# Patient Record
Sex: Male | Born: 1960 | Race: White | Hispanic: No | Smoking: Former smoker
Health system: Southern US, Community
[De-identification: ages and names within clinical notes are randomized; demographics above are authoritative.]

## PROBLEM LIST (undated history)

## (undated) DIAGNOSIS — K279 Peptic ulcer, site unspecified, unspecified as acute or chronic, without hemorrhage or perforation: Secondary | ICD-10-CM

## (undated) DIAGNOSIS — J45909 Unspecified asthma, uncomplicated: Secondary | ICD-10-CM

## (undated) DIAGNOSIS — I671 Cerebral aneurysm, nonruptured: Secondary | ICD-10-CM

## (undated) DIAGNOSIS — C3490 Malignant neoplasm of unspecified part of unspecified bronchus or lung: Secondary | ICD-10-CM

## (undated) HISTORY — DX: Malignant neoplasm of unspecified part of unspecified bronchus or lung: C34.90

## (undated) HISTORY — DX: Peptic ulcer, site unspecified, unspecified as acute or chronic, without hemorrhage or perforation: K27.9

## (undated) HISTORY — PX: BRONCHOSCOPY: SUR163

## (undated) HISTORY — DX: Unspecified asthma, uncomplicated: J45.909

---

## 2005-04-19 ENCOUNTER — Encounter: Payer: Self-pay | Admitting: Emergency Medicine

## 2005-04-19 ENCOUNTER — Inpatient Hospital Stay (HOSPITAL_COMMUNITY): Admission: EM | Admit: 2005-04-19 | Discharge: 2005-05-01 | Payer: Self-pay | Admitting: Neurosurgery

## 2005-05-23 ENCOUNTER — Ambulatory Visit (HOSPITAL_COMMUNITY): Admission: RE | Admit: 2005-05-23 | Discharge: 2005-05-23 | Payer: Self-pay | Admitting: Neurosurgery

## 2005-12-26 IMAGING — CT CT HEAD W/O CM
1 series · 16 of 30 positions shown, 20 images · IV contrast (agent unspecified)
Comparison: Comparison is made with 04/29/05 study.

CLINICAL DATA: Follow up subarachnoid hemorrhage.
 HEAD CT WITHOUT CONTRAST:
TECHNIQUE: 5mm collimated images were obtained from the base of the skull through the vertex according to standard protocol without contrast.

[Series 2: brain · axial · 0.47mm/px · z∈[+148,+284]mm · 16 of 30 slices shown, 20 images]
[im 2/30  brain]
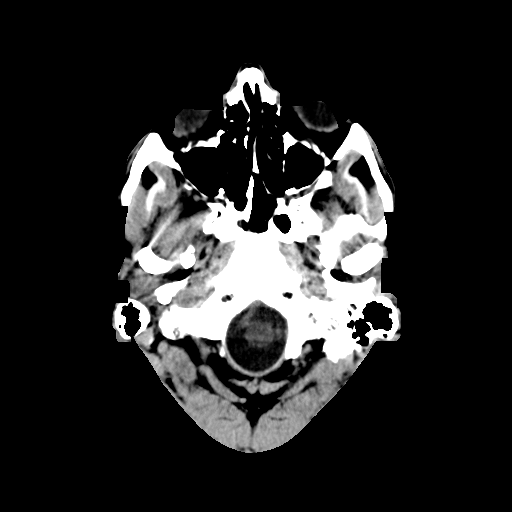
[im 2/30  bone]
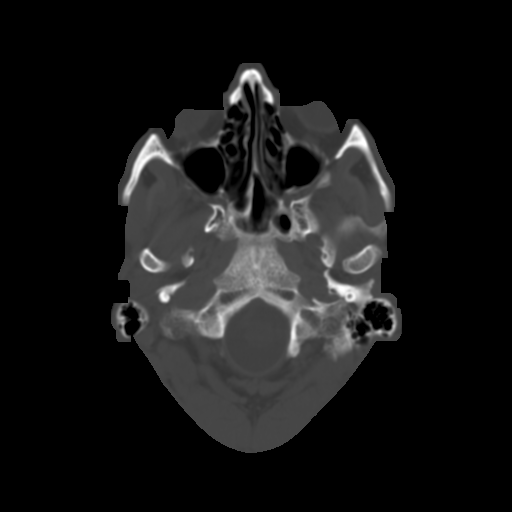
[im 4/30  brain]
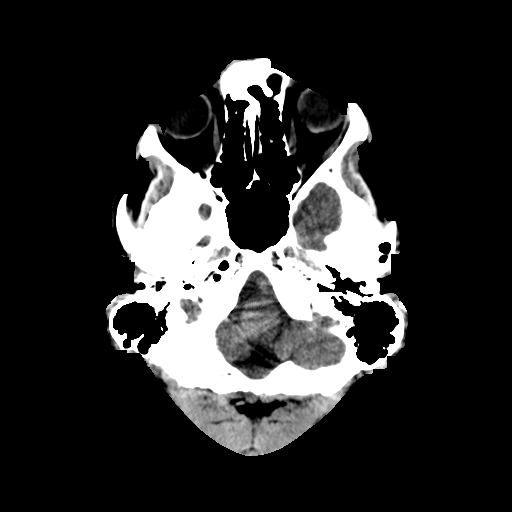
[im 6/30  brain]
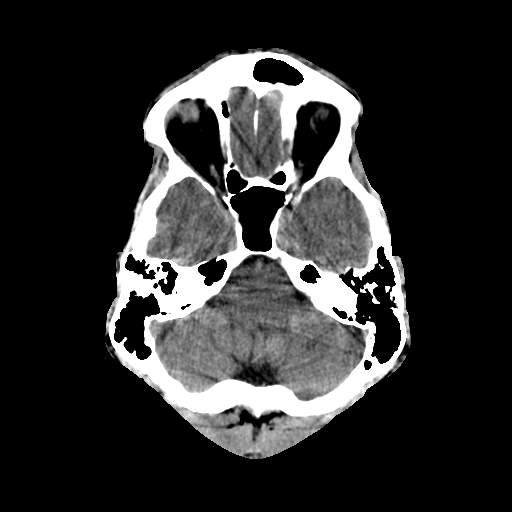
[im 8/30  brain]
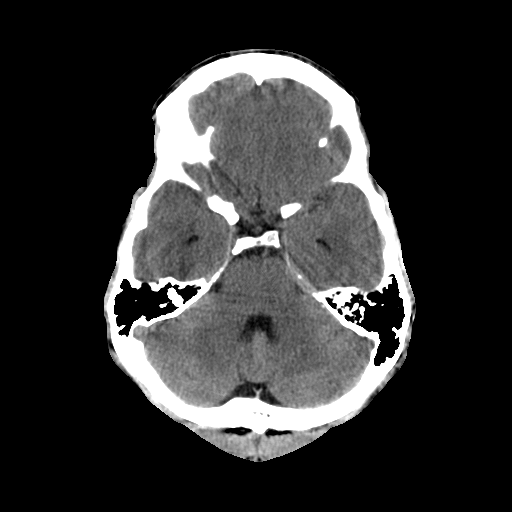
[im 9/30  brain]
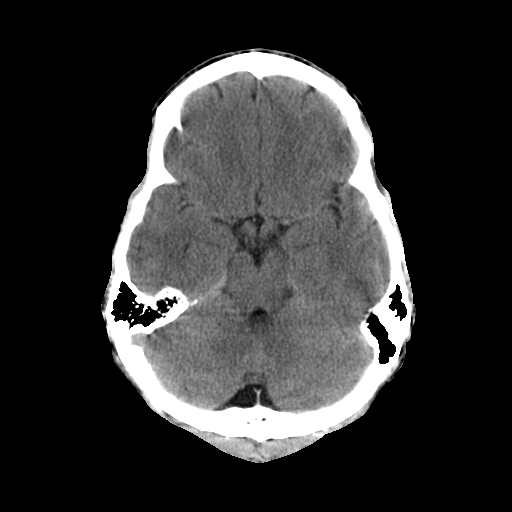
[im 9/30  bone]
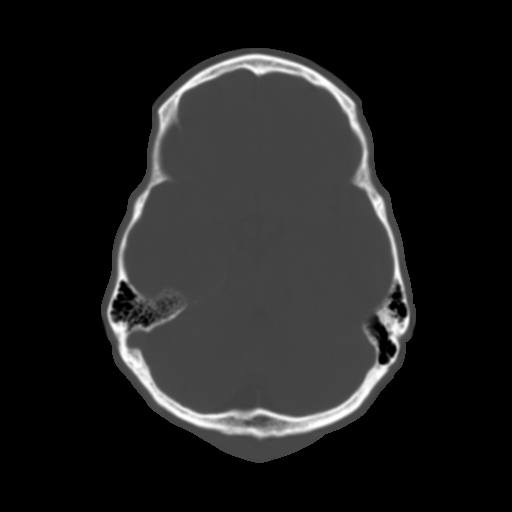
[im 11/30  brain]
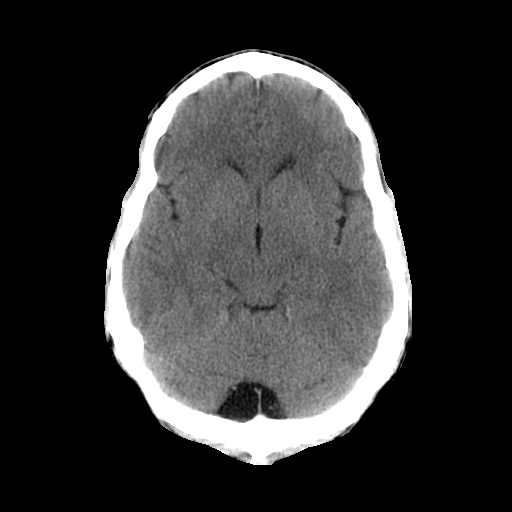
[im 13/30  brain]
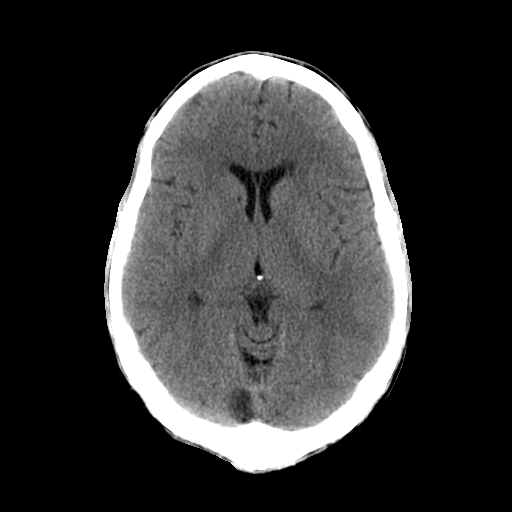
[im 15/30  brain]
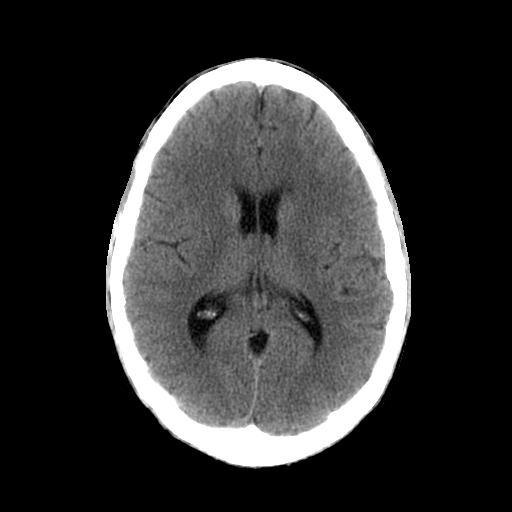
[im 16/30  brain]
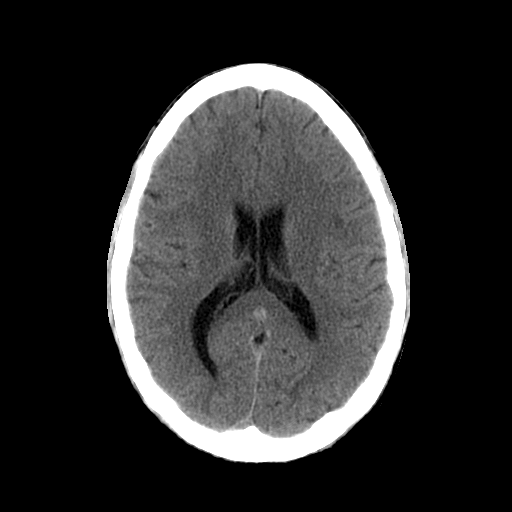
[im 16/30  bone]
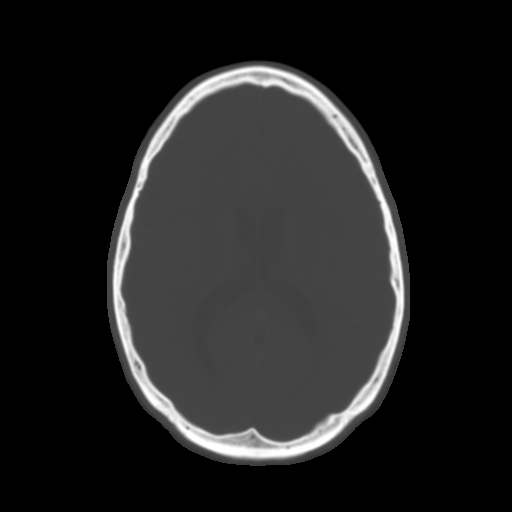
[im 18/30  brain]
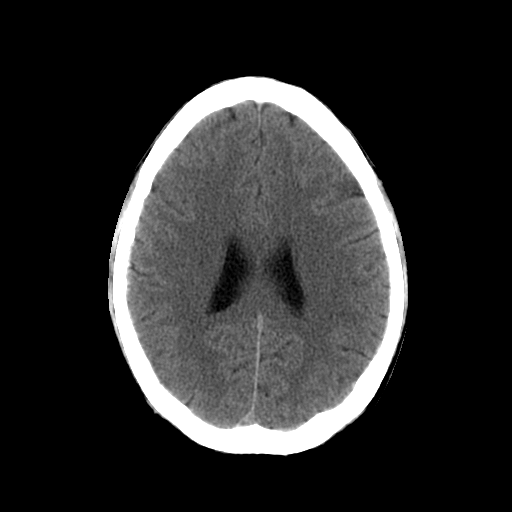
[im 20/30  brain]
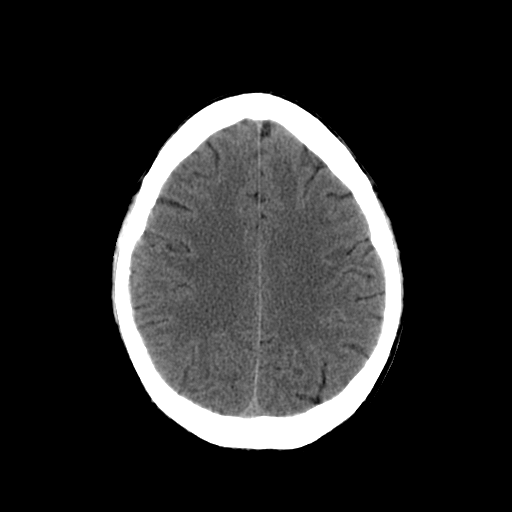
[im 22/30  brain]
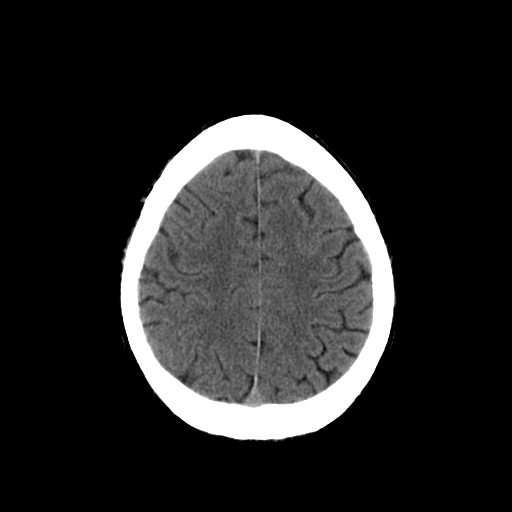
[im 23/30  brain]
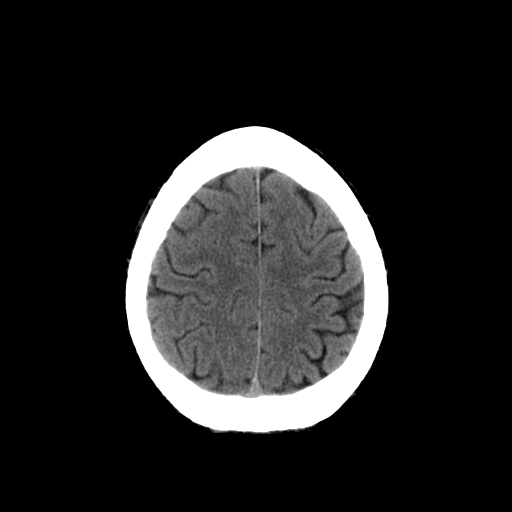
[im 23/30  bone]
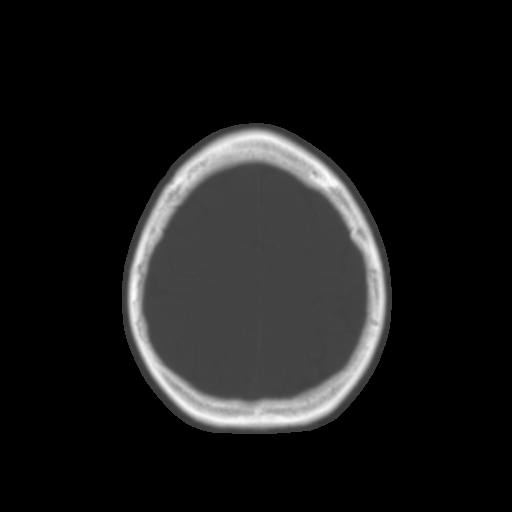
[im 25/30  brain]
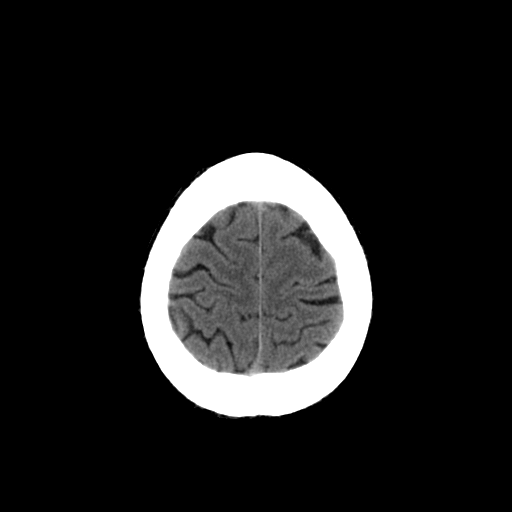
[im 27/30  brain]
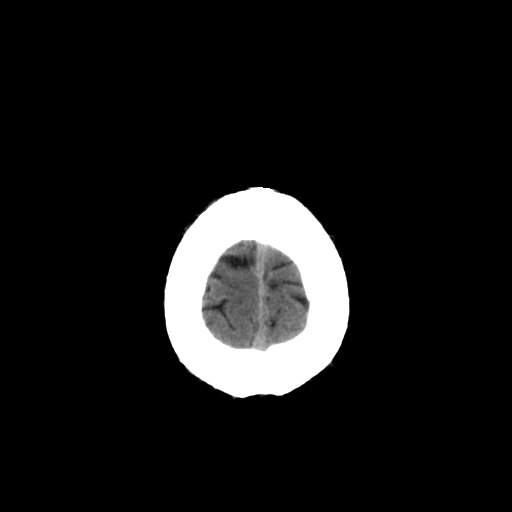
[im 29/30  brain]
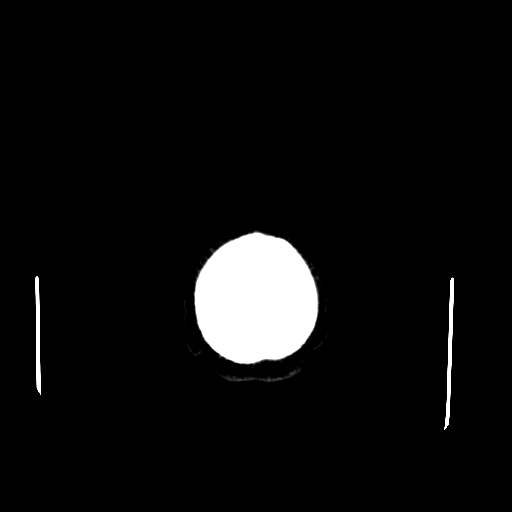

[16 of 30 positions shown; findings below may reference images not displayed]

There is no subarachnoid or intraventricular hemorrhage present on today?s study.  There is no midline shifts or mass effects.  The ventricles are normal in size and contour.  The paranasal sinuses as visualized have a normal appearance.
IMPRESSION: No evidence for recurrent subarachnoid or intraventricular hemorrhage.  No acute intracranial finding.

## 2019-05-11 ENCOUNTER — Emergency Department (HOSPITAL_COMMUNITY): Payer: Self-pay

## 2019-05-11 ENCOUNTER — Encounter (HOSPITAL_COMMUNITY): Payer: Self-pay | Admitting: Emergency Medicine

## 2019-05-11 ENCOUNTER — Inpatient Hospital Stay (HOSPITAL_COMMUNITY)
Admission: EM | Admit: 2019-05-11 | Discharge: 2019-05-17 | DRG: 871 | Disposition: A | Discharge status: Home / Self Care | Payer: Self-pay | Attending: Internal Medicine | Admitting: Internal Medicine

## 2019-05-11 ENCOUNTER — Other Ambulatory Visit: Payer: Self-pay

## 2019-05-11 DIAGNOSIS — Z20828 Contact with and (suspected) exposure to other viral communicable diseases: Secondary | ICD-10-CM | POA: Diagnosis present

## 2019-05-11 DIAGNOSIS — E46 Unspecified protein-calorie malnutrition: Secondary | ICD-10-CM

## 2019-05-11 DIAGNOSIS — A403 Sepsis due to Streptococcus pneumoniae: Principal | ICD-10-CM | POA: Diagnosis present

## 2019-05-11 DIAGNOSIS — Z6820 Body mass index (BMI) 20.0-20.9, adult: Secondary | ICD-10-CM

## 2019-05-11 DIAGNOSIS — R824 Acetonuria: Secondary | ICD-10-CM | POA: Diagnosis present

## 2019-05-11 DIAGNOSIS — E875 Hyperkalemia: Secondary | ICD-10-CM | POA: Diagnosis not present

## 2019-05-11 DIAGNOSIS — J9801 Acute bronchospasm: Secondary | ICD-10-CM

## 2019-05-11 DIAGNOSIS — E861 Hypovolemia: Secondary | ICD-10-CM | POA: Diagnosis present

## 2019-05-11 DIAGNOSIS — J181 Lobar pneumonia, unspecified organism: Secondary | ICD-10-CM

## 2019-05-11 DIAGNOSIS — J189 Pneumonia, unspecified organism: Secondary | ICD-10-CM | POA: Diagnosis present

## 2019-05-11 DIAGNOSIS — E860 Dehydration: Secondary | ICD-10-CM | POA: Diagnosis present

## 2019-05-11 DIAGNOSIS — F17210 Nicotine dependence, cigarettes, uncomplicated: Secondary | ICD-10-CM | POA: Diagnosis present

## 2019-05-11 DIAGNOSIS — R7881 Bacteremia: Secondary | ICD-10-CM

## 2019-05-11 DIAGNOSIS — E871 Hypo-osmolality and hyponatremia: Secondary | ICD-10-CM

## 2019-05-11 DIAGNOSIS — R319 Hematuria, unspecified: Secondary | ICD-10-CM | POA: Diagnosis present

## 2019-05-11 DIAGNOSIS — E43 Unspecified severe protein-calorie malnutrition: Secondary | ICD-10-CM | POA: Diagnosis present

## 2019-05-11 DIAGNOSIS — B953 Streptococcus pneumoniae as the cause of diseases classified elsewhere: Secondary | ICD-10-CM

## 2019-05-11 DIAGNOSIS — Z720 Tobacco use: Secondary | ICD-10-CM

## 2019-05-11 DIAGNOSIS — R809 Proteinuria, unspecified: Secondary | ICD-10-CM | POA: Diagnosis present

## 2019-05-11 DIAGNOSIS — J440 Chronic obstructive pulmonary disease with acute lower respiratory infection: Secondary | ICD-10-CM | POA: Diagnosis present

## 2019-05-11 HISTORY — DX: Cerebral aneurysm, nonruptured: I67.1

## 2019-05-11 LAB — CBC WITH DIFFERENTIAL/PLATELET
Abs Immature Granulocytes: 0.08 10*3/uL — ABNORMAL HIGH (ref 0.00–0.07)
Basophils Absolute: 0 10*3/uL (ref 0.0–0.1)
Basophils Relative: 0 %
Eosinophils Absolute: 0.1 10*3/uL (ref 0.0–0.5)
Eosinophils Relative: 1 %
HCT: 35 % — ABNORMAL LOW (ref 39.0–52.0)
Hemoglobin: 11.5 g/dL — ABNORMAL LOW (ref 13.0–17.0)
Immature Granulocytes: 1 %
Lymphocytes Relative: 10 %
Lymphs Abs: 1.6 10*3/uL (ref 0.7–4.0)
MCH: 29.6 pg (ref 26.0–34.0)
MCHC: 32.9 g/dL (ref 30.0–36.0)
MCV: 90 fL (ref 80.0–100.0)
Monocytes Absolute: 1 10*3/uL (ref 0.1–1.0)
Monocytes Relative: 6 %
Neutro Abs: 13.7 10*3/uL — ABNORMAL HIGH (ref 1.7–7.7)
Neutrophils Relative %: 82 %
Platelets: 274 10*3/uL (ref 150–400)
RBC: 3.89 MIL/uL — ABNORMAL LOW (ref 4.22–5.81)
RDW: 13.8 % (ref 11.5–15.5)
WBC: 16.5 10*3/uL — ABNORMAL HIGH (ref 4.0–10.5)
nRBC: 0 % (ref 0.0–0.2)

## 2019-05-11 LAB — COMPREHENSIVE METABOLIC PANEL
ALT: 17 U/L (ref 0–44)
AST: 21 U/L (ref 15–41)
Albumin: 2.6 g/dL — ABNORMAL LOW (ref 3.5–5.0)
Alkaline Phosphatase: 69 U/L (ref 38–126)
Anion gap: 12 (ref 5–15)
BUN: 15 mg/dL (ref 6–20)
CO2: 23 mmol/L (ref 22–32)
Calcium: 8.1 mg/dL — ABNORMAL LOW (ref 8.9–10.3)
Chloride: 94 mmol/L — ABNORMAL LOW (ref 98–111)
Creatinine, Ser: 1.14 mg/dL (ref 0.61–1.24)
GFR calc Af Amer: >60 mL/min (ref >60)
GFR calc non Af Amer: >60 mL/min (ref >60)
Glucose, Bld: 103 mg/dL — ABNORMAL HIGH (ref 70–99)
Potassium: 3.8 mmol/L (ref 3.5–5.1)
Sodium: 129 mmol/L — ABNORMAL LOW (ref 135–145)
Total Bilirubin: 1.3 mg/dL — ABNORMAL HIGH (ref 0.3–1.2)
Total Protein: 6.1 g/dL — ABNORMAL LOW (ref 6.5–8.1)

## 2019-05-11 LAB — URINALYSIS, ROUTINE W REFLEX MICROSCOPIC
Bacteria, UA: NONE SEEN
Bilirubin Urine: NEGATIVE
Glucose, UA: NEGATIVE mg/dL
Hgb urine dipstick: NEGATIVE
Ketones, ur: 5 mg/dL — AB
Leukocytes,Ua: NEGATIVE
Nitrite: NEGATIVE
Protein, ur: 30 mg/dL — AB
Specific Gravity, Urine: 1.021 (ref 1.005–1.030)
pH: 5 (ref 5.0–8.0)

## 2019-05-11 LAB — LACTIC ACID, PLASMA: Lactic Acid, Venous: 1.2 mmol/L (ref 0.5–1.9)

## 2019-05-11 LAB — LIPASE, BLOOD: Lipase: 30 U/L (ref 11–51)

## 2019-05-11 LAB — SARS CORONAVIRUS 2 BY RT PCR (HOSPITAL ORDER, PERFORMED IN ~~LOC~~ HOSPITAL LAB): SARS Coronavirus 2: NEGATIVE

## 2019-05-11 MED ORDER — ENOXAPARIN SODIUM 40 MG/0.4ML ~~LOC~~ SOLN
40.0000 mg | SUBCUTANEOUS | Status: DC
  Administered 2019-05-12 – 2019-05-16 (×5): 40 mg via SUBCUTANEOUS
  Filled 2019-05-11 (×6): qty 0.4

## 2019-05-11 MED ORDER — SODIUM CHLORIDE 0.9 % IV SOLN
1.0000 g | INTRAVENOUS | Status: DC
  Administered 2019-05-11: 1 g via INTRAVENOUS
  Filled 2019-05-11 (×2): qty 10

## 2019-05-11 MED ORDER — SODIUM CHLORIDE 0.9% FLUSH
3.0000 mL | Freq: Two times a day (BID) | INTRAVENOUS | Status: DC
  Administered 2019-05-11 – 2019-05-16 (×9): 3 mL via INTRAVENOUS

## 2019-05-11 MED ORDER — ACETAMINOPHEN 325 MG PO TABS
650.0000 mg | ORAL_TABLET | Freq: Four times a day (QID) | ORAL | Status: DC | PRN
  Administered 2019-05-12 – 2019-05-16 (×2): 650 mg via ORAL
  Filled 2019-05-11 (×2): qty 2

## 2019-05-11 MED ORDER — VANCOMYCIN HCL IN DEXTROSE 1-5 GM/200ML-% IV SOLN: 1000.0000 mg | Freq: Two times a day (BID) | INTRAVENOUS | Status: DC

## 2019-05-11 MED ORDER — VANCOMYCIN HCL IN DEXTROSE 1-5 GM/200ML-% IV SOLN: 1000.0000 mg | Freq: Once | INTRAVENOUS | Status: DC

## 2019-05-11 MED ORDER — SODIUM CHLORIDE 0.9 % IV BOLUS (SEPSIS)
1000.0000 mL | Freq: Once | INTRAVENOUS | Status: AC
  Administered 2019-05-11: 1000 mL via INTRAVENOUS

## 2019-05-11 MED ORDER — SENNOSIDES-DOCUSATE SODIUM 8.6-50 MG PO TABS: 1.0000 | ORAL_TABLET | Freq: Every evening | ORAL | Status: DC | PRN

## 2019-05-11 MED ORDER — MORPHINE SULFATE (PF) 2 MG/ML IV SOLN
2.0000 mg | Freq: Once | INTRAVENOUS | Status: AC
  Administered 2019-05-11: 2 mg via INTRAVENOUS
  Filled 2019-05-11: qty 1

## 2019-05-11 MED ORDER — VANCOMYCIN HCL 10 G IV SOLR
1500.0000 mg | Freq: Once | INTRAVENOUS | Status: AC
  Administered 2019-05-11: 1500 mg via INTRAVENOUS
  Filled 2019-05-11: qty 1500

## 2019-05-11 MED ORDER — METRONIDAZOLE IN NACL 5-0.79 MG/ML-% IV SOLN
500.0000 mg | Freq: Once | INTRAVENOUS | Status: AC
  Administered 2019-05-11: 500 mg via INTRAVENOUS
  Filled 2019-05-11: qty 100

## 2019-05-11 MED ORDER — SODIUM CHLORIDE 0.9 % IV SOLN
2.0000 g | Freq: Once | INTRAVENOUS | Status: AC
  Administered 2019-05-11: 2 g via INTRAVENOUS
  Filled 2019-05-11: qty 2

## 2019-05-11 MED ORDER — SODIUM CHLORIDE 0.9 % IV BOLUS (SEPSIS)
500.0000 mL | Freq: Once | INTRAVENOUS | Status: AC
  Administered 2019-05-11: 500 mL via INTRAVENOUS

## 2019-05-11 MED ORDER — SODIUM CHLORIDE 0.9 % IV SOLN
500.0000 mg | INTRAVENOUS | Status: DC
  Administered 2019-05-11 – 2019-05-12 (×2): 500 mg via INTRAVENOUS
  Filled 2019-05-11 (×3): qty 500

## 2019-05-11 MED ORDER — ONDANSETRON HCL 4 MG/2ML IJ SOLN: 4.0000 mg | Freq: Four times a day (QID) | INTRAMUSCULAR | Status: DC | PRN

## 2019-05-11 MED ORDER — ACETAMINOPHEN 650 MG RE SUPP: 650.0000 mg | Freq: Four times a day (QID) | RECTAL | Status: DC | PRN

## 2019-05-11 MED ORDER — SODIUM CHLORIDE 0.9% FLUSH
3.0000 mL | INTRAVENOUS | Status: DC | PRN
  Administered 2019-05-16: 3 mL via INTRAVENOUS
  Filled 2019-05-11: qty 3

## 2019-05-11 MED ORDER — KETOROLAC TROMETHAMINE 15 MG/ML IJ SOLN
15.0000 mg | Freq: Four times a day (QID) | INTRAMUSCULAR | Status: DC | PRN
  Administered 2019-05-14: 15 mg via INTRAVENOUS
  Filled 2019-05-11: qty 1

## 2019-05-11 MED ORDER — SODIUM CHLORIDE 0.9 % IV SOLN: 2.0000 g | Freq: Three times a day (TID) | INTRAVENOUS | Status: DC

## 2019-05-11 MED ORDER — SODIUM CHLORIDE 0.9 % IV SOLN: 250.0000 mL | INTRAVENOUS | Status: DC | PRN

## 2019-05-11 MED ORDER — ONDANSETRON HCL 4 MG PO TABS: 4.0000 mg | ORAL_TABLET | Freq: Four times a day (QID) | ORAL | Status: DC | PRN

## 2019-05-11 MED ORDER — HYDROCODONE-ACETAMINOPHEN 5-325 MG PO TABS
1.0000 | ORAL_TABLET | ORAL | Status: DC | PRN
  Administered 2019-05-11 – 2019-05-14 (×3): 2 via ORAL
  Administered 2019-05-14: 1 via ORAL
  Administered 2019-05-14 – 2019-05-15 (×2): 2 via ORAL
  Administered 2019-05-16: 1 via ORAL
  Administered 2019-05-16: 2 via ORAL
  Filled 2019-05-11 (×4): qty 2
  Filled 2019-05-11 (×2): qty 1
  Filled 2019-05-11 (×2): qty 2

## 2019-05-11 NOTE — ED Provider Notes (Signed)
[facility] EMERGENCY DEPARTMENT Provider Note   CSN: [id number] Arrival date & time: 05/11/19  1603    History   Chief Complaint Chief Complaint  Patient presents with  . Fall    HPI Randall Valencia is a 58 y.o. male.     HPI  58 year old male presents via [facility] with reports of fall several days ago and fever.  Patient states he fell into a hole while working.  He states 1 leg went to the whole and the other leg stayed up.  He has had pain in his groin and the back of the other leg with standing since that time.  He states that he has been mostly staying in his truck since that time due to pain with walking.  He has been drinking lots of water.  He states he went to a friend's house today and they called EMS due to his pain.  He had not noted that he had a fever but EMS had noted a fever prehospital.  He has a cough with some sputum production.  He states that he is a smoker and often has a cough.  He denies any other infectious symptoms.  He states that he has had an aneurysm in the past but denies any current headache or head injury.  He denies any vision change, neck pain, chest pain, abdominal pain, nausea, vomiting, or diarrhea.  He states he has had some blood in his urine.  Denies any exposure to COVID-19, or travel.  No past medical history on file.  There are no active problems to display for this patient.  ho cerebral aneurysm   Home Medications    Prior to Admission medications   Not on File    Family History No family history on file.  Social History Social History   Tobacco Use  . Smoking status: Current Every Day Smoker  Substance Use Topics  . Alcohol use: Not on file  . Drug use: Not on file     Allergies   Patient has no allergy information on record.   Review of Systems Review of Systems  All other systems reviewed and are negative.    Physical Exam Updated Vital Signs Ht 1.676 m (5\' 6" )   SpO2 99%    Physical Exam Vitals signs and nursing note reviewed.  Constitutional:      General: He is not in acute distress.    Appearance: Normal appearance. He is normal weight. He is not ill-appearing.  HENT:     Head: Normocephalic and atraumatic.     Right Ear: External ear normal.     Left Ear: External ear normal.     Mouth/Throat:     Mouth: Mucous membranes are moist.     Pharynx: Oropharynx is clear.  Eyes:     Extraocular Movements: Extraocular movements intact.     Pupils: Pupils are equal, round, and reactive to light.  Neck:     Musculoskeletal: Normal range of motion and neck supple.  Cardiovascular:     Rate and Rhythm: Normal rate and regular rhythm.  Pulmonary:     Effort: Pulmonary effort is normal.     Breath sounds: Normal breath sounds.  Abdominal:     General: Abdomen is flat.     Palpations: Abdomen is soft.  Musculoskeletal: Normal range of motion.        General: Tenderness present. No swelling or deformity.  Skin:    General: Skin is warm.  Capillary Refill: Capillary refill takes less than 2 seconds.  Neurological:     General: No focal deficit present.     Mental Status: He is alert.     Comments: Patient oriented to place and year but not to month or day      ED Treatments / Results  Labs (all labs ordered are listed, but only abnormal results are displayed) Labs Reviewed  CULTURE, BLOOD (ROUTINE X 2)  CULTURE, BLOOD (ROUTINE X 2)  SARS CORONAVIRUS 2 (HOSPITAL ORDER, [facility])  LACTIC ACID, PLASMA  LACTIC ACID, PLASMA  COMPREHENSIVE METABOLIC PANEL  CBC WITH DIFFERENTIAL/PLATELET  URINALYSIS, ROUTINE W REFLEX MICROSCOPIC  LIPASE, BLOOD    EKG None  Radiology Dg Pelvis Portable  Result Date: 05/11/2019 CLINICAL DATA:  Low back and right leg pain following a fall 5 days ago. EXAM: PORTABLE PELVIS 1-2 VIEWS COMPARISON:  None. FINDINGS: Normal appearing pelvic bones and hips with no fracture or dislocation.  Lower lumbar spine degenerative changes and mild scoliosis. IMPRESSION: No fracture or dislocation. Electronically Signed   By: Claudie Revering M.D.   On: 05/11/2019 17:15   Dg Chest Port 1 View  Result Date: 05/11/2019 CLINICAL DATA:  Cough and fever. Recent fall. EXAM: PORTABLE CHEST 1 VIEW COMPARISON:  04/19/2005. FINDINGS: Normal sized heart. Patchy airspace opacity and pleural fluid at the left lung base. Clear right lung. Unremarkable bones. IMPRESSION: Left basilar pneumonia and small left pleural effusion. Electronically Signed   By: Claudie Revering M.D.   On: 05/11/2019 17:14   Dg Femur Min 2 Views Left  Result Date: 05/11/2019 CLINICAL DATA:  Pain secondary to a fall 3 days ago. EXAM: LEFT FEMUR 2 VIEWS COMPARISON:  None. FINDINGS: There is no evidence of fracture or other focal bone lesions. Soft tissues are unremarkable. IMPRESSION: Negative. Electronically Signed   By: Lorriane Shire M.D.   On: 05/11/2019 17:26   Dg Femur Min 2 Views Right  Result Date: 05/11/2019 CLINICAL DATA:  Low back and right leg pain following a fall 5 days ago. EXAM: RIGHT FEMUR 2 VIEWS COMPARISON:  None. FINDINGS: There is no evidence of fracture or other focal bone lesions. Soft tissues are unremarkable. IMPRESSION: No fracture. Electronically Signed   By: Claudie Revering M.D.   On: 05/11/2019 17:20    Procedures Procedures (including critical care time)  Medications Ordered in ED Medications  ceFEPIme (MAXIPIME) 2 g in sodium chloride 0.9 % 100 mL IVPB (has no administration in time range)  metroNIDAZOLE (FLAGYL) IVPB 500 mg (has no administration in time range)  vancomycin (VANCOCIN) IVPB 1000 mg/200 mL premix (has no administration in time range)  sodium chloride 0.9 % bolus 1,000 mL (has no administration in time range)    And  sodium chloride 0.9 % bolus 1,000 mL (has no administration in time range)    And  sodium chloride 0.9 % bolus 500 mL (has no administration in time range)     Initial Impression  / Assessment and Plan / ED Course  I have reviewed the triage vital signs and the nursing notes.  Pertinent labs & imaging results that were available during my care of the patient were reviewed by me and considered in my medical decision making (see chart for details).    58 year old male who presents today complaining of fall several days ago, generalized weakness, cough and dyspnea.  Here on evaluation patient is hemodynamically stable with some tachypnea with normal oxygen saturations.  Work-up revealed  a level of left lower lobe infiltrate, leukocytosis, hyponatremia.  He is receiving IV fluids and broad-spectrum antibiotics. He was evaluated due to the fall with x-rays of his pelvis, bilateral femurs, head CT, and CT of lumbar spine. 1 left lower lobe pneumonia 2 2 follow-up with back and leg pain 3 history of aneurysm no evidence of acute bleed on head CT Final Clinical Impressions(s) / ED Diagnoses   Final diagnoses:  None    ED Discharge Orders    None       Pattricia Boss, MD 05/11/19 2343

## 2019-05-11 NOTE — ED Triage Notes (Signed)
Pt arrives via EMS from home with reports of fall 5 days ago. Pain to lower back and right leg. Endorses blood in urine and states he has not eaten. EMS reports fever of 100.7. 50 mcg fentanyl given. Pt states he has been in his truck for the past 3 days cause he was unable to get out.

## 2019-05-11 NOTE — H&P (Signed)
History and Physical    Riaz Onorato WUJ:[id number] DOB: 12-19-1960 DOA: 05/11/2019  PCP: Patient, No Pcp Per   Patient coming from: Home   Chief Complaint: Fall with back and leg pain, malaise, fever   HPI: Dionte Blaustein is a 58 y.o. male with medical history significant for cerebral aneurysm, reports that he has not seen a physician in several years, and now presents for evaluation of generalized weakness, malaise, fever, and pain in his low back and right leg after a fall a few days ago.  The patient reports a chronic cough that he attributes to smoking, but has developed some shortness of breath over the past few days.  His main concern is generalized weakness and loss of appetite.  He denies any headache, change in vision or hearing, or focal numbness or weakness.  Denies any alcohol use.  He fell approximately 5 days ago, states that he felt generally weak, but does not believe that he hit his head or loss consciousness.  Weakness and malaise has continued to worsen, prompting his presentation tonight.  ED Course: Upon arrival to the ED, patient is found to be tachypneic, saturating adequately on room air, and with stable blood pressure.  EKG features a sinus rhythm and chest x-ray is concerning for left basilar pneumonia with small pleural effusion.  Noncontrast head CT is negative for acute intracranial abnormality and CT lumbar spine is also negative for acute pathology.  Radiographs of the bilateral femurs and pelvis are negative.  Chemistry panel is notable for sodium of 129 and albumin 2.6.  CBC features a leukocytosis to 16,500.  Urinalysis notable for proteinuria and ketonuria.  Lactic acid is reassuringly normal.  Blood cultures were collected and the patient was given 30 cc/kg normal saline bolus, vancomycin, cefepime, and Flagyl in the ED.  He remains hemodynamically stable and will be observed for ongoing evaluation and management of pneumonia.  Review of Systems:  All other systems  reviewed and apart from HPI, are negative.  Past Medical History:  Diagnosis Date  . Cerebral aneurysm     History reviewed. No pertinent surgical history.   reports that he has been smoking. He has never used smokeless tobacco. He reports previous alcohol use. He reports previous drug use.  Not on File  History reviewed. No pertinent family history.   Prior to Admission medications   Not on File    Physical Exam: Vitals:   05/11/19 1731 05/11/19 1745 05/11/19 1800 05/11/19 1845  BP:  119/63 (!) 129/111 111/66  Pulse:   92 88  Resp:  (!) 26 (!) 27 (!) 24  SpO2:   98% 98%  Weight: 77.1 kg     Height: 6\' 2"  (1.88 m)       Constitutional: NAD, cachectic   Eyes: PERTLA, lids and conjunctivae normal ENMT: Mucous membranes are moist. Posterior pharynx clear of any exudate or lesions.   Neck: normal, supple, no masses, no thyromegaly Respiratory: Rhonchi on left. no wheezing, no crackles.  No accessory muscle use.  Cardiovascular: S1 & S2 heard, regular rate and rhythm. No extremity edema.  Abdomen: No distension, no tenderness, soft. Bowel sounds normal.  Musculoskeletal: no clubbing / cyanosis. No joint deformity upper and lower extremities.    Skin: no significant rashes, lesions, ulcers. Warm, dry, well-perfused. Neurologic: CN 2-12 grossly intact. Sensation intact. Strength 5/5 in all 4 limbs.  Psychiatric: Alert and oriented to person, place, and situation. Pleasant, cooperative.    Labs on Admission: I have personally  reviewed following labs and imaging studies  CBC: Recent Labs  Lab 05/11/19 1700  WBC 16.5*  NEUTROABS 13.7*  HGB 11.5*  HCT 35.0*  MCV 90.0  PLT 756   Basic Metabolic Panel: Recent Labs  Lab 05/11/19 1700  NA 129*  K 3.8  CL 94*  CO2 23  GLUCOSE 103*  BUN 15  CREATININE 1.14  CALCIUM 8.1*   GFR: Estimated Creatinine Clearance: 76.1 mL/min (by C-G formula based on SCr of 1.14 mg/dL). Liver Function Tests: Recent Labs  Lab  05/11/19 1700  AST 21  ALT 17  ALKPHOS 69  BILITOT 1.3*  PROT 6.1*  ALBUMIN 2.6*   Recent Labs  Lab 05/11/19 1700  LIPASE 30   No results for input(s): AMMONIA in the last 168 hours. Coagulation Profile: No results for input(s): INR, PROTIME in the last 168 hours. Cardiac Enzymes: No results for input(s): CKTOTAL, CKMB, CKMBINDEX, TROPONINI in the last 168 hours. BNP (last 3 results) No results for input(s): PROBNP in the last 8760 hours. HbA1C: No results for input(s): HGBA1C in the last 72 hours. CBG: No results for input(s): GLUCAP in the last 168 hours. Lipid Profile: No results for input(s): CHOL, HDL, LDLCALC, TRIG, CHOLHDL, LDLDIRECT in the last 72 hours. Thyroid Function Tests: No results for input(s): TSH, T4TOTAL, FREET4, T3FREE, THYROIDAB in the last 72 hours. Anemia Panel: No results for input(s): VITAMINB12, FOLATE, FERRITIN, TIBC, IRON, RETICCTPCT in the last 72 hours. Urine analysis:    Component Value Date/Time   COLORURINE AMBER (A) 05/11/2019 1745   APPEARANCEUR CLEAR 05/11/2019 1745   LABSPEC 1.021 05/11/2019 1745   PHURINE 5.0 05/11/2019 1745   GLUCOSEU NEGATIVE 05/11/2019 1745   HGBUR NEGATIVE 05/11/2019 1745   BILIRUBINUR NEGATIVE 05/11/2019 1745   KETONESUR 5 (A) 05/11/2019 1745   PROTEINUR 30 (A) 05/11/2019 1745   NITRITE NEGATIVE 05/11/2019 1745   LEUKOCYTESUR NEGATIVE 05/11/2019 1745   Sepsis Labs: @LABRCNTIP (procalcitonin:4,lacticidven:4) ) Recent Results (from the past 240 hour(s))  SARS Coronavirus 2 (CEPHEID- Performed in [facility] hospital lab), Hosp Order     Status: None   Collection Time: 05/11/19  5:02 PM  Result Value Ref Range Status   SARS Coronavirus 2 NEGATIVE NEGATIVE Final    Comment: (NOTE) If result is NEGATIVE SARS-CoV-2 target nucleic acids are NOT DETECTED. The SARS-CoV-2 RNA is generally detectable in upper and lower  respiratory specimens during the acute phase of infection. The lowest  concentration of  SARS-CoV-2 viral copies this assay can detect is 250  copies / mL. A negative result does not preclude SARS-CoV-2 infection  and should not be used as the sole basis for treatment or other  patient management decisions.  A negative result may occur with  improper specimen collection / handling, submission of specimen other  than nasopharyngeal swab, presence of viral mutation(s) within the  areas targeted by this assay, and inadequate number of viral copies  (<250 copies / mL). A negative result must be combined with clinical  observations, patient history, and epidemiological information. If result is POSITIVE SARS-CoV-2 target nucleic acids are DETECTED. The SARS-CoV-2 RNA is generally detectable in upper and lower  respiratory specimens dur ing the acute phase of infection.  Positive  results are indicative of active infection with SARS-CoV-2.  Clinical  correlation with patient history and other diagnostic information is  necessary to determine patient infection status.  Positive results do  not rule out bacterial infection or co-infection with other viruses. If result is PRESUMPTIVE POSTIVE  SARS-CoV-2 nucleic acids MAY BE PRESENT.   A presumptive positive result was obtained on the submitted specimen  and confirmed on repeat testing.  While 2019 novel coronavirus  (SARS-CoV-2) nucleic acids may be present in the submitted sample  additional confirmatory testing may be necessary for epidemiological  and / or clinical management purposes  to differentiate between  SARS-CoV-2 and other Sarbecovirus currently known to infect humans.  If clinically indicated additional testing with an alternate test  methodology [phone]) is advised. The SARS-CoV-2 RNA is generally  detectable in upper and lower respiratory sp ecimens during the acute  phase of infection. The expected result is Negative. Fact Sheet for Patients:  StrictlyIdeas.no Fact Sheet for Healthcare  Providers: BankingDealers.co.za This test is not yet approved or cleared by the [place] FDA and has been authorized for detection and/or diagnosis of SARS-CoV-2 by FDA under an Emergency Use Authorization (EUA).  This EUA will remain in effect (meaning this test can be used) for the duration of the COVID-19 declaration under Section 564(b)(1) of the Act, 21 U.S.C. section 360bbb-3(b)(1), unless the authorization is terminated or revoked sooner. Performed at [facility], [place] [address] [place], [place] [address]      Radiological Exams on Admission: Ct Head Wo Contrast  Result Date: 05/11/2019 CLINICAL DATA:  58 year old male with head trauma. EXAM: CT HEAD WITHOUT CONTRAST TECHNIQUE: Contiguous axial images were obtained from the base of the skull through the vertex without intravenous contrast. COMPARISON:  Head CT dated 05/23/2005 FINDINGS: Brain: The ventricles and sulci appropriate size for patient's age. Minimal periventricular and deep white matter chronic microvascular ischemic changes noted. There is no acute intracranial hemorrhage. No mass effect or midline shift. No extra-axial fluid collection. Vascular: No hyperdense vessel or unexpected calcification. Skull: Normal. Negative for fracture or focal lesion. Sinuses/Orbits: No acute finding. Other: None IMPRESSION: No acute intracranial pathology. Electronically Signed   By: Anner Crete M.D.   On: 05/11/2019 20:26   Ct Lumbar Spine Wo Contrast  Result Date: 05/11/2019 CLINICAL DATA:  Low back and right leg pain following a fall 5 days ago. Gross hematuria. Fever. EXAM: CT LUMBAR SPINE WITHOUT CONTRAST TECHNIQUE: Multidetector CT imaging of the lumbar spine was performed without intravenous contrast administration. Multiplanar CT image reconstructions were also generated. COMPARISON:  Lumbar spine radiographs dated 07/03/2010. FINDINGS: Segmentation: 5 non-rib-bearing lumbar vertebrae. Alignment:  Stable mild retrolisthesis at the L2-3 level and L3-4 level and mild retrolisthesis at the L5-S1 level. Interval minimal anterolisthesis at the L4-5 level. Mild levoconvex scoliosis in the lower lumbar spine. Vertebrae: No acute fracture or focal pathologic process. Paraspinal and other soft tissues: Unremarkable. A small left pleural effusion is noted. Disc levels: T11-12: Unremarkable. T12-L1: Unremarkable. L1-2: Mild diffuse, concentric disc protrusion without significant canal or foraminal stenosis. L2-3: Mild diffuse, concentric disc protrusion without significant canal or foraminal stenosis. L3-4: Mild diffuse concentric disc protrusion and associated spur formation. Facet spur formation on the left, causing mild to moderate lateral recess stenosis on the left. L4-5: Mild-to-moderate diffuse, concentric disc protrusion, mild bilateral ligamentum flavum hypertrophy, moderate facet hypertrophy on the right and mild facet hypertrophy on the left. These changes are producing mild canal stenosis, moderate to marked foraminal stenosis on the right and moderate foraminal stenosis on the left. L5-S1: Mild to moderate diffuse, concentric disc protrusion and associated spur formation, extending more toward the right than toward the left posteriorly in the spinal canal. There is also bilateral facet hypertrophy. These changes are producing  marked foraminal stenosis on the right and moderate foraminal stenosis on the left. IMPRESSION: 1. No acute abnormality. 2. Multilevel degenerative changes, as described above. 3. Small left pleural effusion. Electronically Signed   By: Claudie Revering M.D.   On: 05/11/2019 20:34   Dg Pelvis Portable  Result Date: 05/11/2019 CLINICAL DATA:  Low back and right leg pain following a fall 5 days ago. EXAM: PORTABLE PELVIS 1-2 VIEWS COMPARISON:  None. FINDINGS: Normal appearing pelvic bones and hips with no fracture or dislocation. Lower lumbar spine degenerative changes and mild scoliosis.  IMPRESSION: No fracture or dislocation. Electronically Signed   By: Claudie Revering M.D.   On: 05/11/2019 17:15   Dg Chest Port 1 View  Result Date: 05/11/2019 CLINICAL DATA:  Cough and fever. Recent fall. EXAM: PORTABLE CHEST 1 VIEW COMPARISON:  04/19/2005. FINDINGS: Normal sized heart. Patchy airspace opacity and pleural fluid at the left lung base. Clear right lung. Unremarkable bones. IMPRESSION: Left basilar pneumonia and small left pleural effusion. Electronically Signed   By: Claudie Revering M.D.   On: 05/11/2019 17:14   Dg Femur Min 2 Views Left  Result Date: 05/11/2019 CLINICAL DATA:  Pain secondary to a fall 3 days ago. EXAM: LEFT FEMUR 2 VIEWS COMPARISON:  None. FINDINGS: There is no evidence of fracture or other focal bone lesions. Soft tissues are unremarkable. IMPRESSION: Negative. Electronically Signed   By: Lorriane Shire M.D.   On: 05/11/2019 17:26   Dg Femur Min 2 Views Right  Result Date: 05/11/2019 CLINICAL DATA:  Low back and right leg pain following a fall 5 days ago. EXAM: RIGHT FEMUR 2 VIEWS COMPARISON:  None. FINDINGS: There is no evidence of fracture or other focal bone lesions. Soft tissues are unremarkable. IMPRESSION: No fracture. Electronically Signed   By: Claudie Revering M.D.   On: 05/11/2019 17:20    EKG: Independently reviewed. Sinus rhythm, rate 82, QTc 424 ms.   Assessment/Plan   1. Pneumonia  - Presents with malaise, fever, cough, and is found to have leukocytosis and CXR findings concerning for PNA  - Blood cultures were collected in the ED, 30 cc/kg NS bolus was given, and he was treated with cefepime, vancomycin, and Flagyl  - Lactic acid is reassuringly normal, BP has remained stable, COVID-19 negative  - Check sputum culture and strep pnuemo antigen, continue antibiotics with Rocephin and azithromycin, follow cultures and clinical course   2. Hyponatremia  - Serum sodium is 129 on admission in setting of hypovolemia and PNA; he is alert, denies headache, and  his gen weakness is likely secondary to PNA rather than hyponatremia - He was fluid-resuscitated in ED with 2.5 liters NS  - SLIV for now, repeat chem panel in am    3. Protein-calorie malnutrition  - Appears malnourished, albumin 2.6   - Dietary consulted    PPE: Mask, face shield  DVT prophylaxis: Lovenox  Code Status: Full  Family Communication: Discussed with patient  Consults: none Admission status: Observation     Vianne Bulls, MD [facility] Pager [phone]  If 7PM-7AM, please contact night-coverage [email or web address] Password [email or web address]  05/11/2019, 9:11 PM

## 2019-05-11 NOTE — Progress Notes (Signed)
Pharmacy Antibiotic Note  Randall Valencia is a 58 y.o. male admitted on 05/11/2019 with sepsis.  Pharmacy has been consulted for cefepime/vancomycin dosing. Per patient, weight roughly 77kg. Tmax 100.7 and endorses hematuria after fall 5 days ago. Scr currently 1.14 with CrCl 76.19mL/min  Plan: Cefepime 2g x1, followed by 2g q8h Vancomycin IV 1500mg  x1, followed by 1000mg  q12h Flagyl per MD Monitor renal function, culture data, LOT, and vanc levels as needed  Height: 5\' 6"  (167.6 cm) IBW/kg (Calculated) : 63.8  No data recorded.  No results for input(s): WBC, CREATININE, LATICACIDVEN, VANCOTROUGH, VANCOPEAK, VANCORANDOM, GENTTROUGH, GENTPEAK, GENTRANDOM, TOBRATROUGH, TOBRAPEAK, TOBRARND, AMIKACINPEAK, AMIKACINTROU, AMIKACIN in the last 168 hours.  CrCl cannot be calculated (No successful lab value found.).    Not on File  Antimicrobials this admission: Vanc 6/2 >> Cefepime 6/2 >> Flagyl 6/2 >>  Thank you for allowing pharmacy to be a part of this patient's care.  Janae Bridgeman, PharmD PGY1 Pharmacy Resident Phone: [phone] 05/11/2019 4:37 PM

## 2019-05-12 ENCOUNTER — Inpatient Hospital Stay (HOSPITAL_COMMUNITY): Payer: Self-pay

## 2019-05-12 DIAGNOSIS — J40 Bronchitis, not specified as acute or chronic: Secondary | ICD-10-CM

## 2019-05-12 DIAGNOSIS — J9801 Acute bronchospasm: Secondary | ICD-10-CM

## 2019-05-12 DIAGNOSIS — Z720 Tobacco use: Secondary | ICD-10-CM

## 2019-05-12 LAB — COMPREHENSIVE METABOLIC PANEL
ALT: 12 U/L (ref 0–44)
AST: 18 U/L (ref 15–41)
Albumin: 2.2 g/dL — ABNORMAL LOW (ref 3.5–5.0)
Alkaline Phosphatase: 61 U/L (ref 38–126)
Anion gap: 11 (ref 5–15)
BUN: 15 mg/dL (ref 6–20)
CO2: 21 mmol/L — ABNORMAL LOW (ref 22–32)
Calcium: 7.9 mg/dL — ABNORMAL LOW (ref 8.9–10.3)
Chloride: 102 mmol/L (ref 98–111)
Creatinine, Ser: 1.03 mg/dL (ref 0.61–1.24)
GFR calc Af Amer: >60 mL/min (ref >60)
GFR calc non Af Amer: >60 mL/min (ref >60)
Glucose, Bld: 88 mg/dL (ref 70–99)
Potassium: 4.2 mmol/L (ref 3.5–5.1)
Sodium: 134 mmol/L — ABNORMAL LOW (ref 135–145)
Total Bilirubin: 0.8 mg/dL (ref 0.3–1.2)
Total Protein: 5 g/dL — ABNORMAL LOW (ref 6.5–8.1)

## 2019-05-12 LAB — LACTATE DEHYDROGENASE: LDH: 109 U/L (ref 98–192)

## 2019-05-12 LAB — BLOOD CULTURE ID PANEL (REFLEXED)
Acinetobacter baumannii: NOT DETECTED
Candida albicans: NOT DETECTED
Candida glabrata: NOT DETECTED
Candida krusei: NOT DETECTED
Candida parapsilosis: NOT DETECTED
Candida tropicalis: NOT DETECTED
Enterobacter cloacae complex: NOT DETECTED
Enterobacteriaceae species: NOT DETECTED
Enterococcus species: NOT DETECTED
Escherichia coli: NOT DETECTED
Haemophilus influenzae: NOT DETECTED
Klebsiella oxytoca: NOT DETECTED
Klebsiella pneumoniae: NOT DETECTED
Listeria monocytogenes: NOT DETECTED
Methicillin resistance: NOT DETECTED
Neisseria meningitidis: NOT DETECTED
Proteus species: NOT DETECTED
Pseudomonas aeruginosa: NOT DETECTED
Serratia marcescens: NOT DETECTED
Staphylococcus aureus (BCID): NOT DETECTED
Staphylococcus species: DETECTED — AB
Streptococcus agalactiae: NOT DETECTED
Streptococcus pneumoniae: DETECTED — AB
Streptococcus pyogenes: NOT DETECTED
Streptococcus species: DETECTED — AB

## 2019-05-12 LAB — CBC WITH DIFFERENTIAL/PLATELET
Abs Immature Granulocytes: 0.06 10*3/uL (ref 0.00–0.07)
Basophils Absolute: 0 10*3/uL (ref 0.0–0.1)
Basophils Relative: 0 %
Eosinophils Absolute: 0.3 10*3/uL (ref 0.0–0.5)
Eosinophils Relative: 2 %
HCT: 33.2 % — ABNORMAL LOW (ref 39.0–52.0)
Hemoglobin: 10.9 g/dL — ABNORMAL LOW (ref 13.0–17.0)
Immature Granulocytes: 0 %
Lymphocytes Relative: 13 %
Lymphs Abs: 1.8 10*3/uL (ref 0.7–4.0)
MCH: 29.5 pg (ref 26.0–34.0)
MCHC: 32.8 g/dL (ref 30.0–36.0)
MCV: 90 fL (ref 80.0–100.0)
Monocytes Absolute: 0.9 10*3/uL (ref 0.1–1.0)
Monocytes Relative: 7 %
Neutro Abs: 11 10*3/uL — ABNORMAL HIGH (ref 1.7–7.7)
Neutrophils Relative %: 78 %
Platelets: 205 10*3/uL (ref 150–400)
RBC: 3.69 MIL/uL — ABNORMAL LOW (ref 4.22–5.81)
RDW: 14 % (ref 11.5–15.5)
WBC: 14.2 10*3/uL — ABNORMAL HIGH (ref 4.0–10.5)
nRBC: 0 % (ref 0.0–0.2)

## 2019-05-12 LAB — PROCALCITONIN: Procalcitonin: 0.61 ng/mL

## 2019-05-12 LAB — D-DIMER, QUANTITATIVE: D-Dimer, Quant: 2.85 ug/mL-FEU — ABNORMAL HIGH (ref 0.00–0.50)

## 2019-05-12 LAB — HIV ANTIBODY (ROUTINE TESTING W REFLEX): HIV Screen 4th Generation wRfx: NONREACTIVE

## 2019-05-12 LAB — STREP PNEUMONIAE URINARY ANTIGEN: Strep Pneumo Urinary Antigen: NEGATIVE

## 2019-05-12 LAB — PREALBUMIN: Prealbumin: <5 mg/dL — ABNORMAL LOW (ref 18–38)

## 2019-05-12 LAB — SEDIMENTATION RATE: Sed Rate: 42 mm/hr — ABNORMAL HIGH (ref 0–16)

## 2019-05-12 LAB — C-REACTIVE PROTEIN: CRP: 13.1 mg/dL — ABNORMAL HIGH (ref <1.0)

## 2019-05-12 LAB — FERRITIN: Ferritin: 508 ng/mL — ABNORMAL HIGH (ref 24–336)

## 2019-05-12 MED ORDER — SODIUM CHLORIDE 0.9 % IV SOLN
2.0000 g | INTRAVENOUS | Status: DC
  Administered 2019-05-12 – 2019-05-16 (×5): 2 g via INTRAVENOUS
  Filled 2019-05-12 (×6): qty 20

## 2019-05-12 MED ORDER — ENSURE ENLIVE PO LIQD
237.0000 mL | Freq: Two times a day (BID) | ORAL | Status: DC
  Administered 2019-05-12 – 2019-05-16 (×9): 237 mL via ORAL

## 2019-05-12 MED ORDER — IPRATROPIUM-ALBUTEROL 0.5-2.5 (3) MG/3ML IN SOLN
3.0000 mL | Freq: Four times a day (QID) | RESPIRATORY_TRACT | Status: DC
  Administered 2019-05-12 (×2): 3 mL via RESPIRATORY_TRACT
  Filled 2019-05-12 (×2): qty 3

## 2019-05-12 MED ORDER — IOHEXOL 350 MG/ML SOLN
100.0000 mL | Freq: Once | INTRAVENOUS | Status: AC | PRN
  Administered 2019-05-12: 100 mL via INTRAVENOUS

## 2019-05-12 MED ORDER — GUAIFENESIN-DM 100-10 MG/5ML PO SYRP
5.0000 mL | ORAL_SOLUTION | ORAL | Status: DC | PRN
  Administered 2019-05-12 – 2019-05-16 (×5): 5 mL via ORAL
  Filled 2019-05-12 (×6): qty 5

## 2019-05-12 MED ORDER — METHYLPREDNISOLONE SODIUM SUCC 40 MG IJ SOLR
40.0000 mg | Freq: Three times a day (TID) | INTRAMUSCULAR | Status: DC
  Administered 2019-05-12 – 2019-05-17 (×15): 40 mg via INTRAVENOUS
  Filled 2019-05-12 (×15): qty 1

## 2019-05-12 MED ORDER — IPRATROPIUM-ALBUTEROL 0.5-2.5 (3) MG/3ML IN SOLN
3.0000 mL | RESPIRATORY_TRACT | Status: DC | PRN
  Filled 2019-05-12: qty 3

## 2019-05-12 MED ORDER — BUDESONIDE 0.5 MG/2ML IN SUSP
0.5000 mg | Freq: Two times a day (BID) | RESPIRATORY_TRACT | Status: DC
  Administered 2019-05-12 – 2019-05-17 (×10): 0.5 mg via RESPIRATORY_TRACT
  Filled 2019-05-12 (×10): qty 2

## 2019-05-12 NOTE — ED Notes (Signed)
ED TO INPATIENT HANDOFF REPORT  ED Nurse Name and Phone #: Gretta Cool [phone]  S Name/Age/Gender Randall Valencia 58 y.o. male Room/Bed: 017C/017C  Code Status   Code Status: Full Code  Home/SNF/Other Home Patient oriented to: self, place, time and situation Is this baseline? Yes   Triage Complete: Triage complete  Chief Complaint FEVER,FALL  Triage Note Pt arrives via EMS from home with reports of fall 5 days ago. Pain to lower back and right leg. Endorses blood in urine and states he has not eaten. EMS reports fever of 100.7. 50 mcg fentanyl given. Pt states he has been in his truck for the past 3 days cause he was unable to get out.   Allergies No Known Allergies  Level of Care/Admitting Diagnosis ED Disposition    ED Disposition Condition [facility] Area: [place] [100100]  Level of Care: Med-Surg [16]  I expect the patient will be discharged within 24 hours: Yes  LOW acuity---Tx typically complete <24 hrs---ACUTE conditions typically can be evaluated <24 hours---LABS likely to return to acceptable levels <24 hours---IS near functional baseline---EXPECTED to return to current living arrangement---NOT newly hypoxic: Does not meet criteria for 5C-Observation unit  Covid Evaluation: Screening Protocol (No Symptoms)  Diagnosis: CAP (community acquired pneumonia) [503546]  Admitting Physician: Vianne Bulls [[id number]]  Attending Physician: Vianne Bulls [[id number]]  PT Class (Do Not Modify): Observation [104]  PT Acc Code (Do Not Modify): Observation [10022]       B Medical/Surgery History Past Medical History:  Diagnosis Date  . Cerebral aneurysm    History reviewed. No pertinent surgical history.   A IV Location/Drains/Wounds Patient Lines/Drains/Airways Status   Active Line/Drains/Airways    Name:   Placement date:   Placement time:   Site:   Days:   Peripheral IV 05/11/19 Right Antecubital   05/11/19    1630    Antecubital   1    Peripheral IV 05/11/19 Left Forearm   05/11/19    1640    Forearm   1          Intake/Output Last 24 hours  Intake/Output Summary (Last 24 hours) at 05/12/2019 0006 Last data filed at 05/11/2019 1823 Gross per 24 hour  Intake 2600 ml  Output -  Net 2600 ml    Labs/Imaging Results for orders placed or performed during the hospital encounter of 05/11/19 (from the past 48 hour(s))  Lactic acid, plasma     Status: None   Collection Time: 05/11/19  5:00 PM  Result Value Ref Range   Lactic Acid, Venous 1.2 0.5 - 1.9 mmol/L    Comment: Performed at [facility], [address], [place], [place] [address]  Comprehensive metabolic panel     Status: Abnormal   Collection Time: 05/11/19  5:00 PM  Result Value Ref Range   Sodium 129 (L) 135 - 145 mmol/L   Potassium 3.8 3.5 - 5.1 mmol/L   Chloride 94 (L) 98 - 111 mmol/L   CO2 23 22 - 32 mmol/L   Glucose, Bld 103 (H) 70 - 99 mg/dL   BUN 15 6 - 20 mg/dL   Creatinine, Ser 1.14 0.61 - 1.24 mg/dL   Calcium 8.1 (L) 8.9 - 10.3 mg/dL   Total Protein 6.1 (L) 6.5 - 8.1 g/dL   Albumin 2.6 (L) 3.5 - 5.0 g/dL   AST 21 15 - 41 U/L   ALT 17 0 - 44 U/L   Alkaline  Phosphatase 69 38 - 126 U/L   Total Bilirubin 1.3 (H) 0.3 - 1.2 mg/dL   GFR calc non Af Amer >60 >60 mL/min   GFR calc Af Amer >60 >60 mL/min   Anion gap 12 5 - 15    Comment: Performed at [facility] [address], [place], [place] [address]  CBC WITH DIFFERENTIAL     Status: Abnormal   Collection Time: 05/11/19  5:00 PM  Result Value Ref Range   WBC 16.5 (H) 4.0 - 10.5 K/uL   RBC 3.89 (L) 4.22 - 5.81 MIL/uL   Hemoglobin 11.5 (L) 13.0 - 17.0 g/dL   HCT 35.0 (L) 39.0 - 52.0 %   MCV 90.0 80.0 - 100.0 fL   MCH 29.6 26.0 - 34.0 pg   MCHC 32.9 30.0 - 36.0 g/dL   RDW 13.8 11.5 - 15.5 %   Platelets 274 150 - 400 K/uL   nRBC 0.0 0.0 - 0.2 %   Neutrophils Relative % 82 %   Neutro Abs 13.7 (H) 1.7 - 7.7 K/uL   Lymphocytes Relative 10 %   Lymphs Abs 1.6 0.7 - 4.0 K/uL    Monocytes Relative 6 %   Monocytes Absolute 1.0 0.1 - 1.0 K/uL   Eosinophils Relative 1 %   Eosinophils Absolute 0.1 0.0 - 0.5 K/uL   Basophils Relative 0 %   Basophils Absolute 0.0 0.0 - 0.1 K/uL   Immature Granulocytes 1 %   Abs Immature Granulocytes 0.08 (H) 0.00 - 0.07 K/uL    Comment: Performed at [facility], [address], [place], [place] [address]  Lipase, blood     Status: None   Collection Time: 05/11/19  5:00 PM  Result Value Ref Range   Lipase 30 11 - 51 U/L    Comment: Performed at [facility], [place] [address], [place], [place] [address]  SARS Coronavirus 2 (CEPHEID- Performed in [place] hospital lab), Hosp Order     Status: None   Collection Time: 05/11/19  5:02 PM  Result Value Ref Range   SARS Coronavirus 2 NEGATIVE NEGATIVE    Comment: (NOTE) If result is NEGATIVE SARS-CoV-2 target nucleic acids are NOT DETECTED. The SARS-CoV-2 RNA is generally detectable in upper and lower  respiratory specimens during the acute phase of infection. The lowest  concentration of SARS-CoV-2 viral copies this assay can detect is 250  copies / mL. A negative result does not preclude SARS-CoV-2 infection  and should not be used as the sole basis for treatment or other  patient management decisions.  A negative result may occur with  improper specimen collection / handling, submission of specimen other  than nasopharyngeal swab, presence of viral mutation(s) within the  areas targeted by this assay, and inadequate number of viral copies  (<250 copies / mL). A negative result must be combined with clinical  observations, patient history, and epidemiological information. If result is POSITIVE SARS-CoV-2 target nucleic acids are DETECTED. The SARS-CoV-2 RNA is generally detectable in upper and lower  respiratory specimens dur ing the acute phase of infection.  Positive  results are indicative of active infection with SARS-CoV-2.  Clinical  correlation with patient  history and other diagnostic information is  necessary to determine patient infection status.  Positive results do  not rule out bacterial infection or co-infection with other viruses. If result is PRESUMPTIVE POSTIVE SARS-CoV-2 nucleic acids MAY BE PRESENT.   A presumptive positive result was obtained on the  submitted specimen  and confirmed on repeat testing.  While 2019 novel coronavirus  (SARS-CoV-2) nucleic acids may be present in the submitted sample  additional confirmatory testing may be necessary for epidemiological  and / or clinical management purposes  to differentiate between  SARS-CoV-2 and other Sarbecovirus currently known to infect humans.  If clinically indicated additional testing with an alternate test  methodology [phone]) is advised. The SARS-CoV-2 RNA is generally  detectable in upper and lower respiratory sp ecimens during the acute  phase of infection. The expected result is Negative. Fact Sheet for Patients:  StrictlyIdeas.no Fact Sheet for Healthcare Providers: BankingDealers.co.za This test is not yet approved or cleared by the [place] FDA and has been authorized for detection and/or diagnosis of SARS-CoV-2 by FDA under an Emergency Use Authorization (EUA).  This EUA will remain in effect (meaning this test can be used) for the duration of the COVID-19 declaration under Section 564(b)(1) of the Act, 21 U.S.C. section 360bbb-3(b)(1), unless the authorization is terminated or revoked sooner. Performed at [facility], [place] [address], [place], [place] [address]   Urinalysis, Routine w reflex microscopic     Status: Abnormal   Collection Time: 05/11/19  5:45 PM  Result Value Ref Range   Color, Urine AMBER (A) YELLOW    Comment: BIOCHEMICALS MAY BE AFFECTED BY COLOR   APPearance CLEAR CLEAR   Specific Gravity, Urine 1.021 1.005 - 1.030   pH 5.0 5.0 - 8.0   Glucose, UA NEGATIVE NEGATIVE mg/dL   Hgb  urine dipstick NEGATIVE NEGATIVE   Bilirubin Urine NEGATIVE NEGATIVE   Ketones, ur 5 (A) NEGATIVE mg/dL   Protein, ur 30 (A) NEGATIVE mg/dL   Nitrite NEGATIVE NEGATIVE   Leukocytes,Ua NEGATIVE NEGATIVE   RBC / HPF 0-5 0 - 5 RBC/hpf   WBC, UA 0-5 0 - 5 WBC/hpf   Bacteria, UA NONE SEEN NONE SEEN   Squamous Epithelial / LPF 0-5 0 - 5   Mucus PRESENT     Comment: Performed at [facility], [place] [address] [place], [place] [address]   Ct Head Wo Contrast  Result Date: 05/11/2019 CLINICAL DATA:  58 year old male with head trauma. EXAM: CT HEAD WITHOUT CONTRAST TECHNIQUE: Contiguous axial images were obtained from the base of the skull through the vertex without intravenous contrast. COMPARISON:  Head CT dated 05/23/2005 FINDINGS: Brain: The ventricles and sulci appropriate size for patient's age. Minimal periventricular and deep white matter chronic microvascular ischemic changes noted. There is no acute intracranial hemorrhage. No mass effect or midline shift. No extra-axial fluid collection. Vascular: No hyperdense vessel or unexpected calcification. Skull: Normal. Negative for fracture or focal lesion. Sinuses/Orbits: No acute finding. Other: None IMPRESSION: No acute intracranial pathology. Electronically Signed   By: Anner Crete M.D.   On: 05/11/2019 20:26   Ct Lumbar Spine Wo Contrast  Result Date: 05/11/2019 CLINICAL DATA:  Low back and right leg pain following a fall 5 days ago. Gross hematuria. Fever. EXAM: CT LUMBAR SPINE WITHOUT CONTRAST TECHNIQUE: Multidetector CT imaging of the lumbar spine was performed without intravenous contrast administration. Multiplanar CT image reconstructions were also generated. COMPARISON:  Lumbar spine radiographs dated 07/03/2010. FINDINGS: Segmentation: 5 non-rib-bearing lumbar vertebrae. Alignment: Stable mild retrolisthesis at the L2-3 level and L3-4 level and mild retrolisthesis at the L5-S1 level. Interval minimal anterolisthesis at the L4-5  level. Mild levoconvex scoliosis in the lower lumbar spine. Vertebrae: No acute fracture or focal pathologic process. Paraspinal and other soft tissues:  Unremarkable. A small left pleural effusion is noted. Disc levels: T11-12: Unremarkable. T12-L1: Unremarkable. L1-2: Mild diffuse, concentric disc protrusion without significant canal or foraminal stenosis. L2-3: Mild diffuse, concentric disc protrusion without significant canal or foraminal stenosis. L3-4: Mild diffuse concentric disc protrusion and associated spur formation. Facet spur formation on the left, causing mild to moderate lateral recess stenosis on the left. L4-5: Mild-to-moderate diffuse, concentric disc protrusion, mild bilateral ligamentum flavum hypertrophy, moderate facet hypertrophy on the right and mild facet hypertrophy on the left. These changes are producing mild canal stenosis, moderate to marked foraminal stenosis on the right and moderate foraminal stenosis on the left. L5-S1: Mild to moderate diffuse, concentric disc protrusion and associated spur formation, extending more toward the right than toward the left posteriorly in the spinal canal. There is also bilateral facet hypertrophy. These changes are producing marked foraminal stenosis on the right and moderate foraminal stenosis on the left. IMPRESSION: 1. No acute abnormality. 2. Multilevel degenerative changes, as described above. 3. Small left pleural effusion. Electronically Signed   By: Claudie Revering M.D.   On: 05/11/2019 20:34   Dg Pelvis Portable  Result Date: 05/11/2019 CLINICAL DATA:  Low back and right leg pain following a fall 5 days ago. EXAM: PORTABLE PELVIS 1-2 VIEWS COMPARISON:  None. FINDINGS: Normal appearing pelvic bones and hips with no fracture or dislocation. Lower lumbar spine degenerative changes and mild scoliosis. IMPRESSION: No fracture or dislocation. Electronically Signed   By: Claudie Revering M.D.   On: 05/11/2019 17:15   Dg Chest Port 1 View  Result  Date: 05/11/2019 CLINICAL DATA:  Cough and fever. Recent fall. EXAM: PORTABLE CHEST 1 VIEW COMPARISON:  04/19/2005. FINDINGS: Normal sized heart. Patchy airspace opacity and pleural fluid at the left lung base. Clear right lung. Unremarkable bones. IMPRESSION: Left basilar pneumonia and small left pleural effusion. Electronically Signed   By: Claudie Revering M.D.   On: 05/11/2019 17:14   Dg Femur Min 2 Views Left  Result Date: 05/11/2019 CLINICAL DATA:  Pain secondary to a fall 3 days ago. EXAM: LEFT FEMUR 2 VIEWS COMPARISON:  None. FINDINGS: There is no evidence of fracture or other focal bone lesions. Soft tissues are unremarkable. IMPRESSION: Negative. Electronically Signed   By: Lorriane Shire M.D.   On: 05/11/2019 17:26   Dg Femur Min 2 Views Right  Result Date: 05/11/2019 CLINICAL DATA:  Low back and right leg pain following a fall 5 days ago. EXAM: RIGHT FEMUR 2 VIEWS COMPARISON:  None. FINDINGS: There is no evidence of fracture or other focal bone lesions. Soft tissues are unremarkable. IMPRESSION: No fracture. Electronically Signed   By: Claudie Revering M.D.   On: 05/11/2019 17:20    Pending Labs Unresulted Labs (From admission, onward)    Start     Ordered   05/18/19 0500  Creatinine, serum  (enoxaparin (LOVENOX)    CrCl >/= 30 ml/min)  Weekly,   R    Comments:  while on enoxaparin therapy    05/11/19 2111   05/12/19 0500  HIV antibody (Routine Testing)  Tomorrow morning,   R     05/11/19 2111   05/12/19 0500  Comprehensive metabolic panel  Tomorrow morning,   R     05/11/19 2111   05/12/19 0500  CBC WITH DIFFERENTIAL  Tomorrow morning,   R     05/11/19 2111   05/12/19 0500  Prealbumin  Tomorrow morning,   R     05/11/19 2111   05/11/19  2148  Strep pneumoniae urinary antigen  Add-on,   R     05/11/19 2147   05/11/19 2148  Legionella Pneumophila Serogp 1 Ur Ag  Add-on,   R     05/11/19 2147   05/11/19 2110  Culture, sputum-assessment  Once,   R     05/11/19 2111   05/11/19 2110  Gram  stain  Once,   R     05/11/19 2111   05/11/19 1616  Blood Culture (routine x 2)  BLOOD CULTURE X 2,   STAT     05/11/19 1618          Vitals/Pain Today's Vitals   05/11/19 2200 05/11/19 2230 05/11/19 2300 05/11/19 2319  BP: 107/64 (!) 105/53 (!) 98/55   Pulse: 88 84 82   Resp: 19 (!) 0 16   Temp:    98.1 F (36.7 C)  TempSrc:    Oral  SpO2: 100% 94% 95%   Weight:      Height:      PainSc:        Isolation Precautions No active isolations  Medications Medications  enoxaparin (LOVENOX) injection 40 mg (has no administration in time range)  sodium chloride flush (NS) 0.9 % injection 3 mL (3 mLs Intravenous Given 05/11/19 2146)  sodium chloride flush (NS) 0.9 % injection 3 mL (has no administration in time range)  0.9 %  sodium chloride infusion (has no administration in time range)  acetaminophen (TYLENOL) tablet 650 mg (has no administration in time range)    Or  acetaminophen (TYLENOL) suppository 650 mg (has no administration in time range)  HYDROcodone-acetaminophen (NORCO/VICODIN) 5-325 MG per tablet 1-2 tablet (2 tablets Oral Given 05/11/19 2157)  senna-docusate (Senokot-S) tablet 1 tablet (has no administration in time range)  ondansetron (ZOFRAN) tablet 4 mg (has no administration in time range)    Or  ondansetron (ZOFRAN) injection 4 mg (has no administration in time range)  cefTRIAXone (ROCEPHIN) 1 g in sodium chloride 0.9 % 100 mL IVPB (0 g Intravenous Stopped 05/11/19 2239)  azithromycin (ZITHROMAX) 500 mg in sodium chloride 0.9 % 250 mL IVPB (500 mg Intravenous New Bag/Given 05/11/19 2320)  ketorolac (TORADOL) 15 MG/ML injection 15 mg (has no administration in time range)  ceFEPIme (MAXIPIME) 2 g in sodium chloride 0.9 % 100 mL IVPB (0 g Intravenous Stopped 05/11/19 1807)  metroNIDAZOLE (FLAGYL) IVPB 500 mg (0 mg Intravenous Stopped 05/11/19 1925)  sodium chloride 0.9 % bolus 1,000 mL (0 mLs Intravenous Stopped 05/11/19 1746)    And  sodium chloride 0.9 % bolus 1,000 mL (0  mLs Intravenous Stopped 05/11/19 1823)    And  sodium chloride 0.9 % bolus 500 mL (0 mLs Intravenous Stopped 05/11/19 1807)  vancomycin (VANCOCIN) 1,500 mg in sodium chloride 0.9 % 500 mL IVPB (0 mg Intravenous Stopped 05/11/19 2138)  morphine 2 MG/ML injection 2 mg (2 mg Intravenous Given 05/11/19 1923)    Mobility walks Moderate fall risk   Focused Assessments Neuro Assessment Handoff:        Neuro Assessment: Within Defined Limits          R Recommendations: See Admitting Provider Note  Report given to:   Additional Notes:

## 2019-05-12 NOTE — Progress Notes (Signed)
   05/12/19 1159  Clinical Encounter Type  Visited With Patient  Visit Type Initial;Other (Comment) (AD)  Referral From Nurse  Consult/Referral To Chaplain  This chaplain responded to Pt. consult for AD.  The chaplain read the Pt. chart and talked to the RN-Gladys.  The chaplain understands from the RN the Pt. is Covid negative.  The chaplain listened as the Pt. expressed an interest in reading the AD document before talking to the chaplain.  The Pt. is waiting for a friend to bring reading classes to him.  The goal is for spiritual care to F/U in 24 hrs per Pt. desire to experience less coughing while trying to talk.

## 2019-05-12 NOTE — Progress Notes (Signed)
Initial Nutrition Assessment  INTERVENTION:   Provide Ensure Enlive po BID, each supplement provides 350 kcal and 20 grams of protein  NUTRITION DIAGNOSIS:   Inadequate oral intake related to poor appetite as evidenced by per patient/family report.  GOAL:   Patient will meet greater than or equal to 90% of their needs  MONITOR:   PO intake, Labs, Supplement acceptance, Weight trends, I & O's  REASON FOR ASSESSMENT:   Consult Assessment of nutrition requirement/status  ASSESSMENT:    58 y.o. male with medical history significant for cerebral aneurysm, reports that he has not seen a physician in several years, and now presents for evaluation of generalized weakness, malaise, fever, and pain in his low back and right leg after a fall a few days ago. Admitted for possible PNA.  **RD working remotely**  Patient reports falling 5 days ago. Pt has not been eating well since and he was stuck in his truck for ~ 3 days d/t not being able to get out. No PO has been documented yet. Pt would likely benefit from nutritional supplements, will order Ensure supplements.  No weight records in chart. Pt did not report any weight loss.  Medications reviewed. Labs reviewed: Low Na   NUTRITION - FOCUSED PHYSICAL EXAM:  Unable to perform per department requirements to work remotely.  Diet Order:   Diet Order            Diet Heart Room service appropriate? Yes; Fluid consistency: Thin  Diet effective now              EDUCATION NEEDS:   No education needs have been identified at this time  Skin:  Skin Assessment: Reviewed RN Assessment  Last BM:  5/31  Height:   Ht Readings from Last 1 Encounters:  05/12/19 6' 2.02" (1.88 m)    Weight:   Wt Readings from Last 1 Encounters:  05/12/19 72.6 kg    Ideal Body Weight:  86.3 kg  BMI:  Body mass index is 20.54 kg/m.  Estimated Nutritional Needs:   Kcal:  2150-2350  Protein:  85-95g  Fluid:  2.1L/day  Randall Bibles,  MS, RD, LDN [place] Dietitian Pager: [phone] After Hours Pager: [phone]

## 2019-05-12 NOTE — Progress Notes (Signed)
PROGRESS NOTE    Randall Valencia  ZOX:[id number] DOB: [birth year] DOA: 05/11/2019 PCP: Patient, No Pcp Per   Brief Narrative:  58 year old with history of cerebral aneurysm, tobacco use came to the hospital with complains of generalized fever, aches and chills.  He reports of chronic cough due to smoking.  In the ER he was noted to be tachypneic with abnormal breath sounds.  Saturating well on room air.  Chest x-ray showed concerning for left basilar pneumonia.  Inflammatory markers slightly elevated.  CT of the head negative.  Also reported of back pain therefore CT lumbar spine which is negative.  Chemistry showed sodium 129, leukocytosis 16,000, elevated procalcitonin.  Started on broad-spectrum antibiotics and medical team was requested to admit the patient.  His call with test was negative.   Assessment & Plan:   Principal Problem:   CAP (community acquired pneumonia) Active Problems:   Hyponatremia   Protein calorie malnutrition ([place])   Sepsis secondary to left lower lobe pneumonia, POA Hyponatremia secondary to mild to moderate dehydration - Follow-up culture data.  Continue Roc/Azithro D2. -If MRSA negative- discontinue vancomycin - Check BMP, trend procalcitonin -Bronchodilators scheduled and as necessary -Incentive spirometry and flutter valve. - Hyponatremia improved with hydration, sodium is 134. - Urine strep-negative, COVID-negative -Legionella-pending -HIV-negative, UA-negative -D-dimer elevated, CTA chest to rule out PE.  Bronchospasm secondary to community-acquired pneumonia Active tobacco use, suspect underlying COPD - Solu-Medrol IV every 8 hours, bronchodilators. -Will need outpatient PFTs -Nicotine patch as necessary.  Counseled to quit smoking cigarettes  Severe protein calorie malnutrition -Nutrition team has been consulted.  Prealbumin less than 5  DVT prophylaxis: Lovenox Code Status: Full code Family Communication: None at bedside Disposition Plan:  Given his significant amount of respiratory symptoms, sepsis and very abnormal breath sounds.  He needs to remain in the hospital for aggressive breathing treatment, antibiotics and supportive care.  High risk for decompensation  Consultants:   None  Procedures:   None  Antimicrobials:   Rocephin day 2  Azithromycin day 2   Subjective: Patient states his breathing is slightly better but overall still feels extremely weak and has exertional shortness of breath.  Continues to have some productive cough bringing up thick yellowish mucus  Review of Systems Otherwise negative except as per HPI, including: General: Denies fever, chills, night sweats or unintended weight loss. Resp: Denies hemoptysis Cardiac: Denies chest pain, palpitations, orthopnea, paroxysmal nocturnal dyspnea. GI: Denies abdominal pain, nausea, vomiting, diarrhea or constipation GU: Denies dysuria, frequency, hesitancy or incontinence MS: Denies muscle aches, joint pain or swelling Neuro: Denies headache, neurologic deficits (focal weakness, numbness, tingling), abnormal gait Psych: Denies anxiety, depression, SI/HI/AVH Skin: Denies new rashes or lesions ID: Denies sick contacts, exotic exposures, travel  Objective: Vitals:   05/11/19 2319 05/12/19 0015 05/12/19 0057 05/12/19 0735  BP:  (!) 95/57 122/69 108/72  Pulse:  81 89 72  Resp:  (!) 21    Temp: 98.1 F (36.7 C)  98.3 F (36.8 C) 98.7 F (37.1 C)  TempSrc: Oral  Oral Oral  SpO2:  98% 99% 98%  Weight:   72.6 kg   Height:   6' 2.02" (1.88 m)     Intake/Output Summary (Last 24 hours) at 05/12/2019 1432 Last data filed at 05/12/2019 1111 Gross per 24 hour  Intake 2840 ml  Output 1000 ml  Net 1840 ml   Filed Weights   05/11/19 1731 05/12/19 0057  Weight: 77.1 kg 72.6 kg    Examination:  General exam:  Appears calm and comfortable  Respiratory system: Diffuse expiratory coarse breath sounds Cardiovascular system: S1 & S2 heard, RRR. No JVD,  murmurs, rubs, gallops or clicks. No pedal edema. Gastrointestinal system: Abdomen is nondistended, soft and nontender. No organomegaly or masses felt. Normal bowel sounds heard. Central nervous system: Alert and oriented. No focal neurological deficits. Extremities: Symmetric 5 x 5 power. Skin: No rashes, lesions or ulcers Psychiatry: Judgement and insight appear normal. Mood & affect appropriate.     Data Reviewed:   CBC: Recent Labs  Lab 05/11/19 1700 05/12/19 0527  WBC 16.5* 14.2*  NEUTROABS 13.7* 11.0*  HGB 11.5* 10.9*  HCT 35.0* 33.2*  MCV 90.0 90.0  PLT 274 601   Basic Metabolic Panel: Recent Labs  Lab 05/11/19 1700 05/12/19 0527  NA 129* 134*  K 3.8 4.2  CL 94* 102  CO2 23 21*  GLUCOSE 103* 88  BUN 15 15  CREATININE 1.14 1.03  CALCIUM 8.1* 7.9*   GFR: Estimated Creatinine Clearance: 79.3 mL/min (by C-G formula based on SCr of 1.03 mg/dL). Liver Function Tests: Recent Labs  Lab 05/11/19 1700 05/12/19 0527  AST 21 18  ALT 17 12  ALKPHOS 69 61  BILITOT 1.3* 0.8  PROT 6.1* 5.0*  ALBUMIN 2.6* 2.2*   Recent Labs  Lab 05/11/19 1700  LIPASE 30   No results for input(s): AMMONIA in the last 168 hours. Coagulation Profile: No results for input(s): INR, PROTIME in the last 168 hours. Cardiac Enzymes: No results for input(s): CKTOTAL, CKMB, CKMBINDEX, TROPONINI in the last 168 hours. BNP (last 3 results) No results for input(s): PROBNP in the last 8760 hours. HbA1C: No results for input(s): HGBA1C in the last 72 hours. CBG: No results for input(s): GLUCAP in the last 168 hours. Lipid Profile: No results for input(s): CHOL, HDL, LDLCALC, TRIG, CHOLHDL, LDLDIRECT in the last 72 hours. Thyroid Function Tests: No results for input(s): TSH, T4TOTAL, FREET4, T3FREE, THYROIDAB in the last 72 hours. Anemia Panel: Recent Labs    05/12/19 1040  FERRITIN 508*   Sepsis Labs: Recent Labs  Lab 05/11/19 1700 05/12/19 1040  PROCALCITON  --  0.61   LATICACIDVEN 1.2  --     Recent Results (from the past 240 hour(s))  Blood Culture (routine x 2)     Status: None (Preliminary result)   Collection Time: 05/11/19  5:00 PM  Result Value Ref Range Status   Specimen Description SITE NOT SPECIFIED  Final   Special Requests   Final    BOTTLES DRAWN AEROBIC AND ANAEROBIC Blood Culture adequate volume   Culture   Final    NO GROWTH < 24 HOURS Performed at [facility], [place] [address] [place], [place] [address]    Report Status PENDING  Incomplete  Blood Culture (routine x 2)     Status: None (Preliminary result)   Collection Time: 05/11/19  5:00 PM  Result Value Ref Range Status   Specimen Description SITE NOT SPECIFIED  Final   Special Requests   Final    BOTTLES DRAWN AEROBIC AND ANAEROBIC Blood Culture adequate volume   Culture   Final    NO GROWTH < 24 HOURS Performed at [facility], [place] [address] [place],  [address]    Report Status PENDING  Incomplete  SARS Coronavirus 2 (CEPHEID- Performed in [place] hospital lab), Hosp Order     Status: None   Collection Time: 05/11/19  5:02 PM  Result Value Ref Range Status  SARS Coronavirus 2 NEGATIVE NEGATIVE Final    Comment: (NOTE) If result is NEGATIVE SARS-CoV-2 target nucleic acids are NOT DETECTED. The SARS-CoV-2 RNA is generally detectable in upper and lower  respiratory specimens during the acute phase of infection. The lowest  concentration of SARS-CoV-2 viral copies this assay can detect is 250  copies / mL. A negative result does not preclude SARS-CoV-2 infection  and should not be used as the sole basis for treatment or other  patient management decisions.  A negative result may occur with  improper specimen collection / handling, submission of specimen other  than nasopharyngeal swab, presence of viral mutation(s) within the  areas targeted by this assay, and inadequate number of viral copies  (<250 copies / mL). A negative result must be  combined with clinical  observations, patient history, and epidemiological information. If result is POSITIVE SARS-CoV-2 target nucleic acids are DETECTED. The SARS-CoV-2 RNA is generally detectable in upper and lower  respiratory specimens dur ing the acute phase of infection.  Positive  results are indicative of active infection with SARS-CoV-2.  Clinical  correlation with patient history and other diagnostic information is  necessary to determine patient infection status.  Positive results do  not rule out bacterial infection or co-infection with other viruses. If result is PRESUMPTIVE POSTIVE SARS-CoV-2 nucleic acids MAY BE PRESENT.   A presumptive positive result was obtained on the submitted specimen  and confirmed on repeat testing.  While 2019 novel coronavirus  (SARS-CoV-2) nucleic acids may be present in the submitted sample  additional confirmatory testing may be necessary for epidemiological  and / or clinical management purposes  to differentiate between  SARS-CoV-2 and other Sarbecovirus currently known to infect humans.  If clinically indicated additional testing with an alternate test  methodology [phone]) is advised. The SARS-CoV-2 RNA is generally  detectable in upper and lower respiratory sp ecimens during the acute  phase of infection. The expected result is Negative. Fact Sheet for Patients:  StrictlyIdeas.no Fact Sheet for Healthcare Providers: BankingDealers.co.za This test is not yet approved or cleared by the [place] FDA and has been authorized for detection and/or diagnosis of SARS-CoV-2 by FDA under an Emergency Use Authorization (EUA).  This EUA will remain in effect (meaning this test can be used) for the duration of the COVID-19 declaration under Section 564(b)(1) of the Act, 21 U.S.C. section 360bbb-3(b)(1), unless the authorization is terminated or revoked sooner. Performed at [facility], [place] [address], [place], [place] [address]          Radiology Studies: Ct Head Wo Contrast  Result Date: 05/11/2019 CLINICAL DATA:  58 year old male with head trauma. EXAM: CT HEAD WITHOUT CONTRAST TECHNIQUE: Contiguous axial images were obtained from the base of the skull through the vertex without intravenous contrast. COMPARISON:  Head CT dated 05/23/2005 FINDINGS: Brain: The ventricles and sulci appropriate size for patient's age. Minimal periventricular and deep white matter chronic microvascular ischemic changes noted. There is no acute intracranial hemorrhage. No mass effect or midline shift. No extra-axial fluid collection. Vascular: No hyperdense vessel or unexpected calcification. Skull: Normal. Negative for fracture or focal lesion. Sinuses/Orbits: No acute finding. Other: None IMPRESSION: No acute intracranial pathology. Electronically Signed   By: Anner Crete M.D.   On: 05/11/2019 20:26   Ct Lumbar Spine Wo Contrast  Result Date: 05/11/2019 CLINICAL DATA:  Low back and right leg pain following a fall 5 days ago. Gross hematuria. Fever. EXAM: CT LUMBAR SPINE WITHOUT CONTRAST  TECHNIQUE: Multidetector CT imaging of the lumbar spine was performed without intravenous contrast administration. Multiplanar CT image reconstructions were also generated. COMPARISON:  Lumbar spine radiographs dated 07/03/2010. FINDINGS: Segmentation: 5 non-rib-bearing lumbar vertebrae. Alignment: Stable mild retrolisthesis at the L2-3 level and L3-4 level and mild retrolisthesis at the L5-S1 level. Interval minimal anterolisthesis at the L4-5 level. Mild levoconvex scoliosis in the lower lumbar spine. Vertebrae: No acute fracture or focal pathologic process. Paraspinal and other soft tissues: Unremarkable. A small left pleural effusion is noted. Disc levels: T11-12: Unremarkable. T12-L1: Unremarkable. L1-2: Mild diffuse, concentric disc protrusion without significant canal or foraminal stenosis. L2-3: Mild  diffuse, concentric disc protrusion without significant canal or foraminal stenosis. L3-4: Mild diffuse concentric disc protrusion and associated spur formation. Facet spur formation on the left, causing mild to moderate lateral recess stenosis on the left. L4-5: Mild-to-moderate diffuse, concentric disc protrusion, mild bilateral ligamentum flavum hypertrophy, moderate facet hypertrophy on the right and mild facet hypertrophy on the left. These changes are producing mild canal stenosis, moderate to marked foraminal stenosis on the right and moderate foraminal stenosis on the left. L5-S1: Mild to moderate diffuse, concentric disc protrusion and associated spur formation, extending more toward the right than toward the left posteriorly in the spinal canal. There is also bilateral facet hypertrophy. These changes are producing marked foraminal stenosis on the right and moderate foraminal stenosis on the left. IMPRESSION: 1. No acute abnormality. 2. Multilevel degenerative changes, as described above. 3. Small left pleural effusion. Electronically Signed   By: Claudie Revering M.D.   On: 05/11/2019 20:34   Dg Pelvis Portable  Result Date: 05/11/2019 CLINICAL DATA:  Low back and right leg pain following a fall 5 days ago. EXAM: PORTABLE PELVIS 1-2 VIEWS COMPARISON:  None. FINDINGS: Normal appearing pelvic bones and hips with no fracture or dislocation. Lower lumbar spine degenerative changes and mild scoliosis. IMPRESSION: No fracture or dislocation. Electronically Signed   By: Claudie Revering M.D.   On: 05/11/2019 17:15   Dg Chest Port 1 View  Result Date: 05/11/2019 CLINICAL DATA:  Cough and fever. Recent fall. EXAM: PORTABLE CHEST 1 VIEW COMPARISON:  04/19/2005. FINDINGS: Normal sized heart. Patchy airspace opacity and pleural fluid at the left lung base. Clear right lung. Unremarkable bones. IMPRESSION: Left basilar pneumonia and small left pleural effusion. Electronically Signed   By: Claudie Revering M.D.   On:  05/11/2019 17:14   Dg Femur Min 2 Views Left  Result Date: 05/11/2019 CLINICAL DATA:  Pain secondary to a fall 3 days ago. EXAM: LEFT FEMUR 2 VIEWS COMPARISON:  None. FINDINGS: There is no evidence of fracture or other focal bone lesions. Soft tissues are unremarkable. IMPRESSION: Negative. Electronically Signed   By: Lorriane Shire M.D.   On: 05/11/2019 17:26   Dg Femur Min 2 Views Right  Result Date: 05/11/2019 CLINICAL DATA:  Low back and right leg pain following a fall 5 days ago. EXAM: RIGHT FEMUR 2 VIEWS COMPARISON:  None. FINDINGS: There is no evidence of fracture or other focal bone lesions. Soft tissues are unremarkable. IMPRESSION: No fracture. Electronically Signed   By: Claudie Revering M.D.   On: 05/11/2019 17:20        Scheduled Meds:  enoxaparin (LOVENOX) injection  40 mg Subcutaneous Q24H   feeding supplement (ENSURE ENLIVE)  237 mL Oral BID BM   sodium chloride flush  3 mL Intravenous Q12H   Continuous Infusions:  sodium chloride     azithromycin Stopped (05/12/19 0022)  cefTRIAXone (ROCEPHIN)  IV Stopped (05/11/19 2239)     LOS: 0 days   Time spent= 35 mins    Lucielle Vokes Arsenio Loader, MD [facility]  If 7PM-7AM, please contact night-coverage [email or web address] 05/12/2019, 2:32 PM

## 2019-05-12 NOTE — Progress Notes (Signed)
PHARMACY - PHYSICIAN COMMUNICATION CRITICAL VALUE ALERT - BLOOD CULTURE IDENTIFICATION (BCID)  Results for orders placed or performed during the hospital encounter of 05/11/19  Blood Culture ID Panel (Reflexed) (Collected: 05/11/2019  5:00 PM)  Result Value Ref Range   Enterococcus species NOT DETECTED NOT DETECTED   Listeria monocytogenes NOT DETECTED NOT DETECTED   Staphylococcus species DETECTED (A) NOT DETECTED   Staphylococcus aureus (BCID) NOT DETECTED NOT DETECTED   Methicillin resistance NOT DETECTED NOT DETECTED   Streptococcus species DETECTED (A) NOT DETECTED   Streptococcus agalactiae NOT DETECTED NOT DETECTED   Streptococcus pneumoniae DETECTED (A) NOT DETECTED   Streptococcus pyogenes NOT DETECTED NOT DETECTED   Acinetobacter baumannii NOT DETECTED NOT DETECTED   Enterobacteriaceae species NOT DETECTED NOT DETECTED   Enterobacter cloacae complex NOT DETECTED NOT DETECTED   Escherichia coli NOT DETECTED NOT DETECTED   Klebsiella oxytoca NOT DETECTED NOT DETECTED   Klebsiella pneumoniae NOT DETECTED NOT DETECTED   Proteus species NOT DETECTED NOT DETECTED   Serratia marcescens NOT DETECTED NOT DETECTED   Haemophilus influenzae NOT DETECTED NOT DETECTED   Neisseria meningitidis NOT DETECTED NOT DETECTED   Pseudomonas aeruginosa NOT DETECTED NOT DETECTED   Candida albicans NOT DETECTED NOT DETECTED   Candida glabrata NOT DETECTED NOT DETECTED   Candida krusei NOT DETECTED NOT DETECTED   Candida parapsilosis NOT DETECTED NOT DETECTED   Candida tropicalis NOT DETECTED NOT DETECTED    Name of physician (or Provider) Contacted: Amin  Changes to prescribed antibiotics required: Increase ceftriaxone to 2g q24 hours  Georgina Peer 05/12/2019  5:47 PM

## 2019-05-13 DIAGNOSIS — E43 Unspecified severe protein-calorie malnutrition: Secondary | ICD-10-CM

## 2019-05-13 LAB — CBC
HCT: 31.4 % — ABNORMAL LOW (ref 39.0–52.0)
Hemoglobin: 10.7 g/dL — ABNORMAL LOW (ref 13.0–17.0)
MCH: 30.2 pg (ref 26.0–34.0)
MCHC: 34.1 g/dL (ref 30.0–36.0)
MCV: 88.7 fL (ref 80.0–100.0)
Platelets: 271 10*3/uL (ref 150–400)
RBC: 3.54 MIL/uL — ABNORMAL LOW (ref 4.22–5.81)
RDW: 13.9 % (ref 11.5–15.5)
WBC: 14 10*3/uL — ABNORMAL HIGH (ref 4.0–10.5)
nRBC: 0 % (ref 0.0–0.2)

## 2019-05-13 LAB — PROCALCITONIN: Procalcitonin: 0.39 ng/mL

## 2019-05-13 LAB — BASIC METABOLIC PANEL
Anion gap: 10 (ref 5–15)
BUN: 14 mg/dL (ref 6–20)
CO2: 24 mmol/L (ref 22–32)
Calcium: 8.2 mg/dL — ABNORMAL LOW (ref 8.9–10.3)
Chloride: 99 mmol/L (ref 98–111)
Creatinine, Ser: 0.94 mg/dL (ref 0.61–1.24)
GFR calc Af Amer: >60 mL/min (ref >60)
GFR calc non Af Amer: >60 mL/min (ref >60)
Glucose, Bld: 158 mg/dL — ABNORMAL HIGH (ref 70–99)
Potassium: 4.3 mmol/L (ref 3.5–5.1)
Sodium: 133 mmol/L — ABNORMAL LOW (ref 135–145)

## 2019-05-13 LAB — LEGIONELLA PNEUMOPHILA SEROGP 1 UR AG: L. pneumophila Serogp 1 Ur Ag: NEGATIVE

## 2019-05-13 LAB — BRAIN NATRIURETIC PEPTIDE: B Natriuretic Peptide: 356.8 pg/mL — ABNORMAL HIGH (ref 0.0–100.0)

## 2019-05-13 LAB — MAGNESIUM: Magnesium: 2.3 mg/dL (ref 1.7–2.4)

## 2019-05-13 LAB — EXPECTORATED SPUTUM ASSESSMENT W GRAM STAIN, RFLX TO RESP C

## 2019-05-13 MED ORDER — POLYVINYL ALCOHOL 1.4 % OP SOLN
1.0000 [drp] | OPHTHALMIC | Status: DC | PRN
  Administered 2019-05-13: 1 [drp] via OPHTHALMIC
  Filled 2019-05-13: qty 15

## 2019-05-13 MED ORDER — IPRATROPIUM-ALBUTEROL 0.5-2.5 (3) MG/3ML IN SOLN
3.0000 mL | Freq: Three times a day (TID) | RESPIRATORY_TRACT | Status: DC
  Administered 2019-05-13 – 2019-05-16 (×10): 3 mL via RESPIRATORY_TRACT
  Filled 2019-05-13 (×10): qty 3

## 2019-05-13 NOTE — Progress Notes (Signed)
This chaplain completed F/U spiritual care visit  to complete Pt. AD.  Chaplain education is complete and the document was left in the Pt. Room. The Pt. is going to continue conversation with MD and talk to his brother.  The Pt. will contact the spiritual care office at the appropriate time to complete his AD.

## 2019-05-13 NOTE — Progress Notes (Addendum)
PROGRESS NOTE    Randall Valencia  ERX:[id number] DOB: 1961/10/08 DOA: 05/11/2019 PCP: Patient, No Pcp Per   Brief Narrative:  58 year old with history of cerebral aneurysm, tobacco use came to the hospital with complains of generalized fever, aches and chills.  He reports of chronic cough due to smoking.  In the ER he was noted to be tachypneic with abnormal breath sounds.  Saturating well on room air.  Chest x-ray showed concerning for left basilar pneumonia.  Inflammatory markers slightly elevated.  CT of the head negative.  Also reported of back pain therefore CT lumbar spine which is negative.  Chemistry showed sodium 129, leukocytosis 16,000, elevated procalcitonin.  Started on broad-spectrum antibiotics and medical team was requested to admit the patient.  COVID was negative.   Assessment & Plan:   Principal Problem:   CAP (community acquired pneumonia) Active Problems:   Hyponatremia   Severe protein-calorie malnutrition (HCC)   Bronchospasm, acute   Tobacco use   Sepsis secondary to left lower lobe pneumonia, POA Hyponatremia secondary to mild to moderate dehydration, improved with hydration - Follow-up culture data.  Continue Roc D2, stop Azithro -If MRSA negative- discontinue vancomycin - BNP slightly elevated.  Procalcitonin trending down. -Bronchodilators scheduled and as necessary -Incentive spirometry and flutter valve. - Hyponatremia pretty much resolved with hydration - Urine strep-negative, COVID-negative -Legionella-pending -HIV-negative, UA-negative -D-dimer elevated, CTA chest is negative for PE but shows mucous plugging and reactive lymphadenopathy.  Needs repeat CAT scan in about 3 months to ensure resolution of this.  Gram-positive cocci and gram-positive rod bacteremia - No previous history of IV drug use according to him.  Will repeat blood cultures today to ensure clearance.Awaiting sensitivity, monitor vital signs.  If necessary will add vancomycin back  again  Bronchospasm secondary to community-acquired pneumonia Active tobacco use, suspect underlying COPD - Solu-Medrol IV every 8 hours, bronchodilators. -Will need outpatient PFTs -Nicotine patch as necessary.  Counseled to quit smoking cigarettes  Severe protein calorie malnutrition -Nutrition team has been consulted.  Prealbumin less than 5  DVT prophylaxis: Lovenox Code Status: Full code Family Communication: None at bedside Disposition Plan: Significant amount of poor respiratory symptoms with very abnormal breath sounds.  Also has gram-positive bacteremia which needs evaluation.  Consultants:   None  Procedures:   None  Antimicrobials:   Rocephin day 2  Azithromycin day 2, stopped 6/4   Subjective: States his breathing is slightly better than yesterday but easily gets exertional shortness of breath.  Bringing up quite a bit of mucus with lots of coughing.  Review of Systems Otherwise negative except as per HPI, including: General = no fevers, chills, dizziness, malaise, fatigue HEENT/EYES = negative for pain, redness, loss of vision, double vision, blurred vision, loss of hearing, sore throat, hoarseness, dysphagia Cardiovascular= negative for chest pain, palpitation, murmurs, lower extremity swelling Respiratory/lungs= negative for hemoptysis Gastrointestinal= negative for nausea, vomiting,, abdominal pain, melena, hematemesis Genitourinary= negative for Dysuria, Hematuria, Change in Urinary Frequency MSK = Negative for arthralgia, myalgias, Back Pain, Joint swelling  Neurology= Negative for headache, seizures, numbness, tingling  Psychiatry= Negative for anxiety, depression, suicidal and homocidal ideation Allergy/Immunology= Medication/Food allergy as listed  Skin= Negative for Rash, lesions, ulcers, itching   Objective: Vitals:   05/12/19 2320 05/13/19 0730 05/13/19 0746 05/13/19 0752  BP: 109/72 (!) 121/105    Pulse: 69 75 77   Resp: 20 18 18    Temp:  98.5 F (36.9 C) 97.7 F (36.5 C)    TempSrc:  Oral  SpO2: 95% 98% 98% 98%  Weight:      Height:        Intake/Output Summary (Last 24 hours) at 05/13/2019 1017 Last data filed at 05/13/2019 0300 Gross per 24 hour  Intake 0 ml  Output 1000 ml  Net -1000 ml   Filed Weights   05/11/19 1731 05/12/19 0057  Weight: 77.1 kg 72.6 kg    Examination: Constitutional: NAD, calm, comfortable.  notable short of breath with walking in the room Eyes: PERRL, lids and conjunctivae normal ENMT: Mucous membranes are moist. Posterior pharynx clear of any exudate or lesions.Normal dentition.  Neck: normal, supple, no masses, no thyromegaly Respiratory: Diffuse coarse breath sounds Cardiovascular: Regular rate and rhythm, no murmurs / rubs / gallops. No extremity edema. 2+ pedal pulses. No carotid bruits.  Abdomen: no tenderness, no masses palpated. No hepatosplenomegaly. Bowel sounds positive.  Musculoskeletal: no clubbing / cyanosis. No joint deformity upper and lower extremities. Good ROM, no contractures. Normal muscle tone.  Skin: no rashes, lesions, ulcers. No induration Neurologic: CN 2-12 grossly intact. Sensation intact, DTR normal. Strength 5/5 in all 4.  Psychiatric: Normal judgment and insight. Alert and oriented x 3. Normal mood.      Data Reviewed:   CBC: Recent Labs  Lab 05/11/19 1700 05/12/19 0527 05/13/19 0705  WBC 16.5* 14.2* 14.0*  NEUTROABS 13.7* 11.0*  --   HGB 11.5* 10.9* 10.7*  HCT 35.0* 33.2* 31.4*  MCV 90.0 90.0 88.7  PLT 274 205 878   Basic Metabolic Panel: Recent Labs  Lab 05/11/19 1700 05/12/19 0527 05/13/19 0705  NA 129* 134* 133*  K 3.8 4.2 4.3  CL 94* 102 99  CO2 23 21* 24  GLUCOSE 103* 88 158*  BUN 15 15 14   CREATININE 1.14 1.03 0.94  CALCIUM 8.1* 7.9* 8.2*  MG  --   --  2.3   GFR: Estimated Creatinine Clearance: 86.9 mL/min (by C-G formula based on SCr of 0.94 mg/dL). Liver Function Tests: Recent Labs  Lab 05/11/19 1700 05/12/19 0527   AST 21 18  ALT 17 12  ALKPHOS 69 61  BILITOT 1.3* 0.8  PROT 6.1* 5.0*  ALBUMIN 2.6* 2.2*   Recent Labs  Lab 05/11/19 1700  LIPASE 30   No results for input(s): AMMONIA in the last 168 hours. Coagulation Profile: No results for input(s): INR, PROTIME in the last 168 hours. Cardiac Enzymes: No results for input(s): CKTOTAL, CKMB, CKMBINDEX, TROPONINI in the last 168 hours. BNP (last 3 results) No results for input(s): PROBNP in the last 8760 hours. HbA1C: No results for input(s): HGBA1C in the last 72 hours. CBG: No results for input(s): GLUCAP in the last 168 hours. Lipid Profile: No results for input(s): CHOL, HDL, LDLCALC, TRIG, CHOLHDL, LDLDIRECT in the last 72 hours. Thyroid Function Tests: No results for input(s): TSH, T4TOTAL, FREET4, T3FREE, THYROIDAB in the last 72 hours. Anemia Panel: Recent Labs    05/12/19 1040  FERRITIN 508*   Sepsis Labs: Recent Labs  Lab 05/11/19 1700 05/12/19 1040 05/13/19 0705  PROCALCITON  --  0.61 0.39  LATICACIDVEN 1.2  --   --     Recent Results (from the past 240 hour(s))  Blood Culture (routine x 2)     Status: Abnormal (Preliminary result)   Collection Time: 05/11/19  5:00 PM  Result Value Ref Range Status   Specimen Description SITE NOT SPECIFIED  Final   Special Requests   Final    BOTTLES DRAWN AEROBIC AND ANAEROBIC Blood Culture  adequate volume   Culture  Setup Time   Final    GRAM POSITIVE COCCI AEROBIC BOTTLE ONLY CRITICAL RESULT CALLED TO, READ BACK BY AND VERIFIED WITH: Tillman Sers [facility] 05/12/19 1737 JDW    Culture (A)  Final    STREPTOCOCCUS PNEUMONIAE CULTURE REINCUBATED FOR BETTER GROWTH Performed at [facility], [place] [address] [place], [place] [address]    Report Status PENDING  Incomplete  Blood Culture (routine x 2)     Status: None (Preliminary result)   Collection Time: 05/11/19  5:00 PM  Result Value Ref Range Status   Specimen Description SITE NOT SPECIFIED  Final   Special Requests    Final    BOTTLES DRAWN AEROBIC AND ANAEROBIC Blood Culture adequate volume   Culture   Final    NO GROWTH 2 DAYS Performed at [facility], [place] [address], [place], [place] [address]    Report Status PENDING  Incomplete  Blood Culture ID Panel (Reflexed)     Status: Abnormal   Collection Time: 05/11/19  5:00 PM  Result Value Ref Range Status   Enterococcus species NOT DETECTED NOT DETECTED Final   Listeria monocytogenes NOT DETECTED NOT DETECTED Final   Staphylococcus species DETECTED (A) NOT DETECTED Final    Comment: Methicillin (oxacillin) susceptible coagulase negative staphylococcus. Possible blood culture contaminant (unless isolated from more than one blood culture draw or clinical case suggests pathogenicity). No antibiotic treatment is indicated for blood  culture contaminants. CRITICAL RESULT CALLED TO, READ BACK BY AND VERIFIED WITH: F WILSON PHARMD 05/12/19 1737 JDW    Staphylococcus aureus (BCID) NOT DETECTED NOT DETECTED Final   Methicillin resistance NOT DETECTED NOT DETECTED Final   Streptococcus species DETECTED (A) NOT DETECTED Final    Comment: CRITICAL RESULT CALLED TO, READ BACK BY AND VERIFIED WITH: F WILSON PHARMD 05/12/19 1737 JDW    Streptococcus agalactiae NOT DETECTED NOT DETECTED Final   Streptococcus pneumoniae DETECTED (A) NOT DETECTED Final    Comment: CRITICAL RESULT CALLED TO, READ BACK BY AND VERIFIED WITH: Tillman Sers PHARMD 05/12/19 1737 JDW    Streptococcus pyogenes NOT DETECTED NOT DETECTED Final   Acinetobacter baumannii NOT DETECTED NOT DETECTED Final   Enterobacteriaceae species NOT DETECTED NOT DETECTED Final   Enterobacter cloacae complex NOT DETECTED NOT DETECTED Final   Escherichia coli NOT DETECTED NOT DETECTED Final   Klebsiella oxytoca NOT DETECTED NOT DETECTED Final   Klebsiella pneumoniae NOT DETECTED NOT DETECTED Final   Proteus species NOT DETECTED NOT DETECTED Final   Serratia marcescens NOT DETECTED NOT DETECTED Final    Haemophilus influenzae NOT DETECTED NOT DETECTED Final   Neisseria meningitidis NOT DETECTED NOT DETECTED Final   Pseudomonas aeruginosa NOT DETECTED NOT DETECTED Final   Candida albicans NOT DETECTED NOT DETECTED Final   Candida glabrata NOT DETECTED NOT DETECTED Final   Candida krusei NOT DETECTED NOT DETECTED Final   Candida parapsilosis NOT DETECTED NOT DETECTED Final   Candida tropicalis NOT DETECTED NOT DETECTED Final    Comment: Performed at [facility], [place] [address], [place], [place] [address]  SARS Coronavirus 2 (CEPHEID- Performed in [place] hospital lab), Hosp Order     Status: None   Collection Time: 05/11/19  5:02 PM  Result Value Ref Range Status   SARS Coronavirus 2 NEGATIVE NEGATIVE Final    Comment: (NOTE) If result is NEGATIVE SARS-CoV-2 target nucleic acids are NOT DETECTED. The SARS-CoV-2 RNA is generally detectable in upper and lower  respiratory specimens during the acute phase of infection. The lowest  concentration of SARS-CoV-2 viral copies this assay can detect is 250  copies / mL. A negative result does not preclude SARS-CoV-2 infection  and should not be used as the sole basis for treatment or other  patient management decisions.  A negative result may occur with  improper specimen collection / handling, submission of specimen other  than nasopharyngeal swab, presence of viral mutation(s) within the  areas targeted by this assay, and inadequate number of viral copies  (<250 copies / mL). A negative result must be combined with clinical  observations, patient history, and epidemiological information. If result is POSITIVE SARS-CoV-2 target nucleic acids are DETECTED. The SARS-CoV-2 RNA is generally detectable in upper and lower  respiratory specimens dur ing the acute phase of infection.  Positive  results are indicative of active infection with SARS-CoV-2.  Clinical  correlation with patient history and other diagnostic information is   necessary to determine patient infection status.  Positive results do  not rule out bacterial infection or co-infection with other viruses. If result is PRESUMPTIVE POSTIVE SARS-CoV-2 nucleic acids MAY BE PRESENT.   A presumptive positive result was obtained on the submitted specimen  and confirmed on repeat testing.  While 2019 novel coronavirus  (SARS-CoV-2) nucleic acids may be present in the submitted sample  additional confirmatory testing may be necessary for epidemiological  and / or clinical management purposes  to differentiate between  SARS-CoV-2 and other Sarbecovirus currently known to infect humans.  If clinically indicated additional testing with an alternate test  methodology [phone]) is advised. The SARS-CoV-2 RNA is generally  detectable in upper and lower respiratory sp ecimens during the acute  phase of infection. The expected result is Negative. Fact Sheet for Patients:  StrictlyIdeas.no Fact Sheet for Healthcare Providers: BankingDealers.co.za This test is not yet approved or cleared by the [place] FDA and has been authorized for detection and/or diagnosis of SARS-CoV-2 by FDA under an Emergency Use Authorization (EUA).  This EUA will remain in effect (meaning this test can be used) for the duration of the COVID-19 declaration under Section 564(b)(1) of the Act, 21 U.S.C. section 360bbb-3(b)(1), unless the authorization is terminated or revoked sooner. Performed at [facility], [place] [address], [place], [place] [address]   Culture, sputum-assessment     Status: None   Collection Time: 05/13/19  5:13 AM  Result Value Ref Range Status   Specimen Description SPUTUM  Final   Special Requests NONE  Final   Sputum evaluation   Final    THIS SPECIMEN IS ACCEPTABLE FOR SPUTUM CULTURE Performed at [facility], [address] [place], [place] [address]    Report Status 05/13/2019 FINAL  Final   Culture, respiratory     Status: None (Preliminary result)   Collection Time: 05/13/19  5:13 AM  Result Value Ref Range Status   Specimen Description SPUTUM  Final   Special Requests NONE Reflexed from [phone]  Final   Gram Stain   Final    ABUNDANT WBC PRESENT, PREDOMINANTLY PMN MODERATE GRAM POSITIVE COCCI FEW GRAM POSITIVE RODS Performed at [facility], [place] [address] [place], [place] [address]    Culture PENDING  Incomplete   Report Status PENDING  Incomplete         Radiology Studies: Ct Head Wo Contrast  Result Date: 05/11/2019 CLINICAL DATA:  58 year old male with head trauma. EXAM: CT HEAD WITHOUT CONTRAST TECHNIQUE: Contiguous  axial images were obtained from the base of the skull through the vertex without intravenous contrast. COMPARISON:  Head CT dated 05/23/2005 FINDINGS: Brain: The ventricles and sulci appropriate size for patient's age. Minimal periventricular and deep white matter chronic microvascular ischemic changes noted. There is no acute intracranial hemorrhage. No mass effect or midline shift. No extra-axial fluid collection. Vascular: No hyperdense vessel or unexpected calcification. Skull: Normal. Negative for fracture or focal lesion. Sinuses/Orbits: No acute finding. Other: None IMPRESSION: No acute intracranial pathology. Electronically Signed   By: Anner Crete M.D.   On: 05/11/2019 20:26   Ct Angio Chest Pe W Or Wo Contrast  Result Date: 05/12/2019 CLINICAL DATA:  Fever, aches, chills, cough, tachypnea, abnormal chest radiograph EXAM: CT ANGIOGRAPHY CHEST WITH CONTRAST TECHNIQUE: Multidetector CT imaging of the chest was performed using the standard protocol during bolus administration of intravenous contrast. Multiplanar CT image reconstructions and MIPs were obtained to evaluate the vascular anatomy. CONTRAST:  168mL OMNIPAQUE IOHEXOL 350 MG/ML SOLN COMPARISON:  Chest radiograph, 05/11/2019 FINDINGS: Cardiovascular: Satisfactory opacification of the  pulmonary arteries to the segmental level. No evidence of pulmonary embolism. Cardiomegaly. Three-vessel coronary artery calcifications. No pericardial effusion. Mediastinum/Nodes: There are enlarged, calcified left hilar lymph nodes (series 5, image 79). There are prominent mediastinal and AP window lymph nodes, somewhat difficult to evaluate on arterial phase pulmonary angiogram. Thyroid gland, trachea, and esophagus demonstrate no significant findings. Lungs/Pleura: Mild centrilobular emphysema. There is a masslike consolidation of the dependent left lung base measuring approximately 4.0 by 3.4 cm (series 6, image 111) with adjacent bronchiectasis, bronchial wall thickening, and plugging. There is a small associated left pleural effusion. Upper Abdomen: No acute abnormality. Musculoskeletal: No chest wall abnormality. No acute or significant osseous findings. Review of the MIP images confirms the above findings. IMPRESSION: 1.  Negative examination for pulmonary embolism. 2. There is a masslike consolidation of the dependent left lung base measuring approximately 4.0 by 3.4 cm (series 6, image 111) with adjacent bronchiectasis, bronchial wall thickening, and plugging. There is a small associated left pleural effusion. Constellation of findings generally favors aspiration, however underlying mass is not strictly excluded. Recommend follow-up CT in 6-8 weeks to ensure resolution. 3. There are enlarged, calcified left hilar lymph nodes (series 5, image 79). There are prominent mediastinal and AP window lymph nodes, somewhat difficult to evaluate on arterial phase pulmonary angiogram. These may be reactive. Attention on follow-up. 4.  Emphysema. 5.  Coronary artery disease. Electronically Signed   By: Eddie Candle M.D.   On: 05/12/2019 17:56   Ct Lumbar Spine Wo Contrast  Result Date: 05/11/2019 CLINICAL DATA:  Low back and right leg pain following a fall 5 days ago. Gross hematuria. Fever. EXAM: CT LUMBAR SPINE  WITHOUT CONTRAST TECHNIQUE: Multidetector CT imaging of the lumbar spine was performed without intravenous contrast administration. Multiplanar CT image reconstructions were also generated. COMPARISON:  Lumbar spine radiographs dated 07/03/2010. FINDINGS: Segmentation: 5 non-rib-bearing lumbar vertebrae. Alignment: Stable mild retrolisthesis at the L2-3 level and L3-4 level and mild retrolisthesis at the L5-S1 level. Interval minimal anterolisthesis at the L4-5 level. Mild levoconvex scoliosis in the lower lumbar spine. Vertebrae: No acute fracture or focal pathologic process. Paraspinal and other soft tissues: Unremarkable. A small left pleural effusion is noted. Disc levels: T11-12: Unremarkable. T12-L1: Unremarkable. L1-2: Mild diffuse, concentric disc protrusion without significant canal or foraminal stenosis. L2-3: Mild diffuse, concentric disc protrusion without significant canal or foraminal stenosis. L3-4: Mild diffuse concentric disc protrusion and associated spur formation.  Facet spur formation on the left, causing mild to moderate lateral recess stenosis on the left. L4-5: Mild-to-moderate diffuse, concentric disc protrusion, mild bilateral ligamentum flavum hypertrophy, moderate facet hypertrophy on the right and mild facet hypertrophy on the left. These changes are producing mild canal stenosis, moderate to marked foraminal stenosis on the right and moderate foraminal stenosis on the left. L5-S1: Mild to moderate diffuse, concentric disc protrusion and associated spur formation, extending more toward the right than toward the left posteriorly in the spinal canal. There is also bilateral facet hypertrophy. These changes are producing marked foraminal stenosis on the right and moderate foraminal stenosis on the left. IMPRESSION: 1. No acute abnormality. 2. Multilevel degenerative changes, as described above. 3. Small left pleural effusion. Electronically Signed   By: Claudie Revering M.D.   On: 05/11/2019  20:34   Dg Pelvis Portable  Result Date: 05/11/2019 CLINICAL DATA:  Low back and right leg pain following a fall 5 days ago. EXAM: PORTABLE PELVIS 1-2 VIEWS COMPARISON:  None. FINDINGS: Normal appearing pelvic bones and hips with no fracture or dislocation. Lower lumbar spine degenerative changes and mild scoliosis. IMPRESSION: No fracture or dislocation. Electronically Signed   By: Claudie Revering M.D.   On: 05/11/2019 17:15   Dg Chest Port 1 View  Result Date: 05/11/2019 CLINICAL DATA:  Cough and fever. Recent fall. EXAM: PORTABLE CHEST 1 VIEW COMPARISON:  04/19/2005. FINDINGS: Normal sized heart. Patchy airspace opacity and pleural fluid at the left lung base. Clear right lung. Unremarkable bones. IMPRESSION: Left basilar pneumonia and small left pleural effusion. Electronically Signed   By: Claudie Revering M.D.   On: 05/11/2019 17:14   Dg Femur Min 2 Views Left  Result Date: 05/11/2019 CLINICAL DATA:  Pain secondary to a fall 3 days ago. EXAM: LEFT FEMUR 2 VIEWS COMPARISON:  None. FINDINGS: There is no evidence of fracture or other focal bone lesions. Soft tissues are unremarkable. IMPRESSION: Negative. Electronically Signed   By: Lorriane Shire M.D.   On: 05/11/2019 17:26   Dg Femur Min 2 Views Right  Result Date: 05/11/2019 CLINICAL DATA:  Low back and right leg pain following a fall 5 days ago. EXAM: RIGHT FEMUR 2 VIEWS COMPARISON:  None. FINDINGS: There is no evidence of fracture or other focal bone lesions. Soft tissues are unremarkable. IMPRESSION: No fracture. Electronically Signed   By: Claudie Revering M.D.   On: 05/11/2019 17:20        Scheduled Meds:  budesonide (PULMICORT) nebulizer solution  0.5 mg Nebulization BID   enoxaparin (LOVENOX) injection  40 mg Subcutaneous Q24H   feeding supplement (ENSURE ENLIVE)  237 mL Oral BID BM   ipratropium-albuterol  3 mL Nebulization TID   methylPREDNISolone (SOLU-MEDROL) injection  40 mg Intravenous Q8H   sodium chloride flush  3 mL  Intravenous Q12H   Continuous Infusions:  sodium chloride     azithromycin 500 mg (05/12/19 2133)   cefTRIAXone (ROCEPHIN)  IV 2 g (05/12/19 1843)     LOS: 1 day   Time spent= 35 mins    Randall Goodwill Arsenio Loader, MD [facility]  If 7PM-7AM, please contact night-coverage [email or web address] 05/13/2019, 10:17 AM

## 2019-05-14 DIAGNOSIS — B955 Unspecified streptococcus as the cause of diseases classified elsewhere: Secondary | ICD-10-CM

## 2019-05-14 LAB — CBC
HCT: 32.4 % — ABNORMAL LOW (ref 39.0–52.0)
Hemoglobin: 10.4 g/dL — ABNORMAL LOW (ref 13.0–17.0)
MCH: 29 pg (ref 26.0–34.0)
MCHC: 32.1 g/dL (ref 30.0–36.0)
MCV: 90.3 fL (ref 80.0–100.0)
Platelets: 290 10*3/uL (ref 150–400)
RBC: 3.59 MIL/uL — ABNORMAL LOW (ref 4.22–5.81)
RDW: 14.1 % (ref 11.5–15.5)
WBC: 15.4 10*3/uL — ABNORMAL HIGH (ref 4.0–10.5)
nRBC: 0 % (ref 0.0–0.2)

## 2019-05-14 LAB — BASIC METABOLIC PANEL
Anion gap: 9 (ref 5–15)
BUN: 17 mg/dL (ref 6–20)
CO2: 26 mmol/L (ref 22–32)
Calcium: 8.4 mg/dL — ABNORMAL LOW (ref 8.9–10.3)
Chloride: 102 mmol/L (ref 98–111)
Creatinine, Ser: 0.83 mg/dL (ref 0.61–1.24)
GFR calc Af Amer: >60 mL/min (ref >60)
GFR calc non Af Amer: >60 mL/min (ref >60)
Glucose, Bld: 124 mg/dL — ABNORMAL HIGH (ref 70–99)
Potassium: 4.7 mmol/L (ref 3.5–5.1)
Sodium: 137 mmol/L (ref 135–145)

## 2019-05-14 LAB — CULTURE, BLOOD (ROUTINE X 2): Special Requests: ADEQUATE

## 2019-05-14 LAB — MAGNESIUM: Magnesium: 2.5 mg/dL — ABNORMAL HIGH (ref 1.7–2.4)

## 2019-05-14 MED ORDER — NICOTINE 21 MG/24HR TD PT24
21.0000 mg | MEDICATED_PATCH | Freq: Every day | TRANSDERMAL | Status: DC
  Administered 2019-05-14 – 2019-05-17 (×4): 21 mg via TRANSDERMAL
  Filled 2019-05-14 (×4): qty 1

## 2019-05-14 MED ORDER — ALUM & MAG HYDROXIDE-SIMETH 200-200-20 MG/5ML PO SUSP
30.0000 mL | Freq: Once | ORAL | Status: AC
  Administered 2019-05-14: 30 mL via ORAL
  Filled 2019-05-14: qty 30

## 2019-05-14 MED ORDER — LIDOCAINE VISCOUS HCL 2 % MT SOLN
15.0000 mL | Freq: Once | OROMUCOSAL | Status: DC
  Filled 2019-05-14: qty 15

## 2019-05-14 NOTE — Progress Notes (Signed)
PROGRESS NOTE    Randall Valencia  NKN:[id number] DOB: [birth year] DOA: 05/11/2019 PCP: Patient, No Pcp Per   Brief Narrative:  58 year old with history of cerebral aneurysm, tobacco use came to the hospital with complains of generalized fever, aches and chills.  He reports of chronic cough due to smoking.  In the ER he was noted to be tachypneic with abnormal breath sounds.  Saturating well on room air.  Chest x-ray showed concerning for left basilar pneumonia.  Inflammatory markers slightly elevated.  CT of the head negative.  Also reported of back pain therefore CT lumbar spine which is negative.  Chemistry showed sodium 129, leukocytosis 16,000, elevated procalcitonin.  Started on broad-spectrum antibiotics and medical team was requested to admit the patient.  COVID was negative.   Assessment & Plan:   Principal Problem:   CAP (community acquired pneumonia) Active Problems:   Hyponatremia   Severe protein-calorie malnutrition (HCC)   Bronchospasm, acute   Tobacco use   Sepsis secondary to left lower lobe pneumonia, POA Hyponatremia secondary to mild to moderate dehydration, improved with hydration - Follow-up culture data.  Continue Roc D3, stop Azithro -If MRSA negative- discontinue vancomycin - BNP slightly elevated.  Procalcitonin trending down. -Bronchodilators scheduled and as necessary -Incentive spirometry and flutter valve. - Hyponatremia pretty much resolved with hydration - Urine strep-negative, COVID-negative -Legionella- negative -HIV-negative, UA-negative -D-dimer elevated, CTA chest is negative for PE but shows mucous plugging and reactive lymphadenopathy.  Needs repeat CAT scan in about 3 months to ensure resolution of this.  Streptococcus pneumonia bacteremia - No previous history of IV drug use according to him.  Initial blood cultures are positive for streptococcal bacteremia.  Surveillance cultures have been ordered on 6/4- denies currently pending. -Persistent  leukocytosis, partly related to Solu-Medrol.  Remains afebrile we will continue current antibiotics.  Bronchospasm secondary to community-acquired pneumonia, persist Active tobacco use, suspect underlying COPD - We will continue Solu-Medrol every 8 hours and bronchodilators. -Will need outpatient PFTs -Nicotine patch as necessary.  Counseled to quit smoking cigarettes  Severe protein calorie malnutrition -Nutrition team has been consulted.  Prealbumin less than 5  DVT prophylaxis: Lovenox Code Status: Full code Family Communication: None at bedside Disposition Plan: Need to ensure his bacteremia has been clearing up and he remained stable.  In the meantime we will have to ensure his breathing has stabilized as well.  Consultants:   None  Procedures:   None  Antimicrobials:   Rocephin day 3  Azithromycin day 2, stopped 6/4   Subjective: Significant amount of coughing, somewhat productive.  Still has exertional shortness of breath.  Remains afebrile. Denies any previous drug use history.  Review of Systems Otherwise negative except as per HPI, including: General = no fevers, chills, dizziness, malaise, fatigue HEENT/EYES = negative for pain, redness, loss of vision, double vision, blurred vision, loss of hearing, sore throat, hoarseness, dysphagia Cardiovascular= negative for chest pain, palpitation, murmurs, lower extremity swelling Respiratory/lungs= negative for hemoptysis Gastrointestinal= negative for nausea, vomiting,, abdominal pain, melena, hematemesis Genitourinary= negative for Dysuria, Hematuria, Change in Urinary Frequency MSK = Negative for arthralgia, myalgias, Back Pain, Joint swelling  Neurology= Negative for headache, seizures, numbness, tingling  Psychiatry= Negative for anxiety, depression, suicidal and homocidal ideation Allergy/Immunology= Medication/Food allergy as listed  Skin= Negative for Rash, lesions, ulcers, itching    Objective: Vitals:    05/13/19 2102 05/13/19 2336 05/14/19 0905 05/14/19 0920  BP:  121/70 118/64   Pulse: 82 71 62 73  Resp: 16  18 18  Temp:  98.2 F (36.8 C) 98.4 F (36.9 C)   TempSrc:  Oral    SpO2:  97% 99% 97%  Weight:      Height:        Intake/Output Summary (Last 24 hours) at 05/14/2019 1224 Last data filed at 05/14/2019 0600 Gross per 24 hour  Intake 840 ml  Output 250 ml  Net 590 ml   Filed Weights   05/11/19 1731 05/12/19 0057  Weight: 77.1 kg 72.6 kg    Examination: Constitutional: NAD, calm, comfortable Eyes: PERRL, lids and conjunctivae normal ENMT: Mucous membranes are moist. Posterior pharynx clear of any exudate or lesions.Normal dentition.  Neck: normal, supple, no masses, no thyromegaly Respiratory: Diffuse coarse breath sounds Cardiovascular: Regular rate and rhythm, no murmurs / rubs / gallops. No extremity edema. 2+ pedal pulses. No carotid bruits.  Abdomen: no tenderness, no masses palpated. No hepatosplenomegaly. Bowel sounds positive.  Musculoskeletal: no clubbing / cyanosis. No joint deformity upper and lower extremities. Good ROM, no contractures. Normal muscle tone.  Skin: no rashes, lesions, ulcers. No induration Neurologic: CN 2-12 grossly intact. Sensation intact, DTR normal. Strength 5/5 in all 4.  Psychiatric: Normal judgment and insight. Alert and oriented x 3. Normal mood.      Data Reviewed:   CBC: Recent Labs  Lab 05/11/19 1700 05/12/19 0527 05/13/19 0705 05/14/19 0602  WBC 16.5* 14.2* 14.0* 15.4*  NEUTROABS 13.7* 11.0*  --   --   HGB 11.5* 10.9* 10.7* 10.4*  HCT 35.0* 33.2* 31.4* 32.4*  MCV 90.0 90.0 88.7 90.3  PLT 274 205 271 660   Basic Metabolic Panel: Recent Labs  Lab 05/11/19 1700 05/12/19 0527 05/13/19 0705 05/14/19 0602  NA 129* 134* 133* 137  K 3.8 4.2 4.3 4.7  CL 94* 102 99 102  CO2 23 21* 24 26  GLUCOSE 103* 88 158* 124*  BUN 15 15 14 17   CREATININE 1.14 1.03 0.94 0.83  CALCIUM 8.1* 7.9* 8.2* 8.4*  MG  --   --  2.3  2.5*   GFR: Estimated Creatinine Clearance: 98.4 mL/min (by C-G formula based on SCr of 0.83 mg/dL). Liver Function Tests: Recent Labs  Lab 05/11/19 1700 05/12/19 0527  AST 21 18  ALT 17 12  ALKPHOS 69 61  BILITOT 1.3* 0.8  PROT 6.1* 5.0*  ALBUMIN 2.6* 2.2*   Recent Labs  Lab 05/11/19 1700  LIPASE 30   No results for input(s): AMMONIA in the last 168 hours. Coagulation Profile: No results for input(s): INR, PROTIME in the last 168 hours. Cardiac Enzymes: No results for input(s): CKTOTAL, CKMB, CKMBINDEX, TROPONINI in the last 168 hours. BNP (last 3 results) No results for input(s): PROBNP in the last 8760 hours. HbA1C: No results for input(s): HGBA1C in the last 72 hours. CBG: No results for input(s): GLUCAP in the last 168 hours. Lipid Profile: No results for input(s): CHOL, HDL, LDLCALC, TRIG, CHOLHDL, LDLDIRECT in the last 72 hours. Thyroid Function Tests: No results for input(s): TSH, T4TOTAL, FREET4, T3FREE, THYROIDAB in the last 72 hours. Anemia Panel: Recent Labs    05/12/19 1040  FERRITIN 508*   Sepsis Labs: Recent Labs  Lab 05/11/19 1700 05/12/19 1040 05/13/19 0705  PROCALCITON  --  0.61 0.39  LATICACIDVEN 1.2  --   --     Recent Results (from the past 240 hour(s))  Blood Culture (routine x 2)     Status: Abnormal   Collection Time: 05/11/19  5:00 PM  Result  Value Ref Range Status   Specimen Description SITE NOT SPECIFIED  Final   Special Requests   Final    BOTTLES DRAWN AEROBIC AND ANAEROBIC Blood Culture adequate volume   Culture  Setup Time   Final    GRAM POSITIVE COCCI AEROBIC BOTTLE ONLY CRITICAL RESULT CALLED TO, READ BACK BY AND VERIFIED WITH: F WILSON PHARMD 05/12/19 1737 JDW    Culture (A)  Final    STREPTOCOCCUS PNEUMONIAE STAPHYLOCOCCUS SPECIES (COAGULASE NEGATIVE) THE SIGNIFICANCE OF ISOLATING THIS ORGANISM FROM A SINGLE SET OF BLOOD CULTURES WHEN MULTIPLE SETS ARE DRAWN IS UNCERTAIN. PLEASE NOTIFY THE MICROBIOLOGY DEPARTMENT  WITHIN ONE WEEK IF SPECIATION AND SENSITIVITIES ARE REQUIRED. Performed at [facility], [place] [address], [place], [place] [address]    Report Status 05/14/2019 FINAL  Final   Organism ID, Bacteria STREPTOCOCCUS PNEUMONIAE  Final      Susceptibility   Streptococcus pneumoniae - MIC*    ERYTHROMYCIN <=0.12 SENSITIVE Sensitive     LEVOFLOXACIN 1 SENSITIVE Sensitive     VANCOMYCIN <=0.12 SENSITIVE Sensitive     PENICILLIN (non-meningitis) <=0.06 SENSITIVE Sensitive     CEFTRIAXONE (non-meningitis) <=0.12 SENSITIVE Sensitive     * STREPTOCOCCUS PNEUMONIAE  Blood Culture (routine x 2)     Status: None (Preliminary result)   Collection Time: 05/11/19  5:00 PM  Result Value Ref Range Status   Specimen Description SITE NOT SPECIFIED  Final   Special Requests   Final    BOTTLES DRAWN AEROBIC AND ANAEROBIC Blood Culture adequate volume   Culture   Final    NO GROWTH 3 DAYS Performed at [facility], [address] [place], [place] [address]    Report Status PENDING  Incomplete  Blood Culture ID Panel (Reflexed)     Status: Abnormal   Collection Time: 05/11/19  5:00 PM  Result Value Ref Range Status   Enterococcus species NOT DETECTED NOT DETECTED Final   Listeria monocytogenes NOT DETECTED NOT DETECTED Final   Staphylococcus species DETECTED (A) NOT DETECTED Final    Comment: Methicillin (oxacillin) susceptible coagulase negative staphylococcus. Possible blood culture contaminant (unless isolated from more than one blood culture draw or clinical case suggests pathogenicity). No antibiotic treatment is indicated for blood  culture contaminants. CRITICAL RESULT CALLED TO, READ BACK BY AND VERIFIED WITH: Tillman Sers [facility] 05/12/19 1737 JDW    Staphylococcus aureus (BCID) NOT DETECTED NOT DETECTED Final   Methicillin resistance NOT DETECTED NOT DETECTED Final   Streptococcus species DETECTED (A) NOT DETECTED Final    Comment: CRITICAL RESULT CALLED TO, READ BACK BY AND VERIFIED  WITH: F WILSON PHARMD 05/12/19 1737 JDW    Streptococcus agalactiae NOT DETECTED NOT DETECTED Final   Streptococcus pneumoniae DETECTED (A) NOT DETECTED Final    Comment: CRITICAL RESULT CALLED TO, READ BACK BY AND VERIFIED WITH: Tillman Sers PHARMD 05/12/19 1737 JDW    Streptococcus pyogenes NOT DETECTED NOT DETECTED Final   Acinetobacter baumannii NOT DETECTED NOT DETECTED Final   Enterobacteriaceae species NOT DETECTED NOT DETECTED Final   Enterobacter cloacae complex NOT DETECTED NOT DETECTED Final   Escherichia coli NOT DETECTED NOT DETECTED Final   Klebsiella oxytoca NOT DETECTED NOT DETECTED Final   Klebsiella pneumoniae NOT DETECTED NOT DETECTED Final   Proteus species NOT DETECTED NOT DETECTED Final   Serratia marcescens NOT DETECTED NOT DETECTED Final   Haemophilus influenzae NOT DETECTED NOT DETECTED Final   Neisseria meningitidis NOT DETECTED NOT DETECTED Final   Pseudomonas aeruginosa NOT  DETECTED NOT DETECTED Final   Candida albicans NOT DETECTED NOT DETECTED Final   Candida glabrata NOT DETECTED NOT DETECTED Final   Candida krusei NOT DETECTED NOT DETECTED Final   Candida parapsilosis NOT DETECTED NOT DETECTED Final   Candida tropicalis NOT DETECTED NOT DETECTED Final    Comment: Performed at [facility], [place] [address] [place], [place] [address]  SARS Coronavirus 2 (CEPHEID- Performed in [place] [facility] lab), Hosp Order     Status: None   Collection Time: 05/11/19  5:02 PM  Result Value Ref Range Status   SARS Coronavirus 2 NEGATIVE NEGATIVE Final    Comment: (NOTE) If result is NEGATIVE SARS-CoV-2 target nucleic acids are NOT DETECTED. The SARS-CoV-2 RNA is generally detectable in upper and lower  respiratory specimens during the acute phase of infection. The lowest  concentration of SARS-CoV-2 viral copies this assay can detect is 250  copies / mL. A negative result does not preclude SARS-CoV-2 infection  and should not be used as the sole basis for  treatment or other  patient management decisions.  A negative result may occur with  improper specimen collection / handling, submission of specimen other  than nasopharyngeal swab, presence of viral mutation(s) within the  areas targeted by this assay, and inadequate number of viral copies  (<250 copies / mL). A negative result must be combined with clinical  observations, patient history, and epidemiological information. If result is POSITIVE SARS-CoV-2 target nucleic acids are DETECTED. The SARS-CoV-2 RNA is generally detectable in upper and lower  respiratory specimens dur ing the acute phase of infection.  Positive  results are indicative of active infection with SARS-CoV-2.  Clinical  correlation with patient history and other diagnostic information is  necessary to determine patient infection status.  Positive results do  not rule out bacterial infection or co-infection with other viruses. If result is PRESUMPTIVE POSTIVE SARS-CoV-2 nucleic acids MAY BE PRESENT.   A presumptive positive result was obtained on the submitted specimen  and confirmed on repeat testing.  While 2019 novel coronavirus  (SARS-CoV-2) nucleic acids may be present in the submitted sample  additional confirmatory testing may be necessary for epidemiological  and / or clinical management purposes  to differentiate between  SARS-CoV-2 and other Sarbecovirus currently known to infect humans.  If clinically indicated additional testing with an alternate test  methodology [phone]) is advised. The SARS-CoV-2 RNA is generally  detectable in upper and lower respiratory sp ecimens during the acute  phase of infection. The expected result is Negative. Fact Sheet for Patients:  StrictlyIdeas.no Fact Sheet for Healthcare Providers: BankingDealers.co.za This test is not yet approved or cleared by the [place] FDA and has been authorized for detection and/or  diagnosis of SARS-CoV-2 by FDA under an Emergency Use Authorization (EUA).  This EUA will remain in effect (meaning this test can be used) for the duration of the COVID-19 declaration under Section 564(b)(1) of the Act, 21 U.S.C. section 360bbb-3(b)(1), unless the authorization is terminated or revoked sooner. Performed at [facility], [place] [address], [place], [place] [address]   Culture, sputum-assessment     Status: None   Collection Time: 05/13/19  5:13 AM  Result Value Ref Range Status   Specimen Description SPUTUM  Final   Special Requests NONE  Final   Sputum evaluation   Final    THIS SPECIMEN IS ACCEPTABLE FOR SPUTUM CULTURE Performed at [facility], [address], [place], [place] [address]  Report Status 05/13/2019 FINAL  Final  Culture, respiratory     Status: None (Preliminary result)   Collection Time: 05/13/19  5:13 AM  Result Value Ref Range Status   Specimen Description SPUTUM  Final   Special Requests NONE Reflexed from [phone]  Final   Gram Stain   Final    ABUNDANT WBC PRESENT, PREDOMINANTLY PMN MODERATE GRAM POSITIVE COCCI FEW GRAM POSITIVE RODS    Culture   Final    CULTURE REINCUBATED FOR BETTER GROWTH Performed at [facility], [address], [place], [place] [address]    Report Status PENDING  Incomplete  Culture, blood (routine x 2)     Status: None (Preliminary result)   Collection Time: 05/13/19 10:53 AM  Result Value Ref Range Status   Specimen Description BLOOD RIGHT ANTECUBITAL  Final   Special Requests   Final    BOTTLES DRAWN AEROBIC AND ANAEROBIC Blood Culture adequate volume   Culture   Final    NO GROWTH < 24 HOURS Performed at [facility], [place] [address], [place], [place] [address]    Report Status PENDING  Incomplete  Culture, blood (routine x 2)     Status: None (Preliminary result)   Collection Time: 05/13/19 10:53 AM  Result Value Ref Range Status   Specimen Description BLOOD LEFT ANTECUBITAL  Final    Special Requests   Final    BOTTLES DRAWN AEROBIC AND ANAEROBIC Blood Culture results may not be optimal due to an excessive volume of blood received in culture bottles   Culture   Final    NO GROWTH < 24 HOURS Performed at [facility], [place] [address], [place], [place] [address]    Report Status PENDING  Incomplete         Radiology Studies: Ct Angio Chest Pe W Or Wo Contrast  Result Date: 05/12/2019 CLINICAL DATA:  Fever, aches, chills, cough, tachypnea, abnormal chest radiograph EXAM: CT ANGIOGRAPHY CHEST WITH CONTRAST TECHNIQUE: Multidetector CT imaging of the chest was performed using the standard protocol during bolus administration of intravenous contrast. Multiplanar CT image reconstructions and MIPs were obtained to evaluate the vascular anatomy. CONTRAST:  162mL OMNIPAQUE IOHEXOL 350 MG/ML SOLN COMPARISON:  Chest radiograph, 05/11/2019 FINDINGS: Cardiovascular: Satisfactory opacification of the pulmonary arteries to the segmental level. No evidence of pulmonary embolism. Cardiomegaly. Three-vessel coronary artery calcifications. No pericardial effusion. Mediastinum/Nodes: There are enlarged, calcified left hilar lymph nodes (series 5, image 79). There are prominent mediastinal and AP window lymph nodes, somewhat difficult to evaluate on arterial phase pulmonary angiogram. Thyroid gland, trachea, and esophagus demonstrate no significant findings. Lungs/Pleura: Mild centrilobular emphysema. There is a masslike consolidation of the dependent left lung base measuring approximately 4.0 by 3.4 cm (series 6, image 111) with adjacent bronchiectasis, bronchial wall thickening, and plugging. There is a small associated left pleural effusion. Upper Abdomen: No acute abnormality. Musculoskeletal: No chest wall abnormality. No acute or significant osseous findings. Review of the MIP images confirms the above findings. IMPRESSION: 1.  Negative examination for pulmonary embolism. 2. There is a  masslike consolidation of the dependent left lung base measuring approximately 4.0 by 3.4 cm (series 6, image 111) with adjacent bronchiectasis, bronchial wall thickening, and plugging. There is a small associated left pleural effusion. Constellation of findings generally favors aspiration, however underlying mass is not strictly excluded. Recommend follow-up CT in 6-8 weeks to ensure resolution. 3. There are enlarged, calcified left hilar lymph nodes (series 5, image 79). There  are prominent mediastinal and AP window lymph nodes, somewhat difficult to evaluate on arterial phase pulmonary angiogram. These may be reactive. Attention on follow-up. 4.  Emphysema. 5.  Coronary artery disease. Electronically Signed   By: Eddie Candle M.D.   On: 05/12/2019 17:56        Scheduled Meds:  budesonide (PULMICORT) nebulizer solution  0.5 mg Nebulization BID   enoxaparin (LOVENOX) injection  40 mg Subcutaneous Q24H   feeding supplement (ENSURE ENLIVE)  237 mL Oral BID BM   ipratropium-albuterol  3 mL Nebulization TID   methylPREDNISolone (SOLU-MEDROL) injection  40 mg Intravenous Q8H   nicotine  21 mg Transdermal Daily   sodium chloride flush  3 mL Intravenous Q12H   Continuous Infusions:  sodium chloride     cefTRIAXone (ROCEPHIN)  IV 2 g (05/13/19 1727)     LOS: 2 days   Time spent= 30 mins    Julann Mcgilvray Arsenio Loader, MD [facility]  If 7PM-7AM, please contact night-coverage [email or web address] 05/14/2019, 12:24 PM

## 2019-05-15 DIAGNOSIS — J9801 Acute bronchospasm: Secondary | ICD-10-CM

## 2019-05-15 DIAGNOSIS — R7881 Bacteremia: Secondary | ICD-10-CM

## 2019-05-15 LAB — CBC
HCT: 31.6 % — ABNORMAL LOW (ref 39.0–52.0)
Hemoglobin: 10.2 g/dL — ABNORMAL LOW (ref 13.0–17.0)
MCH: 29.2 pg (ref 26.0–34.0)
MCHC: 32.3 g/dL (ref 30.0–36.0)
MCV: 90.5 fL (ref 80.0–100.0)
Platelets: 322 10*3/uL (ref 150–400)
RBC: 3.49 MIL/uL — ABNORMAL LOW (ref 4.22–5.81)
RDW: 14.5 % (ref 11.5–15.5)
WBC: 12.3 10*3/uL — ABNORMAL HIGH (ref 4.0–10.5)
nRBC: 0 % (ref 0.0–0.2)

## 2019-05-15 LAB — BASIC METABOLIC PANEL
Anion gap: 8 (ref 5–15)
BUN: 22 mg/dL — ABNORMAL HIGH (ref 6–20)
CO2: 27 mmol/L (ref 22–32)
Calcium: 8.5 mg/dL — ABNORMAL LOW (ref 8.9–10.3)
Chloride: 103 mmol/L (ref 98–111)
Creatinine, Ser: 1.01 mg/dL (ref 0.61–1.24)
GFR calc Af Amer: >60 mL/min (ref >60)
GFR calc non Af Amer: >60 mL/min (ref >60)
Glucose, Bld: 169 mg/dL — ABNORMAL HIGH (ref 70–99)
Potassium: 5.1 mmol/L (ref 3.5–5.1)
Sodium: 138 mmol/L (ref 135–145)

## 2019-05-15 LAB — MAGNESIUM: Magnesium: 2.5 mg/dL — ABNORMAL HIGH (ref 1.7–2.4)

## 2019-05-15 NOTE — Progress Notes (Signed)
PROGRESS NOTE    Randall Valencia  XVQ:[id number] DOB: October 23, 1961 DOA: 05/11/2019 PCP: Patient, No Pcp Per   Brief Narrative:  58 year old with history of cerebral aneurysm, tobacco use came to the hospital with complains of generalized fever, aches and chills.  He reports of chronic cough due to smoking.  In the ER he was noted to be tachypneic with abnormal breath sounds.  Saturating well on room air.  Chest x-ray showed concerning for left basilar pneumonia.  Inflammatory markers slightly elevated.  CT of the head negative.  Also reported of back pain therefore CT lumbar spine which is negative.  Chemistry showed sodium 129, leukocytosis 16,000, elevated procalcitonin.  Started on broad-spectrum antibiotics and medical team was requested to admit the patient.  COVID was negative.  05/15/2019: Patient seen.  No new complaints.  Patient continues to improve slowly.  Patient is on IV Rocephin for strep pneumonia bacteremia/sepsis.  Assessment & Plan:   Principal Problem:   CAP (community acquired pneumonia) Active Problems:   Hyponatremia   Severe protein-calorie malnutrition (HCC)   Bronchospasm, acute   Tobacco use   Sepsis secondary to left lower lobe pneumonia, POA Hyponatremia secondary to mild to moderate dehydration, improved with hydration - Follow-up culture data.  Continue Roc D3, stop Azithro -If MRSA negative- discontinue vancomycin - BNP slightly elevated.  Procalcitonin trending down. -Bronchodilators scheduled and as necessary -Incentive spirometry and flutter valve. - Hyponatremia pretty much resolved with hydration - Urine strep-negative, COVID-negative -Legionella- negative -HIV-negative, UA-negative -D-dimer elevated, CTA chest is negative for PE but shows mucous plugging and reactive lymphadenopathy.  Needs repeat CAT scan in about 3 months to ensure resolution of this. 05/15/2019: Continue IV Rocephin.  Slowly improving.  Streptococcus pneumonia bacteremia - No  previous history of IV drug use according to him.  Initial blood cultures are positive for streptococcal bacteremia.  Surveillance cultures have been ordered on 6/4- denies currently pending. -Persistent leukocytosis, partly related to Solu-Medrol.  Remains afebrile we will continue current antibiotics. 05/15/2019: Complete course of antibiotics.   Bronchospasm secondary to community-acquired pneumonia, persist Active tobacco use, suspect underlying COPD - We will continue Solu-Medrol every 8 hours and bronchodilators. -Will need outpatient PFTs -Nicotine patch as necessary.  Counseled to quit smoking cigarettes 05/15/2019: Likely, patient has undiagnosed COPD with likely exacerbation.  Will await pulmonary team on discharge for formal testing.  Meanwhile, continue IV Solu-Medrol and nebulizer treatment for now.    Severe protein calorie malnutrition -Nutrition team has been consulted.  Prealbumin less than 5  DVT prophylaxis: Lovenox Code Status: Full code Family Communication: None at bedside Disposition Plan: Need to ensure his bacteremia has been clearing up and he remained stable.  In the meantime we will have to ensure his breathing has stabilized as well.  Consultants:   None  Procedures:   None  Antimicrobials:   Rocephin day 3  Azithromycin day 2, stopped 6/4   Subjective: Slowly improving. No fever or chills No shortness of breath  Objective: Vitals:   05/14/19 1414 05/14/19 1609 05/14/19 2339 05/15/19 0938  BP:  114/61 116/62 112/84  Pulse: 80 65 60 71  Resp: 16 16  18   Temp:  97.6 F (36.4 C) 97.9 F (36.6 C) 98.7 F (37.1 C)  TempSrc:  Oral Oral   SpO2: 96% 97% 99% 100%  Weight:      Height:        Intake/Output Summary (Last 24 hours) at 05/15/2019 1257 Last data filed at 05/15/2019 0944 Gross per  24 hour  Intake 580 ml  Output 750 ml  Net -170 ml   Filed Weights   05/11/19 1731 05/12/19 0057  Weight: 77.1 kg 72.6 kg    Examination:  Constitutional: NAD, calm, comfortable Eyes: PERRL, lids and conjunctivae normal ENMT: Mucous membranes are moist. Posterior pharynx clear of any exudate or lesions.Normal dentition.  Neck: normal, supple, no masses, no thyromegaly Respiratory: Decreased air entry.   Cardiovascular: S1-S2. Abdomen: no tenderness, no masses palpated. No hepatosplenomegaly. Bowel sounds positive.  Musculoskeletal: No leg edema. Neurologic: Awake and alert.  Patient moves all extremities.  Data Reviewed:   CBC: Recent Labs  Lab 05/11/19 1700 05/12/19 0527 05/13/19 0705 05/14/19 0602 05/15/19 0455  WBC 16.5* 14.2* 14.0* 15.4* 12.3*  NEUTROABS 13.7* 11.0*  --   --   --   HGB 11.5* 10.9* 10.7* 10.4* 10.2*  HCT 35.0* 33.2* 31.4* 32.4* 31.6*  MCV 90.0 90.0 88.7 90.3 90.5  PLT 274 205 271 290 993   Basic Metabolic Panel: Recent Labs  Lab 05/11/19 1700 05/12/19 0527 05/13/19 0705 05/14/19 0602 05/15/19 0455  NA 129* 134* 133* 137 138  K 3.8 4.2 4.3 4.7 5.1  CL 94* 102 99 102 103  CO2 23 21* 24 26 27   GLUCOSE 103* 88 158* 124* 169*  BUN 15 15 14 17  22*  CREATININE 1.14 1.03 0.94 0.83 1.01  CALCIUM 8.1* 7.9* 8.2* 8.4* 8.5*  MG  --   --  2.3 2.5* 2.5*   GFR: Estimated Creatinine Clearance: 80.9 mL/min (by C-G formula based on SCr of 1.01 mg/dL). Liver Function Tests: Recent Labs  Lab 05/11/19 1700 05/12/19 0527  AST 21 18  ALT 17 12  ALKPHOS 69 61  BILITOT 1.3* 0.8  PROT 6.1* 5.0*  ALBUMIN 2.6* 2.2*   Recent Labs  Lab 05/11/19 1700  LIPASE 30   No results for input(s): AMMONIA in the last 168 hours. Coagulation Profile: No results for input(s): INR, PROTIME in the last 168 hours. Cardiac Enzymes: No results for input(s): CKTOTAL, CKMB, CKMBINDEX, TROPONINI in the last 168 hours. BNP (last 3 results) No results for input(s): PROBNP in the last 8760 hours. HbA1C: No results for input(s): HGBA1C in the last 72 hours. CBG: No results for input(s): GLUCAP in the last 168  hours. Lipid Profile: No results for input(s): CHOL, HDL, LDLCALC, TRIG, CHOLHDL, LDLDIRECT in the last 72 hours. Thyroid Function Tests: No results for input(s): TSH, T4TOTAL, FREET4, T3FREE, THYROIDAB in the last 72 hours. Anemia Panel: No results for input(s): VITAMINB12, FOLATE, FERRITIN, TIBC, IRON, RETICCTPCT in the last 72 hours. Sepsis Labs: Recent Labs  Lab 05/11/19 1700 05/12/19 1040 05/13/19 0705  PROCALCITON  --  0.61 0.39  LATICACIDVEN 1.2  --   --     Recent Results (from the past 240 hour(s))  Blood Culture (routine x 2)     Status: Abnormal   Collection Time: 05/11/19  5:00 PM  Result Value Ref Range Status   Specimen Description SITE NOT SPECIFIED  Final   Special Requests   Final    BOTTLES DRAWN AEROBIC AND ANAEROBIC Blood Culture adequate volume   Culture  Setup Time   Final    GRAM POSITIVE COCCI AEROBIC BOTTLE ONLY CRITICAL RESULT CALLED TO, READ BACK BY AND VERIFIED WITH: Tillman Sers [facility] 05/12/19 1737 JDW    Culture (A)  Final    STREPTOCOCCUS PNEUMONIAE STAPHYLOCOCCUS SPECIES (COAGULASE NEGATIVE) THE SIGNIFICANCE OF ISOLATING THIS ORGANISM FROM A SINGLE SET OF BLOOD CULTURES  WHEN MULTIPLE SETS ARE DRAWN IS UNCERTAIN. PLEASE NOTIFY THE MICROBIOLOGY DEPARTMENT WITHIN ONE WEEK IF SPECIATION AND SENSITIVITIES ARE REQUIRED. Performed at [facility], [place] [address] [place], [place] [address]    Report Status 05/14/2019 FINAL  Final   Organism ID, Bacteria STREPTOCOCCUS PNEUMONIAE  Final      Susceptibility   Streptococcus pneumoniae - MIC*    ERYTHROMYCIN <=0.12 SENSITIVE Sensitive     LEVOFLOXACIN 1 SENSITIVE Sensitive     VANCOMYCIN <=0.12 SENSITIVE Sensitive     PENICILLIN (non-meningitis) <=0.06 SENSITIVE Sensitive     CEFTRIAXONE (non-meningitis) <=0.12 SENSITIVE Sensitive     * STREPTOCOCCUS PNEUMONIAE  Blood Culture (routine x 2)     Status: None (Preliminary result)   Collection Time: 05/11/19  5:00 PM  Result Value Ref Range Status    Specimen Description SITE NOT SPECIFIED  Final   Special Requests   Final    BOTTLES DRAWN AEROBIC AND ANAEROBIC Blood Culture adequate volume   Culture   Final    NO GROWTH 4 DAYS Performed at [facility], [address], [place], [place] [address]    Report Status PENDING  Incomplete  Blood Culture ID Panel (Reflexed)     Status: Abnormal   Collection Time: 05/11/19  5:00 PM  Result Value Ref Range Status   Enterococcus species NOT DETECTED NOT DETECTED Final   Listeria monocytogenes NOT DETECTED NOT DETECTED Final   Staphylococcus species DETECTED (A) NOT DETECTED Final    Comment: Methicillin (oxacillin) susceptible coagulase negative staphylococcus. Possible blood culture contaminant (unless isolated from more than one blood culture draw or clinical case suggests pathogenicity). No antibiotic treatment is indicated for blood  culture contaminants. CRITICAL RESULT CALLED TO, READ BACK BY AND VERIFIED WITH: F WILSON PHARMD 05/12/19 1737 JDW    Staphylococcus aureus (BCID) NOT DETECTED NOT DETECTED Final   Methicillin resistance NOT DETECTED NOT DETECTED Final   Streptococcus species DETECTED (A) NOT DETECTED Final    Comment: CRITICAL RESULT CALLED TO, READ BACK BY AND VERIFIED WITH: F WILSON PHARMD 05/12/19 1737 JDW    Streptococcus agalactiae NOT DETECTED NOT DETECTED Final   Streptococcus pneumoniae DETECTED (A) NOT DETECTED Final    Comment: CRITICAL RESULT CALLED TO, READ BACK BY AND VERIFIED WITH: Tillman Sers PHARMD 05/12/19 1737 JDW    Streptococcus pyogenes NOT DETECTED NOT DETECTED Final   Acinetobacter baumannii NOT DETECTED NOT DETECTED Final   Enterobacteriaceae species NOT DETECTED NOT DETECTED Final   Enterobacter cloacae complex NOT DETECTED NOT DETECTED Final   Escherichia coli NOT DETECTED NOT DETECTED Final   Klebsiella oxytoca NOT DETECTED NOT DETECTED Final   Klebsiella pneumoniae NOT DETECTED NOT DETECTED Final   Proteus species NOT DETECTED NOT DETECTED  Final   Serratia marcescens NOT DETECTED NOT DETECTED Final   Haemophilus influenzae NOT DETECTED NOT DETECTED Final   Neisseria meningitidis NOT DETECTED NOT DETECTED Final   Pseudomonas aeruginosa NOT DETECTED NOT DETECTED Final   Candida albicans NOT DETECTED NOT DETECTED Final   Candida glabrata NOT DETECTED NOT DETECTED Final   Candida krusei NOT DETECTED NOT DETECTED Final   Candida parapsilosis NOT DETECTED NOT DETECTED Final   Candida tropicalis NOT DETECTED NOT DETECTED Final    Comment: Performed at [facility], [place] [address], [place], [place] [address]  SARS Coronavirus 2 (CEPHEID- Performed in [facility] hospital lab), Hosp Order     Status: None   Collection Time: 05/11/19  5:02 PM  Result  Value Ref Range Status   SARS Coronavirus 2 NEGATIVE NEGATIVE Final    Comment: (NOTE) If result is NEGATIVE SARS-CoV-2 target nucleic acids are NOT DETECTED. The SARS-CoV-2 RNA is generally detectable in upper and lower  respiratory specimens during the acute phase of infection. The lowest  concentration of SARS-CoV-2 viral copies this assay can detect is 250  copies / mL. A negative result does not preclude SARS-CoV-2 infection  and should not be used as the sole basis for treatment or other  patient management decisions.  A negative result may occur with  improper specimen collection / handling, submission of specimen other  than nasopharyngeal swab, presence of viral mutation(s) within the  areas targeted by this assay, and inadequate number of viral copies  (<250 copies / mL). A negative result must be combined with clinical  observations, patient history, and epidemiological information. If result is POSITIVE SARS-CoV-2 target nucleic acids are DETECTED. The SARS-CoV-2 RNA is generally detectable in upper and lower  respiratory specimens dur ing the acute phase of infection.  Positive  results are indicative of active infection with SARS-CoV-2.  Clinical  correlation  with patient history and other diagnostic information is  necessary to determine patient infection status.  Positive results do  not rule out bacterial infection or co-infection with other viruses. If result is PRESUMPTIVE POSTIVE SARS-CoV-2 nucleic acids MAY BE PRESENT.   A presumptive positive result was obtained on the submitted specimen  and confirmed on repeat testing.  While 2019 novel coronavirus  (SARS-CoV-2) nucleic acids may be present in the submitted sample  additional confirmatory testing may be necessary for epidemiological  and / or clinical management purposes  to differentiate between  SARS-CoV-2 and other Sarbecovirus currently known to infect humans.  If clinically indicated additional testing with an alternate test  methodology [id number]) is advised. The SARS-CoV-2 RNA is generally  detectable in upper and lower respiratory sp ecimens during the acute  phase of infection. The expected result is Negative. Fact Sheet for Patients:  StrictlyIdeas.no Fact Sheet for Healthcare Providers: BankingDealers.co.za This test is not yet approved or cleared by the [place] FDA and has been authorized for detection and/or diagnosis of SARS-CoV-2 by FDA under an Emergency Use Authorization (EUA).  This EUA will remain in effect (meaning this test can be used) for the duration of the COVID-19 declaration under Section 564(b)(1) of the Act, 21 U.S.C. section 360bbb-3(b)(1), unless the authorization is terminated or revoked sooner. Performed at [facility], [place] [address], [place], [place] [address]   Culture, sputum-assessment     Status: None   Collection Time: 05/13/19  5:13 AM  Result Value Ref Range Status   Specimen Description SPUTUM  Final   Special Requests NONE  Final   Sputum evaluation   Final    THIS SPECIMEN IS ACCEPTABLE FOR SPUTUM CULTURE Performed at [facility], [address], [place], [place]  [address]    Report Status 05/13/2019 FINAL  Final  Culture, respiratory     Status: None (Preliminary result)   Collection Time: 05/13/19  5:13 AM  Result Value Ref Range Status   Specimen Description SPUTUM  Final   Special Requests NONE Reflexed from [id number]  Final   Gram Stain   Final    ABUNDANT WBC PRESENT, PREDOMINANTLY PMN MODERATE GRAM POSITIVE COCCI FEW GRAM POSITIVE RODS    Culture   Final    CULTURE REINCUBATED FOR BETTER GROWTH Performed at [facility],  [address] [place], [place] [address]    Report Status PENDING  Incomplete  Culture, blood (routine x 2)     Status: None (Preliminary result)   Collection Time: 05/13/19 10:53 AM  Result Value Ref Range Status   Specimen Description BLOOD RIGHT ANTECUBITAL  Final   Special Requests   Final    BOTTLES DRAWN AEROBIC AND ANAEROBIC Blood Culture adequate volume   Culture   Final    NO GROWTH 2 DAYS Performed at [facility], [place] [address], [place], [place] [address]    Report Status PENDING  Incomplete  Culture, blood (routine x 2)     Status: None (Preliminary result)   Collection Time: 05/13/19 10:53 AM  Result Value Ref Range Status   Specimen Description BLOOD LEFT ANTECUBITAL  Final   Special Requests   Final    BOTTLES DRAWN AEROBIC AND ANAEROBIC Blood Culture results may not be optimal due to an excessive volume of blood received in culture bottles   Culture   Final    NO GROWTH 2 DAYS Performed at [facility], [place] [address] [place], [place] [address]    Report Status PENDING  Incomplete         Radiology Studies: No results found.      Scheduled Meds: . budesonide (PULMICORT) nebulizer solution  0.5 mg Nebulization BID  . enoxaparin (LOVENOX) injection  40 mg Subcutaneous Q24H  . feeding supplement (ENSURE ENLIVE)  237 mL Oral BID BM  . ipratropium-albuterol  3 mL Nebulization TID  . lidocaine  15 mL Oral Once  . methylPREDNISolone (SOLU-MEDROL) injection  40 mg  Intravenous Q8H  . nicotine  21 mg Transdermal Daily  . sodium chloride flush  3 mL Intravenous Q12H   Continuous Infusions: . sodium chloride    . cefTRIAXone (ROCEPHIN)  IV 2 g (05/14/19 1750)     LOS: 3 days   Time spent= 30 mins    Bonnell Public, MD [facility]  If 7PM-7AM, please contact night-coverage [email or web address] 05/15/2019, 12:57 PM

## 2019-05-16 LAB — CBC WITH DIFFERENTIAL/PLATELET
Abs Immature Granulocytes: 0.14 10*3/uL — ABNORMAL HIGH (ref 0.00–0.07)
Basophils Absolute: 0 10*3/uL (ref 0.0–0.1)
Basophils Relative: 0 %
Eosinophils Absolute: 0 10*3/uL (ref 0.0–0.5)
Eosinophils Relative: 0 %
HCT: 38 % — ABNORMAL LOW (ref 39.0–52.0)
Hemoglobin: 12.4 g/dL — ABNORMAL LOW (ref 13.0–17.0)
Immature Granulocytes: 1 %
Lymphocytes Relative: 19 %
Lymphs Abs: 2.5 10*3/uL (ref 0.7–4.0)
MCH: 29.5 pg (ref 26.0–34.0)
MCHC: 32.6 g/dL (ref 30.0–36.0)
MCV: 90.5 fL (ref 80.0–100.0)
Monocytes Absolute: 0.7 10*3/uL (ref 0.1–1.0)
Monocytes Relative: 5 %
Neutro Abs: 9.9 10*3/uL — ABNORMAL HIGH (ref 1.7–7.7)
Neutrophils Relative %: 75 %
Platelets: 412 10*3/uL — ABNORMAL HIGH (ref 150–400)
RBC: 4.2 MIL/uL — ABNORMAL LOW (ref 4.22–5.81)
RDW: 14.3 % (ref 11.5–15.5)
WBC: 13.3 10*3/uL — ABNORMAL HIGH (ref 4.0–10.5)
nRBC: 0 % (ref 0.0–0.2)

## 2019-05-16 LAB — CULTURE, BLOOD (ROUTINE X 2)
Culture: NO GROWTH
Special Requests: ADEQUATE

## 2019-05-16 LAB — BASIC METABOLIC PANEL
Anion gap: 10 (ref 5–15)
BUN: 19 mg/dL (ref 6–20)
CO2: 26 mmol/L (ref 22–32)
Calcium: 9 mg/dL (ref 8.9–10.3)
Chloride: 100 mmol/L (ref 98–111)
Creatinine, Ser: 0.88 mg/dL (ref 0.61–1.24)
GFR calc Af Amer: >60 mL/min (ref >60)
GFR calc non Af Amer: >60 mL/min (ref >60)
Glucose, Bld: 101 mg/dL (ref 70–99)
Potassium: 5.7 mmol/L (ref 3.5–5.1)
Sodium: 136 mmol/L (ref 135–145)

## 2019-05-16 LAB — RENAL FUNCTION PANEL
Albumin: 2.9 g/dL — ABNORMAL LOW (ref 3.5–5.0)
Anion gap: 8 (ref 5–15)
BUN: 19 mg/dL (ref 6–20)
CO2: 28 mmol/L (ref 22–32)
Calcium: 8.9 mg/dL (ref 8.9–10.3)
Chloride: 99 mmol/L (ref 98–111)
Creatinine, Ser: 0.87 mg/dL (ref 0.61–1.24)
GFR calc Af Amer: >60 mL/min (ref >60)
GFR calc non Af Amer: >60 mL/min (ref >60)
Glucose, Bld: 103 mg/dL — ABNORMAL HIGH (ref 70–99)
Phosphorus: 4.1 mg/dL (ref 2.5–4.6)
Potassium: 5.7 mmol/L — ABNORMAL HIGH (ref 3.5–5.1)
Sodium: 135 mmol/L (ref 135–145)

## 2019-05-16 LAB — CULTURE, RESPIRATORY W GRAM STAIN: Culture: NORMAL

## 2019-05-16 LAB — CBC
HCT: 37.7 % — ABNORMAL LOW (ref 39.0–52.0)
Hemoglobin: 12.2 g/dL — ABNORMAL LOW (ref 13.0–17.0)
MCH: 29.2 pg (ref 26.0–34.0)
MCHC: 32.4 g/dL (ref 30.0–36.0)
MCV: 90.2 fL (ref 80.0–100.0)
Platelets: 402 10*3/uL — ABNORMAL HIGH (ref 150–400)
RBC: 4.18 MIL/uL — ABNORMAL LOW (ref 4.22–5.81)
RDW: 14.3 % (ref 11.5–15.5)
WBC: 13.5 10*3/uL — ABNORMAL HIGH (ref 4.0–10.5)
nRBC: 0 % (ref 0.0–0.2)

## 2019-05-16 LAB — MAGNESIUM: Magnesium: 2.5 mg/dL — ABNORMAL HIGH (ref 1.7–2.4)

## 2019-05-16 MED ORDER — SODIUM ZIRCONIUM CYCLOSILICATE 10 G PO PACK
10.0000 g | PACK | Freq: Once | ORAL | Status: AC
  Administered 2019-05-16: 10 g via ORAL
  Filled 2019-05-16: qty 1

## 2019-05-16 MED ORDER — IPRATROPIUM-ALBUTEROL 0.5-2.5 (3) MG/3ML IN SOLN
3.0000 mL | Freq: Two times a day (BID) | RESPIRATORY_TRACT | Status: DC
  Administered 2019-05-16 – 2019-05-17 (×2): 3 mL via RESPIRATORY_TRACT
  Filled 2019-05-16 (×2): qty 3

## 2019-05-16 NOTE — Progress Notes (Addendum)
PROGRESS NOTE    Randall Valencia  WNI:[id number] DOB: May 08, 1961 DOA: 05/11/2019 PCP: Patient, No Pcp Per   Brief Narrative:  58 year old with history of cerebral aneurysm, tobacco use came to the hospital with complains of generalized fever, aches and chills.  He reports of chronic cough due to smoking.  In the ER he was noted to be tachypneic with abnormal breath sounds.  Saturating well on room air.  Chest x-ray showed concerning for left basilar pneumonia.  Inflammatory markers slightly elevated.  CT of the head negative.  Also reported of back pain therefore CT lumbar spine which is negative.  Chemistry showed sodium 129, leukocytosis 16,000, elevated procalcitonin.  Started on broad-spectrum antibiotics and medical team was requested to admit the patient.  COVID was negative.  05/15/2019: Patient seen.  No new complaints.  Patient continues to improve slowly.  Patient is on IV Rocephin for strep pneumonia bacteremia/sepsis.  05/16/2019: Repeat cultures have not grown any organisms to date.  Continue antibiotics for now.  Likely discharge tomorrow on oral antibiotics.  Repeat CT chest as planned.  Assessment & Plan:   Principal Problem:   CAP (community acquired pneumonia) Active Problems:   Hyponatremia   Severe protein-calorie malnutrition (HCC)   Bronchospasm, acute   Tobacco use   Sepsis secondary to left lower lobe pneumonia, POA Hyponatremia secondary to mild to moderate dehydration, improved with hydration - Follow-up culture data.  Continue Roc D3, stop Azithro -If MRSA negative- discontinue vancomycin - BNP slightly elevated.  Procalcitonin trending down. -Bronchodilators scheduled and as necessary -Incentive spirometry and flutter valve. - Hyponatremia pretty much resolved with hydration - Urine strep-negative, COVID-negative -Legionella- negative -HIV-negative, UA-negative -D-dimer elevated, CTA chest is negative for PE but shows mucous plugging and reactive  lymphadenopathy.  Needs repeat CAT scan in about 3 months to ensure resolution of this. 05/15/2019: Continue IV Rocephin.  Slowly improving.  05/16/2019: Repeat cultures have not grown any organisms.  Likely DC tomorrow on oral antibiotics.  Streptococcus pneumonia bacteremia - No previous history of IV drug use according to him.  Initial blood cultures are positive for streptococcal bacteremia.  Surveillance cultures have been ordered on 6/4- denies currently pending. -Persistent leukocytosis, partly related to Solu-Medrol.  Remains afebrile we will continue current antibiotics. 05/15/2019: Complete course of antibiotics.   Bronchospasm secondary to community-acquired pneumonia, persist Active tobacco use, suspect underlying COPD - We will continue Solu-Medrol every 8 hours and bronchodilators. -Will need outpatient PFTs -Nicotine patch as necessary.  Counseled to quit smoking cigarettes 05/15/2019: Likely, patient has undiagnosed COPD with likely exacerbation.  Will await pulmonary team on discharge for formal testing.  Meanwhile, continue IV Solu-Medrol and nebulizer treatment for now.   05/16/2019: Proved respiratory symptoms.  Likely DC home tomorrow.   Severe protein calorie malnutrition -Nutrition team has been consulted.  Prealbumin less than 5  Hyperkalemia:  Lokelma to 10 g p.o. x1. Continue to monitor chemistry.  DVT prophylaxis: Lovenox Code Status: Full code Family Communication: None at bedside Disposition Plan: Need to ensure his bacteremia has been clearing up and he remained stable.  In the meantime we will have to ensure his breathing has stabilized as well.  Consultants:   None  Procedures:   None  Antimicrobials:   Rocephin day 3  Azithromycin day 2, stopped 6/4   Subjective: No new complaints. No fever or chills No shortness of breath  Objective: Vitals:   05/15/19 1648 05/15/19 1944 05/15/19 2325 05/16/19 0813  BP: 126/86  130/74 139/76  Pulse: 74   68 (!) 53  Resp: 19  18 18   Temp: 98.1 F (36.7 C)  98.2 F (36.8 C) 97.7 F (36.5 C)  TempSrc:   Oral Oral  SpO2: 99% 98% 96% 99%  Weight:      Height:        Intake/Output Summary (Last 24 hours) at 05/16/2019 0908 Last data filed at 05/16/2019 0153 Gross per 24 hour  Intake 480 ml  Output 750 ml  Net -270 ml   Filed Weights   05/11/19 1731 05/12/19 0057  Weight: 77.1 kg 72.6 kg    Examination: Constitutional: NAD, calm, comfortable Eyes: PERRL, lids and conjunctivae normal ENMT: Mucous membranes are moist. Posterior pharynx clear of any exudate or lesions.Normal dentition.  Neck: normal, supple, no masses, no thyromegaly Respiratory: Improved air entry.   Cardiovascular: S1-S2. Abdomen: no tenderness, no masses palpated. No hepatosplenomegaly. Bowel sounds positive.  Musculoskeletal: No leg edema. Neurologic: Awake and alert.  Patient moves all extremities.  Data Reviewed:   CBC: Recent Labs  Lab 05/11/19 1700 05/12/19 0527 05/13/19 0705 05/14/19 0602 05/15/19 0455 05/16/19 0731  WBC 16.5* 14.2* 14.0* 15.4* 12.3* 13.3*  13.5*  NEUTROABS 13.7* 11.0*  --   --   --  9.9*  HGB 11.5* 10.9* 10.7* 10.4* 10.2* 12.4*  12.2*  HCT 35.0* 33.2* 31.4* 32.4* 31.6* 38.0*  37.7*  MCV 90.0 90.0 88.7 90.3 90.5 90.5  90.2  PLT 274 205 271 290 322 412*  275*   Basic Metabolic Panel: Recent Labs  Lab 05/12/19 0527 05/13/19 0705 05/14/19 0602 05/15/19 0455 05/16/19 0731  NA 134* 133* 137 138 135  K 4.2 4.3 4.7 5.1 5.7*  CL 102 99 102 103 99  CO2 21* 24 26 27 28   GLUCOSE 88 158* 124* 169* 103*  BUN 15 14 17  22* 19  CREATININE 1.03 0.94 0.83 1.01 0.87  CALCIUM 7.9* 8.2* 8.4* 8.5* 8.9  MG  --  2.3 2.5* 2.5* 2.5*  PHOS  --   --   --   --  4.1   GFR: Estimated Creatinine Clearance: 93.9 mL/min (by C-G formula based on SCr of 0.87 mg/dL). Liver Function Tests: Recent Labs  Lab 05/11/19 1700 05/12/19 0527 05/16/19 0731  AST 21 18  --   ALT 17 12  --   ALKPHOS  69 61  --   BILITOT 1.3* 0.8  --   PROT 6.1* 5.0*  --   ALBUMIN 2.6* 2.2* 2.9*   Recent Labs  Lab 05/11/19 1700  LIPASE 30   No results for input(s): AMMONIA in the last 168 hours. Coagulation Profile: No results for input(s): INR, PROTIME in the last 168 hours. Cardiac Enzymes: No results for input(s): CKTOTAL, CKMB, CKMBINDEX, TROPONINI in the last 168 hours. BNP (last 3 results) No results for input(s): PROBNP in the last 8760 hours. HbA1C: No results for input(s): HGBA1C in the last 72 hours. CBG: No results for input(s): GLUCAP in the last 168 hours. Lipid Profile: No results for input(s): CHOL, HDL, LDLCALC, TRIG, CHOLHDL, LDLDIRECT in the last 72 hours. Thyroid Function Tests: No results for input(s): TSH, T4TOTAL, FREET4, T3FREE, THYROIDAB in the last 72 hours. Anemia Panel: No results for input(s): VITAMINB12, FOLATE, FERRITIN, TIBC, IRON, RETICCTPCT in the last 72 hours. Sepsis Labs: Recent Labs  Lab 05/11/19 1700 05/12/19 1040 05/13/19 0705  PROCALCITON  --  0.61 0.39  LATICACIDVEN 1.2  --   --     Recent Results (from the past  240 hour(s))  Blood Culture (routine x 2)     Status: Abnormal   Collection Time: 05/11/19  5:00 PM  Result Value Ref Range Status   Specimen Description SITE NOT SPECIFIED  Final   Special Requests   Final    BOTTLES DRAWN AEROBIC AND ANAEROBIC Blood Culture adequate volume   Culture  Setup Time   Final    GRAM POSITIVE COCCI AEROBIC BOTTLE ONLY CRITICAL RESULT CALLED TO, READ BACK BY AND VERIFIED WITH: F WILSON PHARMD 05/12/19 1737 JDW    Culture (A)  Final    STREPTOCOCCUS PNEUMONIAE STAPHYLOCOCCUS SPECIES (COAGULASE NEGATIVE) THE SIGNIFICANCE OF ISOLATING THIS ORGANISM FROM A SINGLE SET OF BLOOD CULTURES WHEN MULTIPLE SETS ARE DRAWN IS UNCERTAIN. PLEASE NOTIFY THE MICROBIOLOGY DEPARTMENT WITHIN ONE WEEK IF SPECIATION AND SENSITIVITIES ARE REQUIRED. Performed at [facility], [place] [address] [place], [place] [address]     Report Status 05/14/2019 FINAL  Final   Organism ID, Bacteria STREPTOCOCCUS PNEUMONIAE  Final      Susceptibility   Streptococcus pneumoniae - MIC*    ERYTHROMYCIN <=0.12 SENSITIVE Sensitive     LEVOFLOXACIN 1 SENSITIVE Sensitive     VANCOMYCIN <=0.12 SENSITIVE Sensitive     PENICILLIN (non-meningitis) <=0.06 SENSITIVE Sensitive     CEFTRIAXONE (non-meningitis) <=0.12 SENSITIVE Sensitive     * STREPTOCOCCUS PNEUMONIAE  Blood Culture (routine x 2)     Status: None   Collection Time: 05/11/19  5:00 PM  Result Value Ref Range Status   Specimen Description SITE NOT SPECIFIED  Final   Special Requests   Final    BOTTLES DRAWN AEROBIC AND ANAEROBIC Blood Culture adequate volume   Culture   Final    NO GROWTH 5 DAYS Performed at [facility], [address] [place], [place] [address]    Report Status 05/16/2019 FINAL  Final  Blood Culture ID Panel (Reflexed)     Status: Abnormal   Collection Time: 05/11/19  5:00 PM  Result Value Ref Range Status   Enterococcus species NOT DETECTED NOT DETECTED Final   Listeria monocytogenes NOT DETECTED NOT DETECTED Final   Staphylococcus species DETECTED (A) NOT DETECTED Final    Comment: Methicillin (oxacillin) susceptible coagulase negative staphylococcus. Possible blood culture contaminant (unless isolated from more than one blood culture draw or clinical case suggests pathogenicity). No antibiotic treatment is indicated for blood  culture contaminants. CRITICAL RESULT CALLED TO, READ BACK BY AND VERIFIED WITH: Tillman Sers [facility] 05/12/19 1737 JDW    Staphylococcus aureus (BCID) NOT DETECTED NOT DETECTED Final   Methicillin resistance NOT DETECTED NOT DETECTED Final   Streptococcus species DETECTED (A) NOT DETECTED Final    Comment: CRITICAL RESULT CALLED TO, READ BACK BY AND VERIFIED WITH: F WILSON PHARMD 05/12/19 1737 JDW    Streptococcus agalactiae NOT DETECTED NOT DETECTED Final   Streptococcus pneumoniae DETECTED (A) NOT DETECTED Final     Comment: CRITICAL RESULT CALLED TO, READ BACK BY AND VERIFIED WITH: Tillman Sers PHARMD 05/12/19 1737 JDW    Streptococcus pyogenes NOT DETECTED NOT DETECTED Final   Acinetobacter baumannii NOT DETECTED NOT DETECTED Final   Enterobacteriaceae species NOT DETECTED NOT DETECTED Final   Enterobacter cloacae complex NOT DETECTED NOT DETECTED Final   Escherichia coli NOT DETECTED NOT DETECTED Final   Klebsiella oxytoca NOT DETECTED NOT DETECTED Final   Klebsiella pneumoniae NOT DETECTED NOT DETECTED Final   Proteus species NOT DETECTED NOT DETECTED Final   Serratia marcescens NOT DETECTED NOT DETECTED Final  Haemophilus influenzae NOT DETECTED NOT DETECTED Final   Neisseria meningitidis NOT DETECTED NOT DETECTED Final   Pseudomonas aeruginosa NOT DETECTED NOT DETECTED Final   Candida albicans NOT DETECTED NOT DETECTED Final   Candida glabrata NOT DETECTED NOT DETECTED Final   Candida krusei NOT DETECTED NOT DETECTED Final   Candida parapsilosis NOT DETECTED NOT DETECTED Final   Candida tropicalis NOT DETECTED NOT DETECTED Final    Comment: Performed at [facility], [place] [address], [place], [place] [address]  SARS Coronavirus 2 (CEPHEID- Performed in [place] [facility]), Hosp Order     Status: None   Collection Time: 05/11/19  5:02 PM  Result Value Ref Range Status   SARS Coronavirus 2 NEGATIVE NEGATIVE Final    Comment: (NOTE) If result is NEGATIVE SARS-CoV-2 target nucleic acids are NOT DETECTED. The SARS-CoV-2 RNA is generally detectable in upper and lower  respiratory specimens during the acute phase of infection. The lowest  concentration of SARS-CoV-2 viral copies this assay can detect is 250  copies / mL. A negative result does not preclude SARS-CoV-2 infection  and should not be used as the sole basis for treatment or other  patient management decisions.  A negative result may occur with  improper specimen collection / handling, submission of specimen other  than  nasopharyngeal swab, presence of viral mutation(s) within the  areas targeted by this assay, and inadequate number of viral copies  (<250 copies / mL). A negative result must be combined with clinical  observations, patient history, and epidemiological information. If result is POSITIVE SARS-CoV-2 target nucleic acids are DETECTED. The SARS-CoV-2 RNA is generally detectable in upper and lower  respiratory specimens dur ing the acute phase of infection.  Positive  results are indicative of active infection with SARS-CoV-2.  Clinical  correlation with patient history and other diagnostic information is  necessary to determine patient infection status.  Positive results do  not rule out bacterial infection or co-infection with other viruses. If result is PRESUMPTIVE POSTIVE SARS-CoV-2 nucleic acids MAY BE PRESENT.   A presumptive positive result was obtained on the submitted specimen  and confirmed on repeat testing.  While 2019 novel coronavirus  (SARS-CoV-2) nucleic acids may be present in the submitted sample  additional confirmatory testing may be necessary for epidemiological  and / or clinical management purposes  to differentiate between  SARS-CoV-2 and other Sarbecovirus currently known to infect humans.  If clinically indicated additional testing with an alternate test  methodology [phone]) is advised. The SARS-CoV-2 RNA is generally  detectable in upper and lower respiratory sp ecimens during the acute  phase of infection. The expected result is Negative. Fact Sheet for Patients:  StrictlyIdeas.no Fact Sheet for Healthcare Providers: BankingDealers.co.za This test is not yet approved or cleared by the [place] FDA and has been authorized for detection and/or diagnosis of SARS-CoV-2 by FDA under an Emergency Use Authorization (EUA).  This EUA will remain in effect (meaning this test can be used) for the duration of the  COVID-19 declaration under Section 564(b)(1) of the Act, 21 U.S.C. section 360bbb-3(b)(1), unless the authorization is terminated or revoked sooner. Performed at [facility], [place] [address], [place], [place] [address]   Culture, sputum-assessment     Status: None   Collection Time: 05/13/19  5:13 AM  Result Value Ref Range Status   Specimen Description SPUTUM  Final   Special Requests NONE  Final   Sputum evaluation   Final  THIS SPECIMEN IS ACCEPTABLE FOR SPUTUM CULTURE Performed at [facility], [place] [address] [place], [place] [address]    Report Status 05/13/2019 FINAL  Final  Culture, respiratory     Status: None (Preliminary result)   Collection Time: 05/13/19  5:13 AM  Result Value Ref Range Status   Specimen Description SPUTUM  Final   Special Requests NONE Reflexed from [id number]  Final   Gram Stain   Final    ABUNDANT WBC PRESENT, PREDOMINANTLY PMN MODERATE GRAM POSITIVE COCCI FEW GRAM POSITIVE RODS    Culture   Final    CULTURE REINCUBATED FOR BETTER GROWTH Performed at [facility], [place] [address], [place], [place] [address]    Report Status PENDING  Incomplete  Culture, blood (routine x 2)     Status: None (Preliminary result)   Collection Time: 05/13/19 10:53 AM  Result Value Ref Range Status   Specimen Description BLOOD RIGHT ANTECUBITAL  Final   Special Requests   Final    BOTTLES DRAWN AEROBIC AND ANAEROBIC Blood Culture adequate volume   Culture   Final    NO GROWTH 3 DAYS Performed at [facility], [place] [address] [place], [place] [address]    Report Status PENDING  Incomplete  Culture, blood (routine x 2)     Status: None (Preliminary result)   Collection Time: 05/13/19 10:53 AM  Result Value Ref Range Status   Specimen Description BLOOD LEFT ANTECUBITAL  Final   Special Requests   Final    BOTTLES DRAWN AEROBIC AND ANAEROBIC Blood Culture results may not be optimal due to an excessive volume of blood received in culture  bottles   Culture   Final    NO GROWTH 3 DAYS Performed at [facility], [place] [address], [place], [place] [address]    Report Status PENDING  Incomplete         Radiology Studies: No results found.      Scheduled Meds: . budesonide (PULMICORT) nebulizer solution  0.5 mg Nebulization BID  . enoxaparin (LOVENOX) injection  40 mg Subcutaneous Q24H  . feeding supplement (ENSURE ENLIVE)  237 mL Oral BID BM  . ipratropium-albuterol  3 mL Nebulization TID  . lidocaine  15 mL Oral Once  . methylPREDNISolone (SOLU-MEDROL) injection  40 mg Intravenous Q8H  . nicotine  21 mg Transdermal Daily  . sodium chloride flush  3 mL Intravenous Q12H  . sodium zirconium cyclosilicate  10 g Oral Once   Continuous Infusions: . sodium chloride    . cefTRIAXone (ROCEPHIN)  IV 2 g (05/15/19 1723)     LOS: 4 days   Time spent= 25 mins    Bonnell Public, MD [facility]  If 7PM-7AM, please contact night-coverage [email or web address] 05/16/2019, 9:08 AM

## 2019-05-17 LAB — CBC
HCT: 37.7 % — ABNORMAL LOW (ref 39.0–52.0)
Hemoglobin: 12.5 g/dL — ABNORMAL LOW (ref 13.0–17.0)
MCH: 29.6 pg (ref 26.0–34.0)
MCHC: 33.2 g/dL (ref 30.0–36.0)
MCV: 89.3 fL (ref 80.0–100.0)
Platelets: 454 10*3/uL — ABNORMAL HIGH (ref 150–400)
RBC: 4.22 MIL/uL (ref 4.22–5.81)
RDW: 14.2 % (ref 11.5–15.5)
WBC: 14.9 10*3/uL — ABNORMAL HIGH (ref 4.0–10.5)
nRBC: 0 % (ref 0.0–0.2)

## 2019-05-17 LAB — CBC WITH DIFFERENTIAL/PLATELET
Abs Immature Granulocytes: 0.17 10*3/uL — ABNORMAL HIGH (ref 0.00–0.07)
Basophils Absolute: 0 10*3/uL (ref 0.0–0.1)
Basophils Relative: 0 %
Eosinophils Absolute: 0 10*3/uL (ref 0.0–0.5)
Eosinophils Relative: 0 %
HCT: 41 % (ref 39.0–52.0)
Hemoglobin: 13.3 g/dL (ref 13.0–17.0)
Immature Granulocytes: 1 %
Lymphocytes Relative: 12 %
Lymphs Abs: 1.8 10*3/uL (ref 0.7–4.0)
MCH: 29.2 pg (ref 26.0–34.0)
MCHC: 32.4 g/dL (ref 30.0–36.0)
MCV: 90.1 fL (ref 80.0–100.0)
Monocytes Absolute: 0.5 10*3/uL (ref 0.1–1.0)
Monocytes Relative: 3 %
Neutro Abs: 13.1 10*3/uL — ABNORMAL HIGH (ref 1.7–7.7)
Neutrophils Relative %: 84 %
Platelets: 502 10*3/uL — ABNORMAL HIGH (ref 150–400)
RBC: 4.55 MIL/uL (ref 4.22–5.81)
RDW: 14.3 % (ref 11.5–15.5)
WBC: 15.6 10*3/uL — ABNORMAL HIGH (ref 4.0–10.5)
nRBC: 0 % (ref 0.0–0.2)

## 2019-05-17 LAB — MAGNESIUM: Magnesium: 2.6 mg/dL — ABNORMAL HIGH (ref 1.7–2.4)

## 2019-05-17 LAB — RENAL FUNCTION PANEL
Albumin: 2.8 g/dL — ABNORMAL LOW (ref 3.5–5.0)
Anion gap: 10 (ref 5–15)
BUN: 19 mg/dL (ref 6–20)
CO2: 27 mmol/L (ref 22–32)
Calcium: 8.8 mg/dL — ABNORMAL LOW (ref 8.9–10.3)
Chloride: 97 mmol/L — ABNORMAL LOW (ref 98–111)
Creatinine, Ser: 0.9 mg/dL (ref 0.61–1.24)
GFR calc Af Amer: >60 mL/min (ref >60)
GFR calc non Af Amer: >60 mL/min (ref >60)
Glucose, Bld: 107 mg/dL — ABNORMAL HIGH (ref 70–99)
Phosphorus: 3.8 mg/dL (ref 2.5–4.6)
Potassium: 5.1 mmol/L (ref 3.5–5.1)
Sodium: 134 mmol/L — ABNORMAL LOW (ref 135–145)

## 2019-05-17 LAB — PROCALCITONIN: Procalcitonin: <0.1 ng/mL

## 2019-05-17 MED ORDER — BUDESONIDE-FORMOTEROL FUMARATE 80-4.5 MCG/ACT IN AERO: 2.0000 | INHALATION_SPRAY | Freq: Two times a day (BID) | RESPIRATORY_TRACT | 12 refills | Status: DC

## 2019-05-17 MED ORDER — TIOTROPIUM BROMIDE MONOHYDRATE 18 MCG IN CAPS: 18.0000 ug | ORAL_CAPSULE | Freq: Every day | RESPIRATORY_TRACT | 2 refills | Status: DC

## 2019-05-17 MED ORDER — ENSURE ENLIVE PO LIQD: 237.0000 mL | Freq: Two times a day (BID) | ORAL | 12 refills | Status: DC

## 2019-05-17 MED ORDER — IPRATROPIUM-ALBUTEROL 20-100 MCG/ACT IN AERS: 1.0000 | INHALATION_SPRAY | Freq: Four times a day (QID) | RESPIRATORY_TRACT | 0 refills | Status: DC | PRN

## 2019-05-17 MED ORDER — SODIUM ZIRCONIUM CYCLOSILICATE 5 G PO PACK
5.0000 g | PACK | Freq: Once | ORAL | Status: AC
  Administered 2019-05-17: 5 g via ORAL
  Filled 2019-05-17: qty 1

## 2019-05-17 MED ORDER — PREDNISONE 10 MG PO TABS: ORAL_TABLET | ORAL | 0 refills | Status: DC

## 2019-05-17 MED ORDER — AMOXICILLIN 500 MG PO TABS: 500.0000 mg | ORAL_TABLET | Freq: Two times a day (BID) | ORAL | 0 refills | Status: AC

## 2019-05-17 MED ORDER — NICOTINE 21 MG/24HR TD PT24: 21.0000 mg | MEDICATED_PATCH | Freq: Every day | TRANSDERMAL | 0 refills | Status: DC

## 2019-05-17 MED FILL — SYMBICORT 80-4.5 MCG INH: 80-4.5 | 30 days supply | Qty: 10 | Fill #0

## 2019-05-17 MED FILL — predniSONE 10 MG TABS: 10 | 15 days supply | Qty: 48 | Fill #0

## 2019-05-17 MED FILL — AMOXICILLIN 500 MG CAPS: 500 | 7 days supply | Qty: 14 | Fill #0

## 2019-05-17 MED FILL — SPIRIVA 18 MCG CP-HANDIHALE: 18 | 30 days supply | Qty: 30 | Fill #0

## 2019-05-17 MED FILL — COMBIVENT RESPIMAT INHAL SP: 20-100 | 30 days supply | Qty: 4 | Fill #0

## 2019-05-17 NOTE — TOC Initial Note (Signed)
Transition of Care [facility]) - Initial/Assessment Note    Patient Details  Name: Randall Valencia MRN: [id number] Date of Birth: 10-14-1961  Transition [facility] Care [facility]) CM/SW Contact:    Pollie Friar, RN Phone Number: 05/17/2019, 1:53 PM  Clinical Narrative:                   Expected Discharge Plan: Home/Self Care Barriers to Discharge: Inadequate or no insurance   Patient Goals and CMS Choice        Expected Discharge Plan and Services Expected Discharge Plan: Home/Self Care   Discharge Planning Services: CM Consult   Living arrangements for the past 2 months: Single Family Home Expected Discharge Date: 05/17/19                                    Prior Living Arrangements/Services Living arrangements for the past 2 months: Single Family Home Lives with:: Parents Patient language and need for interpreter reviewed:: Yes(no needs) Do you feel safe going back to the place where you live?: Yes          Current home services: DME(walker, shower chair, 3 in 1) Criminal Activity/Legal Involvement Pertinent to Current Situation/Hospitalization: No - Comment as needed  Activities of Daily Living Home Assistive Devices/Equipment: Cane (specify quad or straight), Crutches(straight cane) ADL Screening (condition at time of admission) Patient's cognitive ability adequate to safely complete daily activities?: Yes Is the patient deaf or have difficulty hearing?: No Does the patient have difficulty seeing, even when wearing glasses/contacts?: No Does the patient have difficulty concentrating, remembering, or making decisions?: No Patient able to express need for assistance with ADLs?: Yes Does the patient have difficulty dressing or bathing?: No Independently performs ADLs?: Yes (appropriate for developmental age) Does the patient have difficulty walking or climbing stairs?: Yes(typically no, but is currently in a lot of pain) Weakness of Legs: Both Weakness of Arms/Hands:  None  Permission Sought/Granted                  Emotional Assessment Appearance:: Appears stated age Attitude/Demeanor/Rapport: Engaged Affect (typically observed): Accepting, Appropriate Orientation: : Oriented to Self, Oriented to Place, Oriented to  Time, Oriented to Situation   Psych Involvement: No (comment)  Admission diagnosis:  Community acquired pneumonia of left lower lobe of lung ([place]) [J18.1] Patient Active Problem List   Diagnosis Date Noted  . Bronchospasm, acute 05/12/2019  . Tobacco use 05/12/2019  . CAP (community acquired pneumonia) 05/11/2019  . Hyponatremia 05/11/2019  . Severe protein-calorie malnutrition ([place]) 05/11/2019   PCP:  Patient, No Pcp Per Pharmacy:   [facility] [place], [place] [address] [place] [address] Phone: [phone] Fax: [phone]     Social Determinants of Health ([place]) Interventions    Readmission Risk Interventions No flowsheet data found.

## 2019-05-17 NOTE — Plan of Care (Signed)
  Problem: Health Behavior/Discharge Planning: Goal: Ability to manage health-related needs will improve Outcome: Progressing   Problem: Clinical Measurements: Goal: Respiratory complications will improve Outcome: Progressing   Problem: Nutrition: Goal: Adequate nutrition will be maintained Outcome: Progressing   Problem: Education: Goal: Knowledge of General Education information will improve Description Including pain rating scale, medication(s)/side effects and non-pharmacologic comfort measures Outcome: Progressing

## 2019-05-17 NOTE — TOC Transition Note (Signed)
Transition of Care [facility]) - CM/SW Discharge Note   Patient Details  Name: Randall Valencia MRN: [id number] Date of Birth: Dec 01, 1961  Transition [facility] Care [facility]) CM/SW Contact:  Pollie Friar, RN Phone Number: 05/17/2019, 1:53 PM   Clinical Narrative:    Pt discharging home with self care. Pt is without insurance. CM spoke with him and he would like an appt at one of the [facility]. Appt obtained and placed on the AVS.  Pt has transportation home. CM provided a MATCH for his d/c medications. Meds sent to [place] who will deliver them to the room.   Final next level of care: Home/Self Care Barriers to Discharge: Inadequate or no insurance   Patient Goals and CMS Choice        Discharge Placement                       Discharge Plan and Services   Discharge Planning Services: CM Consult                                 Social Determinants of Health (SDOH) Interventions     Readmission Risk Interventions No flowsheet data found.

## 2019-05-17 NOTE — Discharge Summary (Signed)
Physician Discharge Summary  Patient ID: Randall Valencia MRN: [id number] DOB/AGE: 60-28-1962 59 y.o.  Admit date: 05/11/2019 Discharge date: 05/17/2019  Admission Diagnoses:  Discharge Diagnoses:  Principal Problem:   CAP (community acquired pneumonia) Active Problems:   Hyponatremia   Severe protein-calorie malnutrition ([place])   Bronchospasm, acute   Tobacco use   Discharged Condition: stable  Hospital Course: 58 year old with history of cerebral aneurysm, tobacco use came to the hospital with complains of generalized fever, aches and chills.  He reports of chronic cough due to smoking.  In the ER he was noted to be tachypneic with abnormal breath sounds.  Saturating well on room air.  Chest x-ray showed concerning for left basilar pneumonia.  Inflammatory markers slightly elevated.  CT of the head negative.  Also reported of back pain therefore CT lumbar spine which is negative.  Chemistry showed sodium 129, leukocytosis 16,000, elevated procalcitonin.  Started on broad-spectrum antibiotics and medical team was requested to admit the patient.  COVID was negative.  05/15/2019: Patient seen.  No new complaints.  Patient continues to improve slowly.  Patient is on IV Rocephin for strep pneumonia bacteremia/sepsis.  05/16/2019: Repeat cultures have not grown any organisms to date.  Continue antibiotics for now.  Likely discharge tomorrow on oral antibiotics.  Repeat CT chest as planned.  Sepsis secondary to left lower lobe pneumonia, POA Hyponatremia secondary to mild to moderate dehydration, improved with hydration - Follow-up culture data.  Continue Roc D3, stop Azithro -If MRSA negative- discontinue vancomycin - BNP slightly elevated.  Procalcitonin trending down. -Bronchodilators scheduled and as necessary -Incentive spirometry and flutter valve. - Hyponatremia pretty much resolved with hydration - Urine strep-negative, COVID-negative -Legionella- negative -HIV-negative,  UA-negative -D-dimer elevated, CTA chest is negative for PE but shows mucous plugging and reactive lymphadenopathy.  Needs repeat CAT scan in about 3 months to ensure resolution of this. 05/15/2019: Continue IV Rocephin.  Slowly improving.  05/16/2019: Repeat cultures have not grown any organisms.  Likely DC tomorrow on oral antibiotics.  Streptococcus pneumonia bacteremia - No previous history of IV drug use according to him.  Initial blood cultures are positive for streptococcal bacteremia.  Surveillance cultures have been ordered on 6/4- denies currently pending. -Persistent leukocytosis, partly related to Solu-Medrol.  Remains afebrile we will continue current antibiotics. 05/15/2019: Complete course of antibiotics.   Bronchospasm secondary to community-acquired pneumonia, persist Active tobacco use, suspect underlying COPD - We will continue Solu-Medrol every 8 hours and bronchodilators. -Will need outpatient PFTs -Nicotine patch as necessary.  Counseled to quit smoking cigarettes 05/15/2019: Likely, patient has undiagnosed COPD with likely exacerbation.  Will await pulmonary team on discharge for formal testing.  Meanwhile, continue IV Solu-Medrol and nebulizer treatment for now.   05/16/2019: Proved respiratory symptoms.  Likely DC home tomorrow.   Severe protein calorie malnutrition -Nutrition team has been consulted.  Prealbumin less than 5  Hyperkalemia:  Lokelma to 10 g p.o. x1. Continue to monitor chemistry.   Consults: None  Significant Diagnostic Studies:   Treatments:   Discharge Exam: Blood pressure 125/66, pulse (!) 56, temperature (!) 97.5 F (36.4 C), temperature source Oral, resp. rate 17, height 6' 2.02" (1.88 m), weight 72.6 kg, SpO2 98 %.   Disposition: Discharge disposition: 01-Home or Self Care   Discharge Instructions    Diet - low sodium heart healthy   Complete by:  As directed    Increase activity slowly   Complete by:  As directed       Allergies as  of 05/17/2019   No Known Allergies     Medication List    STOP taking these medications   ibuprofen 200 MG tablet Commonly known as:  ADVIL     TAKE these medications   amoxicillin 500 MG tablet Commonly known as:  AMOXIL Take 1 tablet (500 mg total) by mouth 2 (two) times daily for 7 days.   budesonide-formoterol 80-4.5 MCG/ACT inhaler Commonly known as:  Symbicort Inhale 2 puffs into the lungs 2 (two) times daily.   feeding supplement (ENSURE ENLIVE) Liqd Take 237 mLs by mouth 2 (two) times daily between meals.   Ipratropium-Albuterol 20-100 MCG/ACT Aers respimat Commonly known as:  Combivent Respimat Inhale 1 puff into the lungs every 6 (six) hours as needed for up to 30 days for wheezing.   nicotine 21 mg/24hr patch Commonly known as:  NICODERM CQ - dosed in mg/24 hours Place 1 patch (21 mg total) onto the skin daily. Start taking on:  May 18, 2019   predniSONE 10 MG tablet Commonly known as:  DELTASONE Prednisone 60 Mg po once daily for 3 days, then 40 Mg po once daily for 3 days, than 30 Mg po once daily for 3 days, then 20 Mg po once daily for 3 days and then 10 Mg po once daily for 3 days and stop   tiotropium 18 MCG inhalation capsule Commonly known as:  Spiriva HandiHaler Place 1 capsule (18 mcg total) into inhaler and inhale daily.        SignedBonnell Public 05/17/2019, 1:34 PM

## 2019-05-18 DIAGNOSIS — R7881 Bacteremia: Secondary | ICD-10-CM

## 2019-05-18 LAB — CULTURE, BLOOD (ROUTINE X 2)
Culture: NO GROWTH
Culture: NO GROWTH
Special Requests: ADEQUATE

## 2019-05-25 ENCOUNTER — Ambulatory Visit: Payer: Self-pay | Admitting: Family Medicine

## 2019-05-25 ENCOUNTER — Encounter: Payer: Self-pay | Admitting: Family Medicine

## 2019-06-02 ENCOUNTER — Ambulatory Visit: Payer: Self-pay | Admitting: Family Medicine

## 2019-06-16 ENCOUNTER — Ambulatory Visit: Payer: Self-pay | Admitting: Family Medicine

## 2020-10-18 DIAGNOSIS — R0789 Other chest pain: Secondary | ICD-10-CM

## 2021-01-09 DIAGNOSIS — R918 Other nonspecific abnormal finding of lung field: Secondary | ICD-10-CM

## 2021-01-19 ENCOUNTER — Telehealth: Payer: Self-pay | Admitting: Oncology

## 2021-01-19 NOTE — Telephone Encounter (Signed)
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  Appt made for 01/30/21 Labs 10:30 am - Consult 11:00 am  Patient scheduled w/Dr Orlene Erm 2/16

## 2021-01-29 NOTE — Progress Notes (Incomplete)
[facility]  [address] [place],  [place]  [address] [phone]  Clinic Day:  01/29/2021  Referring physician: No ref. provider found   HISTORY OF PRESENT ILLNESS:  The patient is a 60 y.o. male  *** who I was asked to consult upon for ***   PAST MEDICAL HISTORY:   Past Medical History:  Diagnosis Date  . Cerebral aneurysm     PAST SURGICAL HISTORY:  No past surgical history on file.  CURRENT MEDICATIONS:   Current Outpatient Medications  Medication Sig Dispense Refill  . budesonide-formoterol (SYMBICORT) 80-4.5 MCG/ACT inhaler Inhale 2 puffs into the lungs 2 (two) times daily. 1 Inhaler 12  . feeding supplement, ENSURE ENLIVE, (ENSURE ENLIVE) LIQD Take 237 mLs by mouth 2 (two) times daily between meals. 237 mL 12  . Ipratropium-Albuterol (COMBIVENT RESPIMAT) 20-100 MCG/ACT AERS respimat Inhale 1 puff into the lungs every 6 (six) hours as needed for up to 30 days for wheezing. 1 Inhaler 0  . nicotine (NICODERM CQ - DOSED IN MG/24 HOURS) 21 mg/24hr patch Place 1 patch (21 mg total) onto the skin daily. 28 patch 0  . predniSONE (DELTASONE) 10 MG tablet Prednisone 60 Mg po once daily for 3 days, then 40 Mg po once daily for 3 days, than 30 Mg po once daily for 3 days, then 20 Mg po once daily for 3 days and then 10 Mg po once daily for 3 days and stop 48 tablet 0  . tiotropium (SPIRIVA HANDIHALER) 18 MCG inhalation capsule Place 1 capsule (18 mcg total) into inhaler and inhale daily. 30 capsule 2   No current facility-administered medications for this visit.    ALLERGIES:  No Known Allergies  FAMILY HISTORY:  No family history on file.  SOCIAL HISTORY:   reports that he has been smoking. He has never used smokeless tobacco. He reports previous alcohol use. He reports previous drug use.  REVIEW OF SYSTEMS:  Review of Systems - Oncology   PHYSICAL EXAM:  There were no vitals taken for this visit. Wt Readings from Last 3 Encounters:   05/12/19 160 lb 0.9 oz (72.6 kg)   There is no height or weight on file to calculate BMI. Performance status (ECOG): {CHL ONC [id number] Physical Exam .phy  LABS:   CBC Latest Ref Rng & Units 05/17/2019 05/17/2019 05/16/2019  WBC 4.0 - 10.5 K/uL 15.6(H) 14.9(H) 13.3(H)  Hemoglobin 13.0 - 17.0 g/dL 13.3 12.5(L) 12.4(L)  Hematocrit 39.0 - 52.0 % 41.0 37.7(L) 38.0(L)  Platelets 150 - 400 K/uL 502(H) 454(H) 412(H)   CMP Latest Ref Rng & Units 05/17/2019 05/16/2019 05/16/2019  Glucose 70 - 99 mg/dL 107(H) 101 103(H)  BUN 6 - 20 mg/dL 19 19 19   Creatinine 0.61 - 1.24 mg/dL 0.90 0.88 0.87  Sodium 135 - 145 mmol/L 134(L) 136 135  Potassium 3.5 - 5.1 mmol/L 5.1 5.7 5.7(H)  Chloride 98 - 111 mmol/L 97(L) 100 99  CO2 22 - 32 mmol/L 27 26 28   Calcium 8.9 - 10.3 mg/dL 8.8(L) 9.0 8.9  Total Protein 6.5 - 8.1 g/dL - - -  Total Bilirubin 0.3 - 1.2 mg/dL - - -  Alkaline Phos 38 - 126 U/L - - -  AST 15 - 41 U/L - - -  ALT 0 - 44 U/L - - -     No results found for: CEA1 / No results found for: CEA1 No results found for: PSA1 No results found for: QAS341 No results found  for: AST419  No results found for: TOTALPROTELP, ALBUMINELP, A1GS, A2GS, BETS, BETA2SER, GAMS, MSPIKE, SPEI Lab Results  Component Value Date   FERRITIN 24 (H) 05/12/2019   Lab Results  Component Value Date   LDH 109 05/12/2019       Component Value Date/Time   LDH 109 05/12/2019 1040    Recent Review Flowsheet Data    Oncology Labs Latest Ref Rng & Units 05/12/2019   FERRITIN 24 - 336 ng/mL 508(H)   LDH 98 - 192 U/L 109      STUDIES:  No results found.   ASSESSMENT & PLAN:  A 60 y.o. male who I was asked to consult upon for *** .The patient understands all the plans discussed today and is in agreement with them.  I do appreciate No ref. provider found for his new consult.   Maryrose Colvin Macarthur Critchley, MD

## 2021-01-30 ENCOUNTER — Inpatient Hospital Stay: Payer: Medicaid Other | Attending: Oncology

## 2021-01-30 ENCOUNTER — Other Ambulatory Visit: Payer: Self-pay | Admitting: Oncology

## 2021-01-30 ENCOUNTER — Inpatient Hospital Stay: Payer: Medicaid Other | Admitting: Oncology

## 2021-01-30 ENCOUNTER — Telehealth: Payer: Self-pay | Admitting: Oncology

## 2021-01-30 DIAGNOSIS — C3432 Malignant neoplasm of lower lobe, left bronchus or lung: Secondary | ICD-10-CM

## 2021-01-30 NOTE — Telephone Encounter (Signed)
Patient researching other natural treatments before coming here.  Informed patient if he chooses to come here, just give [place] a call

## 2021-05-17 ENCOUNTER — Telehealth: Payer: Self-pay | Admitting: Hematology and Oncology

## 2021-05-17 NOTE — Telephone Encounter (Signed)
Hi Dana/Beth,  Can you help navigate this patient? Maybe he can see Dr. Julien Nordmann directly in [place]?

## 2021-05-17 NOTE — Telephone Encounter (Signed)
Patient referred by Dr Gardiner Rhyme for Lung CA.    Appt made 05/23/21 Consult 10:00 am  Patient requested to be referred to [facility]. Informed patient he could see Dr Alvy Bimler at our [facility] and he could stay w/Dr Alvy Bimler at [facility]  He has a [employer] but lives closer to [employer]

## 2021-05-18 ENCOUNTER — Telehealth: Payer: Self-pay | Admitting: Internal Medicine

## 2021-05-18 NOTE — Telephone Encounter (Signed)
Scheduled appt per 6/10 referral. Pt aware.

## 2021-05-18 NOTE — Telephone Encounter (Signed)
Scheduled appt per inbasket msg from [facility], no answer. No VM was set up to leave a msg. Mailed updated calendar to pt.

## 2021-05-21 ENCOUNTER — Telehealth: Payer: Self-pay | Admitting: Hematology and Oncology

## 2021-05-21 NOTE — Telephone Encounter (Signed)
Patient called to verify location for 6/15 New Patient Appt w/Dr Alvy Bimler.  [employer] System stated it was at [place] but actually it's the [employer]

## 2021-05-22 ENCOUNTER — Telehealth: Payer: Self-pay | Admitting: Hematology and Oncology

## 2021-05-22 NOTE — Telephone Encounter (Signed)
Finally reached patient to let him know his Appt w/Dr Alvy Bimler for 6/15 was Canceled

## 2021-05-23 ENCOUNTER — Inpatient Hospital Stay: Payer: Medicaid Other | Admitting: Hematology and Oncology

## 2021-05-24 ENCOUNTER — Emergency Department (HOSPITAL_COMMUNITY)
Admission: EM | Admit: 2021-05-24 | Discharge: 2021-05-25 | Disposition: A | Discharge status: Home / Self Care | Payer: Medicaid Other | Attending: Emergency Medicine | Admitting: Emergency Medicine

## 2021-05-24 DIAGNOSIS — R042 Hemoptysis: Secondary | ICD-10-CM | POA: Diagnosis not present

## 2021-05-24 DIAGNOSIS — R059 Cough, unspecified: Secondary | ICD-10-CM | POA: Diagnosis not present

## 2021-05-24 DIAGNOSIS — K279 Peptic ulcer, site unspecified, unspecified as acute or chronic, without hemorrhage or perforation: Secondary | ICD-10-CM | POA: Insufficient documentation

## 2021-05-24 DIAGNOSIS — F17200 Nicotine dependence, unspecified, uncomplicated: Secondary | ICD-10-CM | POA: Diagnosis not present

## 2021-05-24 DIAGNOSIS — R1013 Epigastric pain: Secondary | ICD-10-CM | POA: Diagnosis present

## 2021-05-24 LAB — COMPREHENSIVE METABOLIC PANEL
ALT: 11 U/L (ref 0–44)
AST: 19 U/L (ref 15–41)
Albumin: 3.4 g/dL — ABNORMAL LOW (ref 3.5–5.0)
Alkaline Phosphatase: 46 U/L (ref 38–126)
Anion gap: 5 (ref 5–15)
BUN: 28 mg/dL — ABNORMAL HIGH (ref 8–23)
CO2: 28 mmol/L (ref 22–32)
Calcium: 8.7 mg/dL — ABNORMAL LOW (ref 8.9–10.3)
Chloride: 106 mmol/L (ref 98–111)
Creatinine, Ser: 1.15 mg/dL (ref 0.61–1.24)
GFR, Estimated: >60 mL/min (ref >60)
Glucose, Bld: 110 mg/dL — ABNORMAL HIGH (ref 70–99)
Potassium: 4 mmol/L (ref 3.5–5.1)
Sodium: 139 mmol/L (ref 135–145)
Total Bilirubin: 0.6 mg/dL (ref 0.3–1.2)
Total Protein: 5.8 g/dL — ABNORMAL LOW (ref 6.5–8.1)

## 2021-05-24 LAB — LIPASE, BLOOD: Lipase: 142 U/L — ABNORMAL HIGH (ref 11–51)

## 2021-05-24 LAB — URINALYSIS, ROUTINE W REFLEX MICROSCOPIC
Bilirubin Urine: NEGATIVE
Glucose, UA: 50 mg/dL — AB
Hgb urine dipstick: NEGATIVE
Ketones, ur: NEGATIVE mg/dL
Leukocytes,Ua: NEGATIVE
Nitrite: NEGATIVE
Protein, ur: NEGATIVE mg/dL
Specific Gravity, Urine: 1.029 (ref 1.005–1.030)
pH: 5 (ref 5.0–8.0)

## 2021-05-24 LAB — CBC
HCT: 38.8 % — ABNORMAL LOW (ref 39.0–52.0)
Hemoglobin: 12.8 g/dL — ABNORMAL LOW (ref 13.0–17.0)
MCH: 30.6 pg (ref 26.0–34.0)
MCHC: 33 g/dL (ref 30.0–36.0)
MCV: 92.8 fL (ref 80.0–100.0)
Platelets: 228 10*3/uL (ref 150–400)
RBC: 4.18 MIL/uL — ABNORMAL LOW (ref 4.22–5.81)
RDW: 13.7 % (ref 11.5–15.5)
WBC: 10.7 10*3/uL — ABNORMAL HIGH (ref 4.0–10.5)
nRBC: 0 % (ref 0.0–0.2)

## 2021-05-24 NOTE — ED Triage Notes (Signed)
Pt c/o epigastric pain x1wk, milk makes it better. Recently diagnosed w lung cancer. Ambulatory, most comfortable sitting/hunched up. "12/10" pain 140 palp pressure 80 HR

## 2021-05-25 ENCOUNTER — Emergency Department (HOSPITAL_COMMUNITY): Payer: Medicaid Other

## 2021-05-25 ENCOUNTER — Other Ambulatory Visit: Payer: Self-pay

## 2021-05-25 MED ORDER — ALUM & MAG HYDROXIDE-SIMETH 200-200-20 MG/5ML PO SUSP
30.0000 mL | Freq: Once | ORAL | Status: AC
  Administered 2021-05-25: 30 mL via ORAL
  Filled 2021-05-25: qty 30

## 2021-05-25 MED ORDER — ONDANSETRON HCL 4 MG/2ML IJ SOLN
4.0000 mg | Freq: Once | INTRAMUSCULAR | Status: DC
  Filled 2021-05-25: qty 2

## 2021-05-25 MED ORDER — LIDOCAINE VISCOUS HCL 2 % MT SOLN
15.0000 mL | Freq: Once | OROMUCOSAL | Status: AC
  Administered 2021-05-25: 15 mL via ORAL
  Filled 2021-05-25: qty 15

## 2021-05-25 MED ORDER — MORPHINE SULFATE (PF) 4 MG/ML IV SOLN
4.0000 mg | Freq: Once | INTRAVENOUS | Status: AC
  Administered 2021-05-25: 4 mg via INTRAVENOUS
  Filled 2021-05-25: qty 1

## 2021-05-25 MED ORDER — IOHEXOL 300 MG/ML  SOLN
80.0000 mL | Freq: Once | INTRAMUSCULAR | Status: AC | PRN
  Administered 2021-05-25: 80 mL via INTRAVENOUS

## 2021-05-25 MED ORDER — OMEPRAZOLE 20 MG PO CPDR: 20.0000 mg | DELAYED_RELEASE_CAPSULE | Freq: Every day | ORAL | 1 refills | Status: DC

## 2021-05-25 MED ORDER — LACTATED RINGERS IV BOLUS
1000.0000 mL | Freq: Once | INTRAVENOUS | Status: AC
  Administered 2021-05-25: 1000 mL via INTRAVENOUS

## 2021-05-25 MED ORDER — SUCRALFATE 1 G PO TABS: 1.0000 g | ORAL_TABLET | Freq: Three times a day (TID) | ORAL | 0 refills | Status: DC

## 2021-05-25 MED ORDER — PANTOPRAZOLE SODIUM 40 MG IV SOLR
40.0000 mg | Freq: Once | INTRAVENOUS | Status: AC
  Administered 2021-05-25: 40 mg via INTRAVENOUS
  Filled 2021-05-25: qty 40

## 2021-05-25 NOTE — Discharge Instructions (Addendum)
The CAT scan today looked okay.  Concerned that you may be developing ulcers from having to take the prednisone regularly.  You were given a prescription for an antacid that should kick in within the next 4 to 7 days but also a tablet you can take 30 minutes before eating and before you go to bed to help with the pain.  Try to avoid spicy meals, heavy meals or large quantity of meals and eat small frequent portions.  If you start having vomiting and cannot hold anything down, noticed any blood in your vomit or black stools or are passing out you need to return to the emergency room immediately.

## 2021-05-25 NOTE — ED Provider Notes (Signed)
[facility] EMERGENCY DEPARTMENT Provider Note   CSN: [id number] Arrival date & time: 05/24/21  2123     History Chief Complaint  Patient presents with   Abdominal Pain    Randall Valencia is a 60 y.o. male.  The history is provided by the patient.  Abdominal Pain Pain location:  Epigastric and LUQ Pain quality: gnawing, sharp, shooting and stabbing   Pain radiates to:  Does not radiate Pain severity:  Severe Onset quality:  Gradual Duration:  10 days Timing:  Constant Progression:  Worsening Chronicity:  New Context: not alcohol use, not diet changes, not previous surgeries, not recent illness, not retching and not suspicious food intake   Relieved by: Only thing that makes it better for approximately 10 minutes is drinking milk. Worsened by:  Eating Ineffective treatments:  None tried Associated symptoms: anorexia and cough   Associated symptoms: no chest pain, no diarrhea, no fever, no hematuria, no nausea, no shortness of breath and no vomiting   Associated symptoms comment:  Chronic cough with intermittent hemoptysis but nothing new.  Patient is aware that he has small cell lung cancer and he is currently waiting to follow-up with oncology this month to discuss options.  He has not had any black stools or change in his stool.  He uses inhalers but has also been on prednisone for the last 1 month.  He denies any urinary complaints.  He does not drink alcohol and has never had abdominal pain like this.  No prior history of ulcers. Risk factors comment:  Cerebral aneurysm, small cell lung cancer currently waiting for evaluation for treatment, COPD     Past Medical History:  Diagnosis Date   Cerebral aneurysm     Patient Active Problem List   Diagnosis Date Noted   Bacteremia due to Streptococcus pneumoniae    Bronchospasm, acute 05/12/2019   Tobacco use 05/12/2019   CAP (community acquired pneumonia) 05/11/2019   Hyponatremia 05/11/2019   Severe  protein-calorie malnutrition ([place]) 05/11/2019    No past surgical history on file.     No family history on file.  Social History   Tobacco Use   Smoking status: Every Day    Pack years: 0.00   Smokeless tobacco: Never  Vaping Use   Vaping Use: Every day  Substance Use Topics   Alcohol use: Not Currently   Drug use: Not Currently    Home Medications Prior to Admission medications   Medication Sig Start Date End Date Taking? Authorizing Provider  budesonide-formoterol (SYMBICORT) 80-4.5 MCG/ACT inhaler Inhale 2 puffs into the lungs 2 (two) times daily. 05/17/19   Bonnell Public, MD  feeding supplement, ENSURE ENLIVE, (ENSURE ENLIVE) LIQD Take 237 mLs by mouth 2 (two) times daily between meals. 05/17/19   Bonnell Public, MD  Ipratropium-Albuterol (COMBIVENT RESPIMAT) 20-100 MCG/ACT AERS respimat Inhale 1 puff into the lungs every 6 (six) hours as needed for up to 30 days for wheezing. 05/17/19 06/16/19  Dana Allan I, MD  nicotine (NICODERM CQ - DOSED IN MG/24 HOURS) 21 mg/24hr patch Place 1 patch (21 mg total) onto the skin daily. 05/18/19   Bonnell Public, MD  predniSONE (DELTASONE) 10 MG tablet Prednisone 60 Mg po once daily for 3 days, then 40 Mg po once daily for 3 days, than 30 Mg po once daily for 3 days, then 20 Mg po once daily for 3 days and then 10 Mg po once daily for 3 days and stop 05/17/19  Bonnell Public, MD  tiotropium (SPIRIVA HANDIHALER) 18 MCG inhalation capsule Place 1 capsule (18 mcg total) into inhaler and inhale daily. 05/17/19 05/16/20  Bonnell Public, MD    Allergies    Patient has no known allergies.  Review of Systems   Review of Systems  Constitutional:  Negative for fever.  Respiratory:  Positive for cough. Negative for shortness of breath.   Cardiovascular:  Negative for chest pain.  Gastrointestinal:  Positive for abdominal pain and anorexia. Negative for diarrhea, nausea and vomiting.  Genitourinary:  Negative for hematuria.   All other systems reviewed and are negative.  Physical Exam Updated Vital Signs BP (!) 143/87 (BP Location: Right Arm)   Pulse 61   Temp 98.4 F (36.9 C)   Resp 17   SpO2 100%   Physical Exam Vitals and nursing note reviewed.  Constitutional:      General: He is not in acute distress.    Appearance: He is well-developed and underweight.  HENT:     Head: Normocephalic and atraumatic.  Eyes:     Conjunctiva/sclera: Conjunctivae normal.     Pupils: Pupils are equal, round, and reactive to light.  Cardiovascular:     Rate and Rhythm: Normal rate and regular rhythm.     Heart sounds: No murmur heard. Pulmonary:     Effort: Pulmonary effort is normal. No respiratory distress.     Breath sounds: Decreased air movement present. Examination of the right-lower field reveals decreased breath sounds. Examination of the left-lower field reveals decreased breath sounds. Decreased breath sounds present. No wheezing or rales.  Abdominal:     General: There is no distension.     Palpations: Abdomen is soft.     Tenderness: There is abdominal tenderness in the epigastric area and left upper quadrant. There is guarding. There is no rebound.  Musculoskeletal:        General: No tenderness. Normal range of motion.     Cervical back: Normal range of motion and neck supple.     Right lower leg: No edema.     Left lower leg: No edema.  Skin:    General: Skin is warm and dry.     Findings: No erythema or rash.  Neurological:     Mental Status: He is alert and oriented to person, place, and time. Mental status is at baseline.  Psychiatric:        Mood and Affect: Mood normal.        Behavior: Behavior normal.    ED Results / Procedures / Treatments   Labs (all labs ordered are listed, but only abnormal results are displayed) Labs Reviewed  LIPASE, BLOOD - Abnormal; Notable for the following components:      Result Value   Lipase 142 (*)    All other components within normal limits   COMPREHENSIVE METABOLIC PANEL - Abnormal; Notable for the following components:   Glucose, Bld 110 (*)    BUN 28 (*)    Calcium 8.7 (*)    Total Protein 5.8 (*)    Albumin 3.4 (*)    All other components within normal limits  CBC - Abnormal; Notable for the following components:   WBC 10.7 (*)    RBC 4.18 (*)    Hemoglobin 12.8 (*)    HCT 38.8 (*)    All other components within normal limits  URINALYSIS, ROUTINE W REFLEX MICROSCOPIC - Abnormal; Notable for the following components:   Glucose, UA 50 (*)  All other components within normal limits    EKG None  Radiology CT ABDOMEN PELVIS W CONTRAST  Result Date: 05/25/2021 CLINICAL DATA:  Small-cell lung cancer.  Epigastric pain. EXAM: CT ABDOMEN AND PELVIS WITH CONTRAST TECHNIQUE: Multidetector CT imaging of the abdomen and pelvis was performed using the standard protocol following bolus administration of intravenous contrast. CONTRAST:  78mL OMNIPAQUE IOHEXOL 300 MG/ML  SOLN COMPARISON:  PET-CT 12/11/2020 FINDINGS: Lower chest: Heterogeneous soft tissue in the posterior lower left hemithorax is similar to prior PET-CT. Small left pleural effusion evident. Hepatobiliary: Tiny hypodensity in the anterior dome of liver measures 7 mm, too small and not definitely seen on previous PET-CT. There is no evidence for gallstones, gallbladder wall thickening, or pericholecystic fluid. No intrahepatic or extrahepatic biliary dilation. Pancreas: No focal mass lesion. No dilatation of the main duct. No intraparenchymal cyst. No peripancreatic edema. Spleen:  No splenomegaly. No focal mass lesion. Adrenals/Urinary Tract: No adrenal nodule or mass. Kidneys unremarkable. No evidence for hydroureter. The urinary bladder appears normal for the degree of distention. Stomach/Bowel: Stomach is unremarkable. No gastric wall thickening. No evidence of outlet obstruction. Duodenum is normally positioned as is the ligament of Treitz. No small bowel wall thickening.  No small bowel dilatation. No gross colonic mass. No colonic wall thickening. Vascular/Lymphatic: There is abdominal aortic atherosclerosis without aneurysm. Clustered upper normal para-aortic nodes in the upper abdomen are similar to prior. No pelvic sidewall lymphadenopathy. Reproductive: The prostate gland and seminal vesicles are unremarkable. Other: No intraperitoneal free fluid. Musculoskeletal: No worrisome lytic or sclerotic osseous abnormality. Degenerative disc disease noted lower lumbar spine. IMPRESSION: 1. No acute findings in the abdomen/pelvis. 2. Consolidative disease left lung base, incompletely visualized and better characterized on previous PET-CT. 3. Similar appearance of upper normal lymph nodes in the upper abdomen characterized as hypermetabolic on previous PET imaging. 4. 7 mm low-density lesion in the dome of the liver, stable since chest CT 10/13/2020. Continued attention on follow-up recommended. 5.  Aortic Atherosclerois (ICD10-170.0) Electronically Signed   By: Misty Stanley M.D.   On: 05/25/2021 10:25    Procedures Procedures   Medications Ordered in ED Medications  morphine 4 MG/ML injection 4 mg (has no administration in time range)  ondansetron (ZOFRAN) injection 4 mg (has no administration in time range)  lactated ringers bolus 1,000 mL (has no administration in time range)    ED Course  I have reviewed the triage vital signs and the nursing notes.  Pertinent labs & imaging results that were available during my care of the patient were reviewed by me and considered in my medical decision making (see chart for details).    MDM Rules/Calculators/A&P                          Patient is a 60 year old male with a history of recently diagnosed small cell lung cancer approximately 1 month ago who is waiting to follow-up with oncology for treatment options who has been on prednisone for the last month and continues to use his inhalers who is presenting with 10 days of  worsening abdominal pain.  He states the pain is currently a 12 out of 10.  He has been taking Tylenol and Advil at home without any relief.  The pain is in the upper abdomen and drinking some milk helps for a very short time and then the pain returns.  He has not had any hematemesis or melena.  On exam he does  have epigastric and left upper quadrant tenderness.  Labs show a CMP with mild AKI today based on his baseline creatinine most likely due to dehydration his he has not been eating or drinking as much.  CBC with a white count of 10 and hemoglobin stable at 12, UA without significant findings.  Patient's lipase today is elevated 142.  He does not take any medications that would affect his pancreas and denies any alcohol use.  Concern for possible metastatic disease versus ulcer given he has been on prednisone for the last 1 month.  Patient given IV fluids pain and nausea medication.  Also will give Protonix.  Low suspicion for perforated viscus.  10:46 AM Patient CT is negative for an acute process.  On reevaluation after pain medication GI cocktail and Protonix he is feeling much better.  Suspect patient has PUD related to recent prednisone use and at this time he thinks he has to be on this indefinitely.  We will start him on a PPI as well as Carafate in the meantime to help with the pain.  Patient given strict return precautions.  MDM   Amount and/or Complexity of Data Reviewed Clinical lab tests: ordered and reviewed Tests in the radiology section of CPT: ordered and reviewed Independent visualization of images, tracings, or specimens: yes     Final Clinical Impression(s) / ED Diagnoses Final diagnoses:  PUD (peptic ulcer disease)    Rx / DC Orders ED Discharge Orders          Ordered    sucralfate (CARAFATE) 1 g tablet  3 times daily with meals & bedtime        05/25/21 1050    omeprazole (PRILOSEC) 20 MG capsule  Daily        05/25/21 1050             Blanchie Dessert,  MD 05/25/21 1050

## 2021-05-29 ENCOUNTER — Inpatient Hospital Stay: Payer: Medicaid Other | Attending: Internal Medicine | Admitting: Internal Medicine

## 2021-05-29 ENCOUNTER — Other Ambulatory Visit: Payer: Self-pay

## 2021-05-29 VITALS — BP 142/81 | HR 89 | Temp 97.5°F | Resp 18 | Wt 137.6 lb

## 2021-05-29 DIAGNOSIS — C3490 Malignant neoplasm of unspecified part of unspecified bronchus or lung: Secondary | ICD-10-CM | POA: Insufficient documentation

## 2021-05-29 DIAGNOSIS — Z87891 Personal history of nicotine dependence: Secondary | ICD-10-CM | POA: Diagnosis not present

## 2021-05-29 DIAGNOSIS — M899 Disorder of bone, unspecified: Secondary | ICD-10-CM | POA: Diagnosis not present

## 2021-05-29 DIAGNOSIS — C3492 Malignant neoplasm of unspecified part of left bronchus or lung: Secondary | ICD-10-CM

## 2021-05-29 DIAGNOSIS — C3432 Malignant neoplasm of lower lobe, left bronchus or lung: Secondary | ICD-10-CM

## 2021-05-29 DIAGNOSIS — E43 Unspecified severe protein-calorie malnutrition: Secondary | ICD-10-CM

## 2021-05-29 NOTE — Progress Notes (Signed)
[place] [phone]   [phone]  CONSULT NOTE  REFERRING PHYSICIAN: Dr. Gardiner Rhyme  REASON FOR CONSULTATION:  60 years old white male recently diagnosed with lung cancer.  HPI Randall Valencia is a 60 y.o. male with past medical history significant for COPD, brain aneurysm as well as hyponatremia and pneumonia and long history of heavy smoking.  The patient presented to Dr. Alcide Clever at [facility] pulmonary and sleep clinic in early February 2022 complaining of shortness of breath with mild hemoptysis.  He was also complaining of stomach pain and receiving pain medication from friends and family.  The patient had imaging studies including CT scan of the chest as well as chest x-ray that showed left lung opacity.  He had a PET scan performed on December 11, 2020 and it showed a symmetric hypermetabolic soft tissue prominence in the central left lower lobe and left hilum with obstruction of the central left lower lobe bronchus and left lower lobe collapse.  This is highly suspicious for centrally obstructing bronchogenic carcinoma.  There was mild hypermetabolic adenopathy throughout the mediastinum, right hilum and upper abdominal retroperitoneum suspicious for metastatic disease.  There was also hypermetabolic lytic bone lesion involving T7 vertebrae suspicious for bone metastasis.  On January 09, 2021 the patient underwent flexible fiberoptic bronchoscopy by Dr. Alcide Clever.  The final pathology from [facility] [id number]) showed squamous cell carcinoma.  The neoplastic cells process pleomorphic, hyperchromatic nuclei and eosinophilic cytoplasm.  P40 immunostain shows diffuse nuclear positivity.  TTF-1 and Napsin A immunostains are negative.  The histomorphology and the immunoprofile supports the diagnosis of squamous cell carcinoma. The patient was referred to the [facility] cancer center but he declined to go at that time.  He was then referred to the [facility]  cancer center at [facility] but for some reason he did not have the appointment until today. He was supposed to have MRI of the brain in the interval but this was not done.  The patient has been considering alternative natural treatment with metabolic diet.  He was seen recently at the emergency department and CT scan of the abdomen and pelvis showed similar result of the left lower lobe lung mass. When seen today the patient continues to have the stomach pain and he mentions that he vomited some blood.  Is not clear if he is coughing at or vomiting at.  He continues to have left-sided chest pain with no significant shortness of breath but has cough.  He lost around 60 pounds in the last 6 months.  He has intermittent nausea with some vomiting but no diarrhea or constipation.  He denied having any headache or visual changes. Family history significant for father who died when he was young and does not know his medical history.  Mother still alive at age 19. The patient is single and has 5 children and many grandchildren.  He is currently on disability and used to work as a Biomedical scientist.  He was accompanied by his friend Randall Valencia.  The patient has a history of smoking 2 pack/day for around 30 years and quit 2 years ago.  He has no history of alcohol but he has been using pain medication from friends and family recently.  HPI  Past Medical History:  Diagnosis Date   Cerebral aneurysm     No past surgical history on file.  No family history on file.  Social History Social History   Tobacco Use   Smoking status: Every  Day    Pack years: 0.00   Smokeless tobacco: Never  Vaping Use   Vaping Use: Every day  Substance Use Topics   Alcohol use: Not Currently   Drug use: Not Currently    No Known Allergies  Current Outpatient Medications  Medication Sig Dispense Refill   budesonide-formoterol (SYMBICORT) 80-4.5 MCG/ACT inhaler Inhale 2 puffs into the lungs 2 (two) times daily. 1 Inhaler 12    omeprazole (PRILOSEC) 20 MG capsule Take 1 capsule (20 mg total) by mouth daily. 30 capsule 1   sucralfate (CARAFATE) 1 g tablet Take 1 tablet (1 g total) by mouth 4 (four) times daily -  with meals and at bedtime. 120 tablet 0   tiotropium (SPIRIVA HANDIHALER) 18 MCG inhalation capsule Place 1 capsule (18 mcg total) into inhaler and inhale daily. 30 capsule 2   feeding supplement, ENSURE ENLIVE, (ENSURE ENLIVE) LIQD Take 237 mLs by mouth 2 (two) times daily between meals. (Patient not taking: Reported on 05/29/2021) 237 mL 12   Ipratropium-Albuterol (COMBIVENT RESPIMAT) 20-100 MCG/ACT AERS respimat Inhale 1 puff into the lungs every 6 (six) hours as needed for up to 30 days for wheezing. 1 Inhaler 0   nicotine (NICODERM CQ - DOSED IN MG/24 HOURS) 21 mg/24hr patch Place 1 patch (21 mg total) onto the skin daily. (Patient not taking: Reported on 05/29/2021) 28 patch 0   predniSONE (DELTASONE) 10 MG tablet Prednisone 60 Mg po once daily for 3 days, then 40 Mg po once daily for 3 days, than 30 Mg po once daily for 3 days, then 20 Mg po once daily for 3 days and then 10 Mg po once daily for 3 days and stop (Patient not taking: Reported on 05/29/2021) 48 tablet 0   No current facility-administered medications for this visit.    Review of Systems  Constitutional: positive for anorexia, fatigue, and weight loss Eyes: negative Ears, nose, mouth, throat, and face: negative Respiratory: positive for cough and pleurisy/chest pain Cardiovascular: negative Gastrointestinal: positive for nausea and vomiting Genitourinary:negative Integument/breast: negative Hematologic/lymphatic: negative Musculoskeletal:positive for bone pain Neurological: negative Behavioral/Psych: negative Endocrine: negative Allergic/Immunologic: negative  Physical Exam  HFS:FSELT, healthy, no distress, well nourished, cachectic, and malnourished SKIN: skin color, texture, turgor are normal, no rashes or significant  lesions HEAD: Normocephalic, No masses, lesions, tenderness or abnormalities EYES: normal, PERRLA, Conjunctiva are pink and non-injected EARS: External ears normal OROPHARYNX:no exudate, no erythema, and lips, buccal mucosa, and tongue normal  NECK: supple, no adenopathy, no JVD LYMPH:  no palpable lymphadenopathy, no hepatosplenomegaly LUNGS: clear to auscultation , and palpation HEART: regular rate & rhythm, no murmurs, and no gallops ABDOMEN:abdomen soft, non-tender, normal bowel sounds, and no masses or organomegaly BACK: No CVA tenderness, Range of motion is normal EXTREMITIES:no joint deformities, effusion, or inflammation, no edema  NEURO: alert & oriented x 3 with fluent speech, no focal motor/sensory deficits  PERFORMANCE STATUS: ECOG 1  LABORATORY DATA: Lab Results  Component Value Date   WBC 10.7 (H) 05/24/2021   HGB 12.8 (L) 05/24/2021   HCT 38.8 (L) 05/24/2021   MCV 92.8 05/24/2021   PLT 228 05/24/2021      Chemistry      Component Value Date/Time   NA 139 05/24/2021 2127   K 4.0 05/24/2021 2127   CL 106 05/24/2021 2127   CO2 28 05/24/2021 2127   BUN 28 (H) 05/24/2021 2127   CREATININE 1.15 05/24/2021 2127      Component Value Date/Time   CALCIUM 8.7 (  L) 05/24/2021 2127   ALKPHOS 46 05/24/2021 2127   AST 19 05/24/2021 2127   ALT 11 05/24/2021 2127   BILITOT 0.6 05/24/2021 2127       RADIOGRAPHIC STUDIES: CT ABDOMEN PELVIS W CONTRAST  Result Date: 05/25/2021 CLINICAL DATA:  Small-cell lung cancer.  Epigastric pain. EXAM: CT ABDOMEN AND PELVIS WITH CONTRAST TECHNIQUE: Multidetector CT imaging of the abdomen and pelvis was performed using the standard protocol following bolus administration of intravenous contrast. CONTRAST:  29m OMNIPAQUE IOHEXOL 300 MG/ML  SOLN COMPARISON:  PET-CT 12/11/2020 FINDINGS: Lower chest: Heterogeneous soft tissue in the posterior lower left hemithorax is similar to prior PET-CT. Small left pleural effusion evident.  Hepatobiliary: Tiny hypodensity in the anterior dome of liver measures 7 mm, too small and not definitely seen on previous PET-CT. There is no evidence for gallstones, gallbladder wall thickening, or pericholecystic fluid. No intrahepatic or extrahepatic biliary dilation. Pancreas: No focal mass lesion. No dilatation of the main duct. No intraparenchymal cyst. No peripancreatic edema. Spleen:  No splenomegaly. No focal mass lesion. Adrenals/Urinary Tract: No adrenal nodule or mass. Kidneys unremarkable. No evidence for hydroureter. The urinary bladder appears normal for the degree of distention. Stomach/Bowel: Stomach is unremarkable. No gastric wall thickening. No evidence of outlet obstruction. Duodenum is normally positioned as is the ligament of Treitz. No small bowel wall thickening. No small bowel dilatation. No gross colonic mass. No colonic wall thickening. Vascular/Lymphatic: There is abdominal aortic atherosclerosis without aneurysm. Clustered upper normal para-aortic nodes in the upper abdomen are similar to prior. No pelvic sidewall lymphadenopathy. Reproductive: The prostate gland and seminal vesicles are unremarkable. Other: No intraperitoneal free fluid. Musculoskeletal: No worrisome lytic or sclerotic osseous abnormality. Degenerative disc disease noted lower lumbar spine. IMPRESSION: 1. No acute findings in the abdomen/pelvis. 2. Consolidative disease left lung base, incompletely visualized and better characterized on previous PET-CT. 3. Similar appearance of upper normal lymph nodes in the upper abdomen characterized as hypermetabolic on previous PET imaging. 4. 7 mm low-density lesion in the dome of the liver, stable since chest CT 10/13/2020. Continued attention on follow-up recommended. 5.  Aortic Atherosclerois (ICD10-170.0) Electronically Signed   By: EMisty StanleyM.D.   On: 05/25/2021 10:25    ASSESSMENT: This is a very pleasant 60years old white male diagnosed with stage IV (T3, N3, M1  C) non-small cell lung cancer, squamous cell carcinoma presented with soft tissue consolidation and mass in the central left lower lobe in addition to involvement of the left hilum and obstruction of the central left lower lobe bronchus as well as collapse of left lower lobe and bilateral hilar in addition to mediastinal lymphadenopathy and metastatic disease to the upper abdominal retroperitoneum and lytic bone lesion involving T7 vertebra diagnosed in February 2022 The [facility].  [facility] [place] [facility] [place] [facility] [place] of his condition.  PLAN: I had a lengthy discussion with the patient and his friend RJoseph Arttoday about his current disease stage, prognosis and treatment options. I recommended for the patient to have repeat PET scan as well as MRI of the brain to complete the staging work-up since his previous imaging studies were done around 6 months ago. I will also request his [facility] I will also arrange for the patient to see gastroenterology for evaluation of the suspicious hematemesis but I am sure that this is also  could be hemoptysis and the patient is not clear about where it is coming from. I recommended for the patient to see radiation oncology for consideration of palliative radiotherapy to the painful disease area but he declines and he does not believe and radiotherapy. I will see him back for follow-up visit in around 2 weeks for evaluation and more detailed discussion of his treatment options based on the final staging work-up. For the history of GERD and questionable hematemesis, I recommended for him to increase his dose of Prilosec to 20 mg p.o. twice daily and he will continue his treatment with Carafate.  I will also refer the patient to gastroenterology for evaluation. The patient was advised to call immediately if he has  any other concerning symptoms in the interval. The patient voices understanding of current disease status and treatment options and is in agreement with the current care plan.  All questions were answered. The patient knows to call the clinic with any problems, questions or concerns. We can certainly see the patient much sooner if necessary.  Thank you so much for allowing me to participate in the care of Randall Valencia. I will continue to follow up the patient with you and assist in his care.  The total time spent in the appointment was 90 minutes.  Disclaimer: This note was dictated with voice recognition software. Similar sounding words can inadvertently be transcribed and may not be corrected upon review.   Randall Valencia May 29, 2021, 2:56 PM

## 2021-06-05 ENCOUNTER — Ambulatory Visit (HOSPITAL_COMMUNITY): Admission: RE | Admit: 2021-06-05 | Payer: Medicaid Other | Source: Ambulatory Visit

## 2021-06-06 ENCOUNTER — Telehealth: Payer: Self-pay | Admitting: Internal Medicine

## 2021-06-06 ENCOUNTER — Telehealth: Payer: Self-pay | Admitting: Medical Oncology

## 2021-06-06 NOTE — Telephone Encounter (Signed)
Sch per 06/22 los, pt aware

## 2021-06-06 NOTE — Telephone Encounter (Signed)
Per ED , "Ulcer" from prednisone.   He could not go get his brain MRI due to "Ulcer pain ".   He kept talking over me.  He said he is "in a fetal position I hurt so bad". He was taking Pepto Bismol so I told him not to take it  -take his carafate . He continued talking over me . I told him he needs to go back to ED.

## 2021-06-14 ENCOUNTER — Other Ambulatory Visit: Payer: Self-pay | Admitting: Internal Medicine

## 2021-06-14 ENCOUNTER — Ambulatory Visit: Payer: Medicaid Other | Admitting: Internal Medicine

## 2021-06-14 ENCOUNTER — Other Ambulatory Visit (HOSPITAL_COMMUNITY): Payer: Medicaid Other

## 2021-06-14 ENCOUNTER — Telehealth: Payer: Self-pay | Admitting: *Deleted

## 2021-06-14 DIAGNOSIS — C3492 Malignant neoplasm of unspecified part of left bronchus or lung: Secondary | ICD-10-CM

## 2021-06-14 NOTE — Telephone Encounter (Signed)
Randall Valencia states he called [facility] for an appt and they told him that he needs a referral from Dr Julien Nordmann.

## 2021-06-15 ENCOUNTER — Telehealth: Payer: Self-pay | Admitting: Gastroenterology

## 2021-06-15 ENCOUNTER — Telehealth: Payer: Self-pay | Admitting: Medical Oncology

## 2021-06-15 ENCOUNTER — Other Ambulatory Visit (HOSPITAL_COMMUNITY): Payer: Self-pay

## 2021-06-15 ENCOUNTER — Ambulatory Visit (INDEPENDENT_AMBULATORY_CARE_PROVIDER_SITE_OTHER): Payer: Medicaid Other | Admitting: Gastroenterology

## 2021-06-15 ENCOUNTER — Encounter: Payer: Self-pay | Admitting: Gastroenterology

## 2021-06-15 VITALS — BP 110/62 | HR 96 | Ht 74.0 in | Wt 131.0 lb

## 2021-06-15 DIAGNOSIS — R1013 Epigastric pain: Secondary | ICD-10-CM

## 2021-06-15 DIAGNOSIS — C3492 Malignant neoplasm of unspecified part of left bronchus or lung: Secondary | ICD-10-CM

## 2021-06-15 MED ORDER — AMBULATORY NON FORMULARY MEDICATION: 0 refills | Status: DC

## 2021-06-15 MED ORDER — OMEPRAZOLE 20 MG PO CPDR: 20.0000 mg | DELAYED_RELEASE_CAPSULE | Freq: Two times a day (BID) | ORAL | 3 refills | Status: DC

## 2021-06-15 MED ORDER — OXYCODONE-ACETAMINOPHEN 10-325 MG PO TABS: 1.0000 | ORAL_TABLET | Freq: Four times a day (QID) | ORAL | 0 refills | Status: DC | PRN

## 2021-06-15 MED ORDER — SUCRALFATE 1 GM/10ML PO SUSP: 1.0000 g | Freq: Four times a day (QID) | ORAL | 1 refills | Status: DC

## 2021-06-15 NOTE — Telephone Encounter (Signed)
Inbound call from patient. States he went to the pharmacy and they are telling him the GI cocktail was not sent with the other medications. Best contact [phone]

## 2021-06-15 NOTE — Telephone Encounter (Addendum)
Referral-records faxed to Thoracic oncology. Received call back for reason for referral / I told [facility] the pt wanted a second opinion.  Email sent to Neoma Laming at [facility] re pt referral and to contact [facility]'s staff for other records, etc.

## 2021-06-15 NOTE — Patient Instructions (Addendum)
If you are age 62 or older, your body mass index should be between 23-30. Your Body mass index is 16.82 kg/m. If this is out of the aforementioned range listed, please consider follow up with your Primary Care Provider.  If you are age 88 or younger, your body mass index should be between 19-25. Your Body mass index is 16.82 kg/m. If this is out of the aformentioned range listed, please consider follow up with your Primary Care Provider.   You have been scheduled for an endoscopy. Please follow written instructions given to you at your visit today. If you use inhalers (even only as needed), please bring them with you on the day of your procedure.   We have sent the following medications to your pharmacy for you to pick up at your convenience: GI cockail  You have been scheduled for an endoscopy. Please follow written instructions given to you at your visit today. If you use inhalers (even only as needed), please bring them with you on the day of your procedure.   We have sent the following medications to your pharmacy for you to pick up at your convenience: Percocet , GI cocktail, and Prilosec 20 mg     The [facility] providers would like to encourage you to use [facility] to communicate with providers for non-urgent requests or questions.  Due to long hold times on the telephone, sending your provider a message by [facility] may be a faster and more efficient way to get a response.  Please allow 48 business hours for a response.  Please remember that this is for non-urgent requests.   Low-FODMAP Eating Plan  FODMAP stands for fermentable oligosaccharides, disaccharides, monosaccharides, and polyols. These are sugars that are hard for some people to digest. A low-FODMAP eating plan may help some people who have irritable bowel syndrome (IBS) and certain other bowel (intestinal) diseases to manage their symptoms. This meal plan can be complicated to follow. Work with a diet and nutrition specialist  (dietitian) to make a low-FODMAP eating plan that is right for you. A dietitian can helpmake sure that you get enough nutrition from this diet. What are tips for following this plan? Reading food labels Check labels for hidden FODMAPs such as: High-fructose syrup. Honey. Agave. Natural fruit flavors. Onion or garlic powder. Choose low-FODMAP foods that contain 3-4 grams of fiber per serving. Check food labels for serving sizes. Eat only one serving at a time to make sure FODMAP levels stay low. Shopping Shop with a list of foods that are recommended on this diet and make a meal plan. Meal planning Follow a low-FODMAP eating plan for up to 6 weeks, or as told by your health care provider or dietitian. To follow the eating plan: Eliminate high-FODMAP foods from your diet completely. Choose only low-FODMAP foods to eat. You will do this for 2-6 weeks. Gradually reintroduce high-FODMAP foods into your diet one at a time. Most people should wait a few days before introducing the next new high-FODMAP food into their meal plan. Your dietitian can recommend how quickly you may reintroduce foods. Keep a daily record of what and how much you eat and drink. Make note of any symptoms that you have after eating. Review your daily record with a dietitian regularly to identify which foods you can eat and which foods you should avoid. General tips Drink enough fluid each day to keep your urine pale yellow. Avoid processed foods. These often have added sugar and may be high  in FODMAPs. Avoid most dairy products, whole grains, and sweeteners. Work with a dietitian to make sure you get enough fiber in your diet. Avoid high FODMAP foods at meals to manage symptoms. Recommended foods Fruits Bananas, oranges, tangerines, lemons, limes, blueberries, raspberries, strawberries, grapes, cantaloupe, honeydew melon, kiwi, papaya, passion fruit, and pineapple. Limited amounts of dried cranberries, banana chips, and  shreddedcoconut. Vegetables Eggplant, zucchini, cucumber, peppers, green beans, bean sprouts, lettuce, arugula, kale, Swiss chard, spinach, collard greens, bok choy, summer squash, potato, and tomato. Limited amounts of corn, carrot, and sweet potato. Greenparts of scallions. Grains Gluten-free grains, such as rice, oats, buckwheat, quinoa, corn, polenta, andmillet. Gluten-free pasta, bread, or cereal. Rice noodles. Corn tortillas. Meats and other proteins Unseasoned beef, pork, poultry, or fish. Eggs. Berniece Salines. Tofu (firm) and tempeh. Limited amounts of nuts and seeds, such as almonds, walnuts, [place] nuts,pecans, peanuts, nut butters, pumpkin seeds, chia seeds, and sunflower seeds. Dairy Lactose-free milk, yogurt, and kefir. Lactose-free cottage cheese and ice cream. Non-dairy milks, such as almond, coconut, hemp, and rice milk. Non-dairy yogurt. Limited amounts of goat cheese, brie, mozzarella, parmesan, swiss, andother hard cheeses. Fats and oils Butter-free spreads. Vegetable oils, such as olive, canola, and sunflower oil. Seasoning and other foods Artificial sweeteners with names that do not end in "ol," such as aspartame, saccharine, and stevia. Maple syrup, white table sugar, raw sugar, brown sugar, and molasses. Mayonnaise, soy sauce, and tamari. Fresh basil, coriander,parsley, rosemary, and thyme. Beverages Water and mineral water. Sugar-sweetened soft drinks. Small amounts of orangejuice or cranberry juice. Black and green tea. Most dry wines. Coffee. The items listed above may not be a complete list of foods and beverages you can eat. Contact a dietitian for more information. Foods to avoid Fruits Fresh, dried, and juiced forms of apple, pear, watermelon, peach, plum, cherries, apricots, blackberries, boysenberries, figs, nectarines, and mango.Avocado. Vegetables Chicory root, artichoke, asparagus, cabbage, snow peas, Brussels sprouts, broccoli, sugar snap peas, mushrooms, celery, and  cauliflower. Onions, garlic,leeks, and the white part of scallions. Grains Wheat, including kamut, durum, and semolina. Barley and bulgur. Couscous.Wheat-based cereals. Wheat noodles, bread, crackers, and pastries. Meats and other proteins Fried or fatty meat. Sausage. Cashews and pistachios. Soybeans, baked beans, black beans, chickpeas, kidney beans, fava beans, navy beans, lentils,black-eyed peas, and split peas. Dairy Milk, yogurt, ice cream, and soft cheese. Cream and sour cream. Milk-basedsauces. Custard. Buttermilk. Soy milk. Seasoning and other foods Any sugar-free gum or candy. Foods that contain artificial sweeteners such as sorbitol, mannitol, isomalt, or xylitol. Foods that contain honey, high-fructose corn syrup, or agave. Bouillon, vegetable stock, beef stock, and chicken stock. Garlic and onion powder. Condiments made with onion, such ashummus, chutney, pickles, relish, salad dressing, and salsa. Tomato paste. Beverages Chicory-based drinks. Coffee substitutes. Chamomile tea. Fennel tea. Sweet or fortified wines such as port or sherry. Diet soft drinks made with isomalt, mannitol, maltitol, sorbitol, or xylitol. Apple, pear, and mango juice. Juiceswith high-fructose corn syrup. The items listed above may not be a complete list of foods and beverages you should avoid. Contact a dietitian for more information. Summary FODMAP stands for fermentable oligosaccharides, disaccharides, monosaccharides, and polyols. These are sugars that are hard for some people to digest. A low-FODMAP eating plan is a short-term diet that helps to ease symptoms of certain bowel diseases. The eating plan usually lasts up to 6 weeks. After that, high-FODMAP foods are reintroduced gradually and one at a time. This can help you find out which foods may be causing symptoms. A  low-FODMAP eating plan can be complicated. It is best to work with a dietitian who has experience with this type of plan. This information  is not intended to replace advice given to you by your health care provider. Make sure you discuss any questions you have with your healthcare provider. Document Revised: 04/13/2020 Document Reviewed: 04/13/2020 Elsevier Patient Education  2022 [employer].  It was a pleasure to see you today!  Thank you for trusting me with your gastrointestinal care!     Scott E. Candis Schatz MD

## 2021-06-15 NOTE — Telephone Encounter (Signed)
Pharmacy ([facility])calling to inform med Ambulatory non formulary  has to be sent to a compound pharmacy.. Plz advise  thanks

## 2021-06-15 NOTE — Progress Notes (Signed)
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 Julien Nordmann for further evaluation of epigastric pain.  The patient is not a very detailed medical historian, and some elements of his history were inconsistent or incongruent with the medical record.  He reports that has been having this epigastric pain for several weeks now.  The patient states that he underwent bronchoscopy and had pain afterwards and started taking ibuprofen.  He is also prescribed steroids around the same time (it is unclear to me why he was started on steroids, but I suspect it was for his COPD).  After taking the ibuprofen for a while he developed abdominal pain in his epigastrium.  He went to the emergency room June 16th because of this pain where a CT was performed and was unremarkable other than the findings consistent with his known metastatic lung cancer.  Labs at that time were notable for a mildly elevated BUN (28), mild anemia (hemoglobin 12) mildly elevated lipase.  His pain improved with a GI cocktail and some morphine and it was felt that he most likely had peptic ulcer disease.  He was discharged home with omeprazole and Carafate. Following that ER visit, the patient states that his pain continued.  He then saw his oncologist who placed a referral to gastroenterology.  Patient states that he stopped taking the omeprazole after a few days because "it did not do anything" and that he has been aching increasingly larger amounts of ibuprofen since then because "it is the only thing that helps with the pain and no one will give me anything else".  Patient states Tylenol does not help with the pain.  He states that he has been taking 3 to 4 tablets of ibuprofen every few hours for the last week or so.  He is also been taking high doses of Pepto-Bismol (took an entire bottle yesterday). Eating makes the pain worse, and he feels nauseated but he is only  vomited once.  He has been eating mostly a soft diet with mashed potatoes and ice cream performing the bulk of his diet.  His stools have been very dark, but have been formed and solid and difficult to pass.  He denies any frank blood.  He states that the pain is near constant 8-10 out of 10 that he has been in this state ever since his ER visit 3 weeks ago.  States that the pain today is not significantly different than the pain several weeks ago.   Past Medical History:  Diagnosis Date   Asthma    Cerebral aneurysm    Lung cancer ([place])    PUD (peptic ulcer disease)      History reviewed. No pertinent surgical history. Family History  Problem Relation Age of Onset   Alzheimer's disease Mother    Schizophrenia Father    Suicidality Father    Social History   Tobacco Use   Smoking status: Former    Pack years: 0.00    Types: Cigarettes   Smokeless tobacco: Never  Vaping Use   Vaping Use: Never used  Substance Use Topics   Alcohol use: Not Currently   Drug use: Not Currently   Current Outpatient Medications  Medication Sig Dispense Refill   AMBULATORY NON FORMULARY MEDICATION GI cocktail :90 ml -viscous lidocaine, 90 ml-10 mg/42ml dicyclomine ,270 ml maalox.  Take 10 ml every four hours. 450 mL 0   budesonide-formoterol (SYMBICORT) 80-4.5 MCG/ACT  inhaler Inhale 2 puffs into the lungs 2 (two) times daily. 1 Inhaler 12   feeding supplement, ENSURE ENLIVE, (ENSURE ENLIVE) LIQD Take 237 mLs by mouth 2 (two) times daily between meals. 237 mL 12   Ipratropium-Albuterol (COMBIVENT RESPIMAT) 20-100 MCG/ACT AERS respimat Inhale 1 puff into the lungs every 6 (six) hours as needed for up to 30 days for wheezing. 1 Inhaler 0   omeprazole (PRILOSEC) 20 MG capsule Take 1 capsule (20 mg total) by mouth 2 (two) times daily. 60 capsule 3   oxyCODONE-acetaminophen (PERCOCET) 10-325 MG tablet Take 1 tablet by mouth every 6 (six) hours as needed for pain. 20 tablet 0   sucralfate (CARAFATE) 1  GM/10ML suspension Take 10 mLs (1 g total) by mouth 4 (four) times daily. 420 mL 1   tiotropium (SPIRIVA HANDIHALER) 18 MCG inhalation capsule Place 1 capsule (18 mcg total) into inhaler and inhale daily. 30 capsule 2   No current facility-administered medications for this visit.   No Known Allergies   Review of Systems: All systems reviewed and negative except where noted in HPI.    CT ABDOMEN PELVIS W CONTRAST  Result Date: 05/25/2021 CLINICAL DATA:  Small-cell lung cancer.  Epigastric pain. EXAM: CT ABDOMEN AND PELVIS WITH CONTRAST TECHNIQUE: Multidetector CT imaging of the abdomen and pelvis was performed using the standard protocol following bolus administration of intravenous contrast. CONTRAST:  48mL OMNIPAQUE IOHEXOL 300 MG/ML  SOLN COMPARISON:  PET-CT 12/11/2020 FINDINGS: Lower chest: Heterogeneous soft tissue in the posterior lower left hemithorax is similar to prior PET-CT. Small left pleural effusion evident. Hepatobiliary: Tiny hypodensity in the anterior dome of liver measures 7 mm, too small and not definitely seen on previous PET-CT. There is no evidence for gallstones, gallbladder wall thickening, or pericholecystic fluid. No intrahepatic or extrahepatic biliary dilation. Pancreas: No focal mass lesion. No dilatation of the main duct. No intraparenchymal cyst. No peripancreatic edema. Spleen:  No splenomegaly. No focal mass lesion. Adrenals/Urinary Tract: No adrenal nodule or mass. Kidneys unremarkable. No evidence for hydroureter. The urinary bladder appears normal for the degree of distention. Stomach/Bowel: Stomach is unremarkable. No gastric wall thickening. No evidence of outlet obstruction. Duodenum is normally positioned as is the ligament of Treitz. No small bowel wall thickening. No small bowel dilatation. No gross colonic mass. No colonic wall thickening. Vascular/Lymphatic: There is abdominal aortic atherosclerosis without aneurysm. Clustered upper normal para-aortic nodes  in the upper abdomen are similar to prior. No pelvic sidewall lymphadenopathy. Reproductive: The prostate gland and seminal vesicles are unremarkable. Other: No intraperitoneal free fluid. Musculoskeletal: No worrisome lytic or sclerotic osseous abnormality. Degenerative disc disease noted lower lumbar spine. IMPRESSION: 1. No acute findings in the abdomen/pelvis. 2. Consolidative disease left lung base, incompletely visualized and better characterized on previous PET-CT. 3. Similar appearance of upper normal lymph nodes in the upper abdomen characterized as hypermetabolic on previous PET imaging. 4. 7 mm low-density lesion in the dome of the liver, stable since chest CT 10/13/2020. Continued attention on follow-up recommended. 5.  Aortic Atherosclerois (ICD10-170.0) Electronically Signed   By: Misty Stanley M.D.   On: 05/25/2021 10:25    Physical Exam: BP 110/62   Pulse 96   Ht 6\' 2"  (1.88 m)   Wt 131 lb (59.4 kg)   BMI 16.82 kg/m  Constitutional: Cachectic appearing Caucasian male in moderate distress from pain.  Patient remains bent over in pain throughout most of the clinical encounter and is unable to lie completely supine for his abdominal exam  HEENT: Normocephalic and atraumatic. Conjunctivae are normal. No scleral icterus.  Edentulous except for a single lower tooth which appears dead Cardiovascular: Normal rate, regular rhythm.  Pulmonary/chest: Effort normal and breath sounds normal. No rales or rhonchi.  Expiratory wheezing b/l Abdominal: Soft, nondistended.  Bowel sounds active throughout. There are no masses palpable. No hepatomegaly.  Voluntary guarding and pain with light palpation in the epigastrium and RUQ.  Pt would not lie fully supine for the abdominal exam due to pain Extremities: no edema Neurological: Alert and oriented to person place and time. Skin: Skin is warm and dry. No rashes noted. Psychiatric: Normal mood and affect. Behavior is normal.   ASSESSMENT AND  PLAN: 60 year old male with metastatic squamous cell lung cancer with several weeks of severe epigastric pain and anorexia in the setting of excessive NSAID use with recent steroid course.  His abdominal pain is almost certainly due to NSAID-induced peptic ulcer disease.  Although his current clinical presentation is concerning given his guarding, it appears that he was this way 3 weeks ago is reassuring against a perforated ulcer. I repeatedly informed the patient that the ibuprofen was the cause of his pain and that he absolutely needed to stop taking it in order for the pain to get better.  However, the patient was insistent that unless we gave him something else for the pain, he would continue to take the ibuprofen because it was the only thing that would temporarily relieve the pain.  We discussed the pathophysiology of peptic ulcer disease and the principles of ulcer healing. I offered him an endoscopy today to more definitively identify peptic ulcer disease as a cause of his pain, but he did not have a ride available.  We will therefore perform a diagnostic upper endoscopy next week. In the meantime, given his physical duress, I agreed to give him a short term supply of narcotics so that he would stop using NSAIDs for pain control.  After 5 days, he should use Tylenol for pain control. His pain should also improve with continued use of omeprazole 20 mg twice a day and Carafate suspension 4 times daily.  We will also give a GI cocktail for him to take for a few days at home.  I will repeat a CBC and CMP today, and if either of these suggest bleeding from the ulcer or significant dehydration, I will call him and have him go to the emergency room.  Epigastric pain, presumed PUD - Upper endoscopy July 14 at 7:30 AM - Short-term pain control with Percocet 10/325 every 4 hours as needed for pain #20 - Use Tylenol for pain control after Percocet - Absolutely no NSAIDs - Omeprazole 20 mg p.o. twice a  day - Carafate suspension 1 g 4 times daily - GI cocktail (viscous lidocaine, Maalox, dicylomine) 10 mL qhrs prn - CBC, CMP now  A total of 75 minutes was spent reviewing the patient's medical record, examining the patient, discussing the plan of care with the patient and the care team and documenting in the medical record.

## 2021-06-15 NOTE — Telephone Encounter (Signed)
Returned patient's call but patient did not answer and does not have a VM set up. I also sent patient a mychart message.

## 2021-06-15 NOTE — Telephone Encounter (Signed)
Patient returned call I let him know that the GI cocktail has been sent to [facility] in [place]. Also told patient to come back for labs patient stated he would come back for labs.

## 2021-06-15 NOTE — Addendum Note (Signed)
Addended by: Darrall Dears on: 06/15/2021 01:27 PM   Modules accepted: Orders

## 2021-06-18 ENCOUNTER — Telehealth: Payer: Self-pay | Admitting: Medical Oncology

## 2021-06-18 ENCOUNTER — Ambulatory Visit: Payer: Medicaid Other | Admitting: Internal Medicine

## 2021-06-18 NOTE — Telephone Encounter (Signed)
Cancelled all appts  Pt stated : " I am going to [facility] for my treatment.I am not coming to the [facility] for my care. Cancel my appts ."

## 2021-06-19 ENCOUNTER — Other Ambulatory Visit (INDEPENDENT_AMBULATORY_CARE_PROVIDER_SITE_OTHER): Payer: Medicaid Other

## 2021-06-19 DIAGNOSIS — R1013 Epigastric pain: Secondary | ICD-10-CM

## 2021-06-19 DIAGNOSIS — C3492 Malignant neoplasm of unspecified part of left bronchus or lung: Secondary | ICD-10-CM

## 2021-06-19 LAB — CBC WITH DIFFERENTIAL/PLATELET
Basophils Absolute: 0 10*3/uL (ref 0.0–0.1)
Basophils Relative: 0.3 % (ref 0.0–3.0)
Eosinophils Absolute: 0.3 10*3/uL (ref 0.0–0.7)
Eosinophils Relative: 2.9 % (ref 0.0–5.0)
HCT: 38.4 % — ABNORMAL LOW (ref 39.0–52.0)
Hemoglobin: 13.3 g/dL (ref 13.0–17.0)
Lymphocytes Relative: 27.7 % (ref 12.0–46.0)
Lymphs Abs: 2.4 10*3/uL (ref 0.7–4.0)
MCHC: 34.7 g/dL (ref 30.0–36.0)
MCV: 90.1 fl (ref 78.0–100.0)
Monocytes Absolute: 0.5 10*3/uL (ref 0.1–1.0)
Monocytes Relative: 5.5 % (ref 3.0–12.0)
Neutro Abs: 5.5 10*3/uL (ref 1.4–7.7)
Neutrophils Relative %: 63.6 % (ref 43.0–77.0)
Platelets: 254 10*3/uL (ref 150.0–400.0)
RBC: 4.26 Mil/uL (ref 4.22–5.81)
RDW: 13.4 % (ref 11.5–15.5)
WBC: 8.6 10*3/uL (ref 4.0–10.5)

## 2021-06-19 LAB — COMPREHENSIVE METABOLIC PANEL
ALT: 7 U/L (ref 0–53)
AST: 14 U/L (ref 0–37)
Albumin: 3.8 g/dL (ref 3.5–5.2)
Alkaline Phosphatase: 47 U/L (ref 39–117)
BUN: 23 mg/dL (ref 6–23)
CO2: 30 mEq/L (ref 19–32)
Calcium: 9.5 mg/dL (ref 8.4–10.5)
Chloride: 104 mEq/L (ref 96–112)
Creatinine, Ser: 1.08 mg/dL (ref 0.40–1.50)
GFR: 73.94 mL/min (ref >60.00)
Glucose, Bld: 144 mg/dL — ABNORMAL HIGH (ref 70–99)
Potassium: 4.9 mEq/L (ref 3.5–5.1)
Sodium: 139 mEq/L (ref 135–145)
Total Bilirubin: 0.4 mg/dL (ref 0.2–1.2)
Total Protein: 6.7 g/dL (ref 6.0–8.3)

## 2021-06-20 ENCOUNTER — Telehealth: Payer: Self-pay | Admitting: Gastroenterology

## 2021-06-20 NOTE — Telephone Encounter (Signed)
Patient calling to inform he finished prescribed pain meds. Patient states "he was in pain so he took ibuprofen" patient  states "he was told not to take ibuprofen".   Pt scheduled for EGD 06/21/21. Plz advise  thank you

## 2021-06-20 NOTE — Telephone Encounter (Signed)
Patient was told he could have EGD tomorrow after taking Ibuprofen today. Not that it was okay to take ibuprofen in general.

## 2021-06-20 NOTE — Telephone Encounter (Signed)
Called patient and let him know that it was okay that he took ibuprofen today. I informed patient that he could not have any solid foods after midnight tonight and no liquids 3 hours prior to his procedure.

## 2021-06-20 NOTE — Telephone Encounter (Signed)
Patient reports that he has completed the percocet rx that was given last week.  He has resumed taking ibuprofen.  We discussed the importance of avoiding Ibuprofen and all NSAIDS.  He states "I am going to take what is available to me",  He refuses to try Tylenol as previously instructed.  He is scheduled for EGD in the am at 7:30.  He confirms he is taking omeprazole BID and carafate QID.  Asking for additional pain meds.  "I am going to take what I have"  "I refuse to stay in pain when the ibuprofen works".

## 2021-06-21 ENCOUNTER — Ambulatory Visit (AMBULATORY_SURGERY_CENTER): Payer: Medicaid Other | Admitting: Gastroenterology

## 2021-06-21 ENCOUNTER — Other Ambulatory Visit: Payer: Self-pay

## 2021-06-21 ENCOUNTER — Encounter: Payer: Self-pay | Admitting: Gastroenterology

## 2021-06-21 VITALS — BP 140/71 | HR 80 | Temp 98.2°F | Resp 17 | Ht 74.0 in | Wt 131.0 lb

## 2021-06-21 DIAGNOSIS — K2990 Gastroduodenitis, unspecified, without bleeding: Secondary | ICD-10-CM

## 2021-06-21 DIAGNOSIS — K449 Diaphragmatic hernia without obstruction or gangrene: Secondary | ICD-10-CM

## 2021-06-21 DIAGNOSIS — R1013 Epigastric pain: Secondary | ICD-10-CM

## 2021-06-21 DIAGNOSIS — K2970 Gastritis, unspecified, without bleeding: Secondary | ICD-10-CM

## 2021-06-21 MED ORDER — SODIUM CHLORIDE 0.9 % IV SOLN
500.0000 mL | Freq: Once | INTRAVENOUS | Status: DC
Start: 2021-06-21 — End: 2022-03-18

## 2021-06-21 NOTE — Patient Instructions (Signed)
Resume previous medications.  Await pathology for final recommendations.  Handouts on findings given to patient.    Use Prilosec 20 mg by mouth daily Continue to avoid ibuprofen, naproxen or other anti-inflammatory medicines    YOU HAD AN ENDOSCOPIC PROCEDURE TODAY AT [facility]:   Refer to the procedure report that was given to you for any specific questions about what was found during the examination.  If the procedure report does not answer your questions, please call your gastroenterologist to clarify.  If you requested that your care partner not be given the details of your procedure findings, then the procedure report has been included in a sealed envelope for you to review at your convenience later.  YOU SHOULD EXPECT: Some feelings of bloating in the abdomen. Passage of more gas than usual.  Walking can help get rid of the air that was put into your GI tract during the procedure and reduce the bloating. If you had a lower endoscopy (such as a colonoscopy or flexible sigmoidoscopy) you may notice spotting of blood in your stool or on the toilet paper. If you underwent a bowel prep for your procedure, you may not have a normal bowel movement for a few days.  Please Note:  You might notice some irritation and congestion in your nose or some drainage.  This is from the oxygen used during your procedure.  There is no need for concern and it should clear up in a day or so.  SYMPTOMS TO REPORT IMMEDIATELY:   Following upper endoscopy (EGD)  Vomiting of blood or coffee ground material  New chest pain or pain under the shoulder blades  Painful or persistently difficult swallowing  New shortness of breath  Fever of 100F or higher  Black, tarry-looking stools  For urgent or emergent issues, a gastroenterologist can be reached at any hour by calling [phone]. Do not use MyChart messaging for urgent concerns.    DIET:  We do recommend a small meal at first, but then you  may proceed to your regular diet.  Drink plenty of fluids but you should avoid alcoholic beverages for 24 hours.  ACTIVITY:  You should plan to take it easy for the rest of today and you should NOT DRIVE or use heavy machinery until tomorrow (because of the sedation medicines used during the test).    FOLLOW UP: Our staff will call the number listed on your records 48-72 hours following your procedure to check on you and address any questions or concerns that you may have regarding the information given to you following your procedure. If we do not reach you, we will leave a message.  We will attempt to reach you two times.  During this call, we will ask if you have developed any symptoms of COVID 19. If you develop any symptoms (ie: fever, flu-like symptoms, shortness of breath, cough etc.) before then, please call [phone].  If you test positive for Covid 19 in the 2 weeks post procedure, please call and report this information to [place].    If any biopsies were taken you will be contacted by phone or by letter within the next 1-3 weeks.  Please call us at [phone] if you have not heard about the biopsies in 3 weeks.    SIGNATURES/CONFIDENTIALITY: You and/or your care partner have signed paperwork which will be entered into your electronic medical record.  These signatures attest to the fact that that the information above on your After  Visit Summary has been reviewed and is understood.  Full responsibility of the confidentiality of this discharge information lies with you and/or your care-partner.

## 2021-06-21 NOTE — Op Note (Signed)
[facility] Patient Name: Randall Valencia Procedure Date: 06/21/2021 7:31 AM MRN: [id number] Endoscopist: Nicki Reaper E. Candis Schatz , MD Age: 60 Referring MD:  Date of Birth: May 11, 1961 Gender: Male Account #: [id number] Procedure:                Upper GI endoscopy Indications:              Epigastric abdominal pain Medicines:                Monitored Anesthesia Care Procedure:                Pre-Anesthesia Assessment:                           - Prior to the procedure, a History and Physical                            was performed, and patient medications and                            allergies were reviewed. The patient is competent.                            The risks and benefits of the procedure and the                            sedation options and risks were discussed with the                            patient. All questions were answered and informed                            consent was obtained. Patient identification and                            proposed procedure were verified by the physician,                            the anesthetist and the technician in the endoscopy                            suite. Mental Status Examination: alert and                            oriented. Airway Examination: Mallampati Class II                            (the uvula but not tonsillar pillars visualized).                            Respiratory Examination: clear to auscultation. CV                            Examination: normal. Prophylactic Antibiotics: The  patient does not require prophylactic antibiotics.                            Prior Anticoagulants: The patient has taken no                            previous anticoagulant or antiplatelet agents. ASA                            Grade Assessment: III - A patient with severe                            systemic disease. After reviewing the risks and                            benefits, the  patient was deemed in satisfactory                            condition to undergo the procedure. The anesthesia                            plan was to use monitored anesthesia care (MAC).                            Immediately prior to administration of medications,                            the patient was re-assessed for adequacy to receive                            sedatives. The heart rate, respiratory rate, oxygen                            saturations, blood pressure, adequacy of pulmonary                            ventilation, and response to care were monitored                            throughout the procedure. The physical status of                            the patient was re-assessed after the procedure.                           After obtaining informed consent, the endoscope was                            passed under direct vision. Throughout the                            procedure, the patient's blood pressure, pulse, and  oxygen saturations were monitored continuously. The                            GIF [id number] #[id number] was introduced through the                            mouth, and advanced to the third part of duodenum.                            The upper GI endoscopy was accomplished without                            difficulty. The patient tolerated the procedure                            well. Scope In: Scope Out: Findings:                 The examined esophagus was normal.                           The Z-line was irregular.                           Esophagogastric landmarks were identified: the                            Z-line was found at 41 cm, the upper extent of the                            gastric folds was found at 41 cm and the site of                            hiatal narrowing was found at 45 cm from the                            incisors.                           A 4 cm hiatal hernia was present.                            Diffuse mildly erythematous mucosa was found in the                            gastric body and in the gastric antrum. Biopsies                            were taken with a cold forceps for Helicobacter                            pylori testing. Estimated blood loss was minimal.                           The exam of the  stomach was otherwise normal.                           The examined duodenum was normal. Complications:            No immediate complications. Estimated Blood Loss:     Estimated blood loss: none. Impression:               - Normal esophagus.                           - Z-line irregular.                           - Esophagogastric landmarks identified.                           - 4 cm hiatal hernia.                           - Erythematous mucosa in the gastric body and                            antrum. Biopsied.                           - Normal examined duodenum.                           - No abnormalities to explain patient's significant                            abdominal pain. Recommendation:           - Discharge patient to home (with escort).                           - Resume previous diet.                           - Use Prilosec (omeprazole) 20 mg PO daily.                           - Would continue to avoid ibuprofen, naproxen, or                            other non-steroidal anti-inflammatory drugs.                           - Okay to stop carafate if not helping with pain.                           - Follow up in GI clinic after biopsies complete Mayari Matus E. Candis Schatz, MD 06/21/2021 8:03:17 AM This report has been signed electronically.

## 2021-06-21 NOTE — Progress Notes (Signed)
Called to room to assist during endoscopic procedure.  Patient ID and intended procedure confirmed with present staff. Received instructions for my participation in the procedure from the performing physician.  

## 2021-06-21 NOTE — Progress Notes (Signed)
Medical history reviewed with no changes noted. VS assessed by C.W 

## 2021-06-21 NOTE — Progress Notes (Signed)
PT taken to PACU. Monitors in place. VSS. Report given to RN. 

## 2021-06-25 ENCOUNTER — Telehealth: Payer: Self-pay | Admitting: *Deleted

## 2021-06-25 NOTE — Telephone Encounter (Signed)
  Follow up Call-  Call back number 06/21/2021  Post procedure Call Back phone  # [phone]  Permission to leave phone message Yes  Some recent data might be hidden     Patient questions:  Do you have a fever, pain , or abdominal swelling? No. Pain Score  0 *  Have you tolerated food without any problems? Yes.    Have you been able to return to your normal activities? Yes.    Do you have any questions about your discharge instructions: Diet   No. Medications  No. Follow up visit  No.  Do you have questions or concerns about your Care? No.  Actions: * If pain score is 4 or above: No action needed, pain <4.

## 2021-06-25 NOTE — Telephone Encounter (Signed)
Unable to leave message [id number] f/u call, call can't be completed as dialed

## 2021-06-26 ENCOUNTER — Encounter: Payer: Self-pay | Admitting: Gastroenterology

## 2021-07-16 ENCOUNTER — Encounter: Payer: Medicaid Other | Admitting: Gastroenterology

## 2021-08-20 ENCOUNTER — Observation Stay (HOSPITAL_COMMUNITY): Payer: Medicaid Other

## 2021-08-20 ENCOUNTER — Emergency Department (HOSPITAL_COMMUNITY): Payer: Medicaid Other

## 2021-08-20 ENCOUNTER — Encounter (HOSPITAL_COMMUNITY): Payer: Self-pay

## 2021-08-20 ENCOUNTER — Other Ambulatory Visit: Payer: Self-pay

## 2021-08-20 ENCOUNTER — Observation Stay (HOSPITAL_COMMUNITY)
Admission: EM | Admit: 2021-08-20 | Discharge: 2021-08-21 | Discharge status: Against Medical Advice | Payer: Medicaid Other | Attending: Family Medicine | Admitting: Family Medicine

## 2021-08-20 DIAGNOSIS — J9601 Acute respiratory failure with hypoxia: Secondary | ICD-10-CM | POA: Diagnosis not present

## 2021-08-20 DIAGNOSIS — E860 Dehydration: Principal | ICD-10-CM | POA: Insufficient documentation

## 2021-08-20 DIAGNOSIS — Z20822 Contact with and (suspected) exposure to covid-19: Secondary | ICD-10-CM | POA: Diagnosis not present

## 2021-08-20 DIAGNOSIS — C3490 Malignant neoplasm of unspecified part of unspecified bronchus or lung: Secondary | ICD-10-CM | POA: Diagnosis not present

## 2021-08-20 DIAGNOSIS — E871 Hypo-osmolality and hyponatremia: Secondary | ICD-10-CM | POA: Diagnosis not present

## 2021-08-20 DIAGNOSIS — Z79899 Other long term (current) drug therapy: Secondary | ICD-10-CM | POA: Diagnosis not present

## 2021-08-20 DIAGNOSIS — J189 Pneumonia, unspecified organism: Secondary | ICD-10-CM | POA: Diagnosis not present

## 2021-08-20 DIAGNOSIS — C3492 Malignant neoplasm of unspecified part of left bronchus or lung: Secondary | ICD-10-CM | POA: Diagnosis present

## 2021-08-20 DIAGNOSIS — R112 Nausea with vomiting, unspecified: Secondary | ICD-10-CM | POA: Diagnosis not present

## 2021-08-20 DIAGNOSIS — Z87891 Personal history of nicotine dependence: Secondary | ICD-10-CM | POA: Diagnosis not present

## 2021-08-20 DIAGNOSIS — E43 Unspecified severe protein-calorie malnutrition: Secondary | ICD-10-CM | POA: Diagnosis present

## 2021-08-20 DIAGNOSIS — E8809 Other disorders of plasma-protein metabolism, not elsewhere classified: Secondary | ICD-10-CM

## 2021-08-20 LAB — CBC WITH DIFFERENTIAL/PLATELET
Abs Immature Granulocytes: 0.49 10*3/uL — ABNORMAL HIGH (ref 0.00–0.07)
Basophils Absolute: 0.1 10*3/uL (ref 0.0–0.1)
Basophils Relative: 0 %
Eosinophils Absolute: 0 10*3/uL (ref 0.0–0.5)
Eosinophils Relative: 0 %
HCT: 34.4 % — ABNORMAL LOW (ref 39.0–52.0)
Hemoglobin: 11.5 g/dL — ABNORMAL LOW (ref 13.0–17.0)
Immature Granulocytes: 2 %
Lymphocytes Relative: 4 %
Lymphs Abs: 0.9 10*3/uL (ref 0.7–4.0)
MCH: 31 pg (ref 26.0–34.0)
MCHC: 33.4 g/dL (ref 30.0–36.0)
MCV: 92.7 fL (ref 80.0–100.0)
Monocytes Absolute: 0.7 10*3/uL (ref 0.1–1.0)
Monocytes Relative: 3 %
Neutro Abs: 24.4 10*3/uL — ABNORMAL HIGH (ref 1.7–7.7)
Neutrophils Relative %: 91 %
Platelets: 391 10*3/uL (ref 150–400)
RBC: 3.71 MIL/uL — ABNORMAL LOW (ref 4.22–5.81)
RDW: 13.2 % (ref 11.5–15.5)
WBC: 26.6 10*3/uL — ABNORMAL HIGH (ref 4.0–10.5)
nRBC: 0 % (ref 0.0–0.2)

## 2021-08-20 LAB — URINALYSIS, ROUTINE W REFLEX MICROSCOPIC
Bilirubin Urine: NEGATIVE
Glucose, UA: NEGATIVE mg/dL
Ketones, ur: NEGATIVE mg/dL
Leukocytes,Ua: NEGATIVE
Nitrite: NEGATIVE
Protein, ur: 30 mg/dL — AB
Specific Gravity, Urine: 1.023 (ref 1.005–1.030)
pH: 5 (ref 5.0–8.0)

## 2021-08-20 LAB — COMPREHENSIVE METABOLIC PANEL
ALT: 36 U/L (ref 0–44)
AST: 44 U/L — ABNORMAL HIGH (ref 15–41)
Albumin: 1.8 g/dL — ABNORMAL LOW (ref 3.5–5.0)
Alkaline Phosphatase: 95 U/L (ref 38–126)
Anion gap: 7 (ref 5–15)
BUN: 14 mg/dL (ref 8–23)
CO2: 29 mmol/L (ref 22–32)
Calcium: 8 mg/dL — ABNORMAL LOW (ref 8.9–10.3)
Chloride: 93 mmol/L — ABNORMAL LOW (ref 98–111)
Creatinine, Ser: 0.79 mg/dL (ref 0.61–1.24)
GFR, Estimated: >60 mL/min (ref >60)
Glucose, Bld: 119 mg/dL — ABNORMAL HIGH (ref 70–99)
Potassium: 4.1 mmol/L (ref 3.5–5.1)
Sodium: 129 mmol/L — ABNORMAL LOW (ref 135–145)
Total Bilirubin: 0.6 mg/dL (ref 0.3–1.2)
Total Protein: 5.2 g/dL — ABNORMAL LOW (ref 6.5–8.1)

## 2021-08-20 LAB — LACTIC ACID, PLASMA: Lactic Acid, Venous: 1 mmol/L (ref 0.5–1.9)

## 2021-08-20 LAB — CBG MONITORING, ED: Glucose-Capillary: 109 mg/dL — ABNORMAL HIGH (ref 70–99)

## 2021-08-20 LAB — SODIUM, URINE, RANDOM: Sodium, Ur: 33 mmol/L

## 2021-08-20 LAB — SARS CORONAVIRUS 2 (TAT 6-24 HRS): SARS Coronavirus 2: NEGATIVE

## 2021-08-20 LAB — MAGNESIUM: Magnesium: 2.2 mg/dL (ref 1.7–2.4)

## 2021-08-20 MED ORDER — ONDANSETRON HCL 4 MG PO TABS: 4.0000 mg | ORAL_TABLET | Freq: Four times a day (QID) | ORAL | Status: DC | PRN

## 2021-08-20 MED ORDER — COSYNTROPIN 0.25 MG IJ SOLR: 0.2500 mg | Freq: Once | INTRAMUSCULAR | Status: DC

## 2021-08-20 MED ORDER — POLYETHYLENE GLYCOL 3350 17 G PO PACK: 17.0000 g | PACK | Freq: Every day | ORAL | Status: DC | PRN

## 2021-08-20 MED ORDER — SODIUM CHLORIDE 0.9 % IV BOLUS
1000.0000 mL | Freq: Once | INTRAVENOUS | Status: AC
  Administered 2021-08-20: 1000 mL via INTRAVENOUS

## 2021-08-20 MED ORDER — MORPHINE SULFATE (PF) 2 MG/ML IV SOLN
1.0000 mg | INTRAVENOUS | Status: DC | PRN
  Administered 2021-08-20: 1 mg via INTRAVENOUS
  Filled 2021-08-20: qty 1

## 2021-08-20 MED ORDER — ONDANSETRON HCL 4 MG/2ML IJ SOLN
4.0000 mg | Freq: Once | INTRAMUSCULAR | Status: AC
  Administered 2021-08-20: 4 mg via INTRAVENOUS
  Filled 2021-08-20: qty 2

## 2021-08-20 MED ORDER — MOMETASONE FURO-FORMOTEROL FUM 100-5 MCG/ACT IN AERO
2.0000 | INHALATION_SPRAY | Freq: Two times a day (BID) | RESPIRATORY_TRACT | Status: DC
  Filled 2021-08-20: qty 8.8

## 2021-08-20 MED ORDER — IOHEXOL 350 MG/ML SOLN
60.0000 mL | Freq: Once | INTRAVENOUS | Status: AC | PRN
  Administered 2021-08-20: 60 mL via INTRAVENOUS

## 2021-08-20 MED ORDER — ACETAMINOPHEN 325 MG PO TABS: 650.0000 mg | ORAL_TABLET | Freq: Four times a day (QID) | ORAL | Status: DC | PRN

## 2021-08-20 MED ORDER — OXYCODONE HCL 5 MG PO TABS
5.0000 mg | ORAL_TABLET | ORAL | Status: DC | PRN
  Administered 2021-08-21 (×2): 5 mg via ORAL
  Filled 2021-08-20 (×2): qty 1

## 2021-08-20 MED ORDER — THIAMINE HCL 100 MG PO TABS
100.0000 mg | ORAL_TABLET | Freq: Every day | ORAL | Status: DC
  Administered 2021-08-20 – 2021-08-21 (×2): 100 mg via ORAL
  Filled 2021-08-20 (×2): qty 1

## 2021-08-20 MED ORDER — SODIUM CHLORIDE 0.9 % IV SOLN: 2.0000 g | INTRAVENOUS | Status: DC

## 2021-08-20 MED ORDER — DOXYCYCLINE HYCLATE 100 MG PO TABS
100.0000 mg | ORAL_TABLET | Freq: Two times a day (BID) | ORAL | Status: DC
  Administered 2021-08-20 – 2021-08-21 (×2): 100 mg via ORAL
  Filled 2021-08-20 (×2): qty 1

## 2021-08-20 MED ORDER — ENSURE ENLIVE PO LIQD
237.0000 mL | Freq: Two times a day (BID) | ORAL | Status: DC
  Administered 2021-08-21: 237 mL via ORAL
  Filled 2021-08-20: qty 237

## 2021-08-20 MED ORDER — PANTOPRAZOLE SODIUM 40 MG PO TBEC
40.0000 mg | DELAYED_RELEASE_TABLET | Freq: Every day | ORAL | Status: DC
  Administered 2021-08-20 – 2021-08-21 (×2): 40 mg via ORAL
  Filled 2021-08-20 (×2): qty 1

## 2021-08-20 MED ORDER — ENOXAPARIN SODIUM 40 MG/0.4ML IJ SOSY: 40.0000 mg | PREFILLED_SYRINGE | INTRAMUSCULAR | Status: DC

## 2021-08-20 MED ORDER — SUCRALFATE 1 GM/10ML PO SUSP: 1.0000 g | Freq: Three times a day (TID) | ORAL | Status: DC

## 2021-08-20 MED ORDER — ALBUTEROL SULFATE (2.5 MG/3ML) 0.083% IN NEBU
2.5000 mg | INHALATION_SOLUTION | RESPIRATORY_TRACT | Status: DC | PRN
  Administered 2021-08-20: 2.5 mg via RESPIRATORY_TRACT
  Filled 2021-08-20: qty 3

## 2021-08-20 MED ORDER — ACETAMINOPHEN 650 MG RE SUPP: 650.0000 mg | Freq: Four times a day (QID) | RECTAL | Status: DC | PRN

## 2021-08-20 MED ORDER — ENOXAPARIN SODIUM 40 MG/0.4ML IJ SOSY
40.0000 mg | PREFILLED_SYRINGE | INTRAMUSCULAR | Status: DC
  Administered 2021-08-20: 40 mg via SUBCUTANEOUS
  Filled 2021-08-20: qty 0.4

## 2021-08-20 MED ORDER — ONDANSETRON HCL 4 MG/2ML IJ SOLN: 4.0000 mg | Freq: Four times a day (QID) | INTRAMUSCULAR | Status: DC | PRN

## 2021-08-20 MED ORDER — SODIUM CHLORIDE 0.9 % IV SOLN
2.0000 g | Freq: Once | INTRAVENOUS | Status: AC
  Administered 2021-08-20: 2 g via INTRAVENOUS
  Filled 2021-08-20: qty 20

## 2021-08-20 MED ORDER — DEXTROSE-NACL 5-0.9 % IV SOLN: INTRAVENOUS | Status: DC

## 2021-08-20 NOTE — Progress Notes (Addendum)
FPTS Interim Progress Note  S: Went to see patient at the start of shift.  Found patient resting in bed with family member in the room.  He was mildly tachypneic but otherwise appeared comfortable.  He had no complaints or concerns other than he wanted a refill on his soda.  He was satting appropriate, 90-92% on room air with a history of COPD.  O: BP 115/74   Pulse 99   Temp 98.5 F (36.9 C) (Oral)   Resp (!) 21   Ht 6\' 2"  (1.88 m)   Wt 59.4 kg   SpO2 100%   BMI 16.81 kg/m   General: Cachectic appearing male, alert and oriented to person, place, time, situation in no apparent distress Heart: Regular rate though he has been tachycardic throughout the day, normal sounding rhythm with no murmurs Lungs: Crackles present bilaterally, worse on the right, mildly tachypneic, satting appropriate on room air Extremities: No lower extremity edema  A/P: 60 year old male presenting with weakness and nausea after starting chemotherapy for stage IV lung cancer.  We are currently monitoring his labs with a CMP pending at 8 PM as well as a TSH with his hyponatremia and concern for SIADH versus adrenal insufficiency versus hyponatremia due to poor intake.  He also has a CTA pending to rule out a PE due to his tachycardia which has been prominent throughout the day, though I suspect this may be due to poor intake and dehydration.  That being said with his malignancy the risk of blood clot is increased in this individual.  Patient also has a CT head pending as he states there were a few days that he could not remember.  He is alert and oriented to person, place, time, situation on my exam with no obvious focal deficits.  We will follow-up on the above labs and tests and keep a close watch on his vitals throughout the night.  Reassured that he is currently satting appropriately on room air.  Lurline Del, DO 08/20/2021, 8:21 PM PGY-3, [place] Service pager [phone]

## 2021-08-20 NOTE — Progress Notes (Addendum)
FMTS Attending Admission Note: Randall Singh, MD   For questions about this patient, please use [email or web address] to page the family medicine resident on call. Pager number [phone].    I  have seen and examined this patient, reviewed their chart. I have discussed this patient with the resident. Signed resident note and my additional findings below.   60 year old with history of stage IV squamous cell carcinoma of the lung, cerebral aneurysm, remote history of Streptococcus pneumonia bacteremia, tobacco abuse now in remission and reported asthma presenting with inability to tolerate oral intake.  Patient reports he feels like he has been hit by a truck.  He reports he underwent chemotherapy on Friday, August 26.  He felt okay for several days and really since that time feels like he has been hit by a truck.  He reports nausea and dysgeusia.  Food tastes like the ashes of tobacco he reports.  He endorses a persistent cough.  He additionally reports that for 3 days last week he cannot member what he was doing or where he was.  He reports Monday through Wednesday of last week was a "total blackout".  No one witnessed this as he lives with his mother with Durene Fruits dementia.  He does not think he had a seizure.  He is uncertain if he has fallen.  He does not particular recall those 3 days and feels like he went to sleep on Sunday and woke up on Thursday and cannot remember what happened.  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  The patient follows with oncology at [facility] for his squamous cell cancer.  He had a initial abnormality noted on CT in 2020 and was finally diagnosed in February 2022.  He first chose to pursue natural treatments.  He is currently on a PD-1 inhibitor, paclitaxel, and carboplatin. His tumor actually has low PD-1 expression.   PMH, PSH, Family history reviewed. Had recent endscopy for persistent abdominal pain and vomiting--showed erythematous gastric mucosa, biopsy showed  reactive gastropathy  Socially- lives with his mother. He has a friend IT consultant) staying with her. 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 He is currently making a new set of drums. Has cats at home. Enjoys playing music. Previously smoked (>30 pack year history). Drinks 1 beer or less per week he reports.   Labs- reviewed, notable for hyponatremia, leukocytosis,  mild anemia hypoalbuminemia, calcium corrects to normal TSH at start of PD-1 inhibitor 0.31  CXR reviewed---notable airspace disease in RLL  EKG- Sinus tachycardia with PRWP  CT head and CT for PE pending  Exam as below-  Skin has + healing eschar over superficial ulcer on R thumb base and middle digit (reports injury from tools used to make drums)   Acute hypoxemic respiratory failure, likely due to community acquired pneumonia, also consider pulmonary embolism. Less likely pneumonitis given symptoms but must consider in setting of PD-1 inhibitor. Would broaden to cefepime if febrile. Follow up MRSA swab. If clinically worsening, would expand to cover Pseudomonas. Add doxycycline 100 mg BID for additional atypical coverage. Previously had Streptococcus bacteremia (2020). Less likely cardiac etiology of symptoms  Hyponatremia, agree, likely hypovolemic, although many risk factors for SIADH. Recommend nutrition consult  Memory lapse, unclear cause of patient reported symptoms last week, some concern for fall, seizure given history of cancer and aneurysm. CT head without contrast ordered.  Stage IV SCC- Palliative Care consult. Patient has considerable uncontrolled pain, symptoms, and would benefit from  early palliative care involvement given his advanced cancer (metastasis to thoracic spine, abdominal nodes)  Remainder per resident note.

## 2021-08-20 NOTE — ED Notes (Signed)
Dr Chauncey Reading at bedside and placed pt on 4L 02 per [place]. Pt's Sa02 89% and Dr Chauncey Reading is fine with that.

## 2021-08-20 NOTE — H&P (Addendum)
[facility] Admission History and Physical Service Pager: [phone]  Patient name: Randall Valencia Medical record number: [id number] Date of birth: [birth year] Age: 60 y.o. Gender: male  Primary Care Provider: Patient, No Pcp Per (Inactive) Consultants: Palliative care, oncology Code Status: Full code Preferred Emergency Contact: Abrian Hanover, [phone]  Chief Complaint: Cachexia and Dehydration   Assessment and Plan: Kermitt Harjo is a 60 y.o. male presenting with cachexia and dehydration. PMH is significant for stage IV squamous cell carcinoma, brain aneurysm, COPD.   Acute Hypoxic Respiratory Failure  Leukocytosis in the setting of Stage IV Squamous Cell Carcinoma vs Pneumonia vs Steroid Use Patient had been diagnosed with squamous cell carcinoma via bronchoscopy in February of 2022 after he initially presented in February 2022 with SOB and hemoptysis. Follows Dr. Alcide Clever at [facility] and Dr Nori Riis Ready at [facility]. CT scan of chest and X ray of chest at that time showed left lung opacity. He had a PET scan on December 11, 2000 with symmetric hypermetabolic soft tissue prominence in central left lower lobe and left hilum with obstruction of the central left lower lobe bronchus and left lower lobe collapse. There was suspicion for metastatic disease given adenopathy throughout mediastinum, right hilum and upper abdominal retriperitoneum. T7 had hypermetabolic lytic bone lesion as well. He underwent bronchoscopy in February 2022 which showed neoplastic cells that stained P40 positive and supported squamous cell carcinoma. He had not been seen in again in clinic since February 2022 until June of this year and he had first chemo treatment on 08/03/2021 that was composed of carboplatin/paclitaxel/pembro. He reports that lapse in time between finding out the dx and starting treatment was for the physicians to get everything in order to  start treatment.  After his first chemotherapy appt, he started to feel terrible and has had poor oral intake and dehydration which is ultimately what caused him to go into the ED. In the ED, he was found to have leukocytosis to 26.6, but has remained afebrile. ED Labs/Imaging: Na 129, chloride 93, AST 44, ALT 36, Hgb 11.5. EKG showed NSR without acute ST changes, QTc of 442. Chest x-ray: Retrocardiac opacity may reflect known cancer. There is increased interstitial prominence particularly at the lung bases that probably reflects edema or pneumonia. UA was obtained due to leukocytosis which was negative for infection and patient has had no urinary symptoms of dysuria or frequency. In the room with the patient, he started satting in the 5s on room air, so we titrated O2 on nasal canula up to 4L to improve spO2 to upper 80s.   Ddx also includes leukocytosis related to malignancy which is possible given extensive disease, steroid use after chemotherapy, and pneumonia. PNA is a less likely dx as he has not been febrile and had no focal consolidations on the chest x ray besides the right sided malignancy. Pneumonitis is possibility given history of chemo tx with pembrolizumab, especially with risk factors of naive to chemo use, history of smoking, ahd chemo tx for non small cell lung cancer. Steroid use after chemotherapy seems to be too far out of the window of possibility to affect his WBC at this point. COPD exacerbation is also present on differential, and we are treating his COPD as noted below.  As previously mentioned urinary cause is unlikely as well. Most likely cause of this new leukocytosis would be 2/2 to malignancy, although patient did not receive GCFS after chemo tx. Additionally, current  chemotherapy regimen can cause some endocrine and adrenal abnormalities, so we will assess for this, see below.  In the ED, they gave the pt one dose of ceftriaxone, zofran, and two 1L boluses of NS. -Admit to [facility]  attending, Dr. Ardelia Mems attending -Admit to med-tele -Vital signs per floor protocol -Continuous pulse ox and cardiac monitoring  -Monitor fever curve -s/p 1x ceftriaxone, no fevers and mostly wheezing on examination can probably hold off on antibiotics for now -We will re-evaluate the need for abx tomorrow -consult to palliative to discuss end stage cancer, appreciate recommendations -f/u blood cultures and MRSA PCR -IVF-D5NS 75mL/hr -Monitor morning CBC/CMP. CMP and TSH tonight @2000  -Cosyntropin ordered  -CTAP to rule out PE -Zofran for nausea  -Dr. Chauncey Reading left message for Dr. Ready to discuss the patient, will await return call -Pain Control: Oxycodone 5mg  q4hr prn, Morphine 1mg  q4hr PRN, and Tylenol 650 mg q6hr prn  -Bowel regimen: Miralax  -Up with assistance, will add PT/OT tomorrow -Regular diet  Tachycardia  Pt has been tachycardic to the 120s and has this SOB and cough. Increased risk for PE given metastatic lung cancer. We will rule out PE. Also possibility of thyrotoxicosis after pembrolizumab, will obtain TSH at 2000. -CTAP ordered  -Monitor VS -lovenox dvt ppx -Cardiac monitoring -TSH 2000  Memory Loss  Pt reported that he cannot remember a few days from last week. He is unsure what happened in that time. Unable to gather corroborating evidence as patient lives with his mother who has alzheimer's dementia. Pt is alert and oriented x4 on exam.  -Will do head imaging CT noncon   Hyponatremia In ED, had sodium of 129 likely due to malnutrition, vomiting, and dehydration. Pt has a baseline in the upper 130s. There is a possibility of SIADH as cause of the patient's hyponatremia. However, this is the less likely diagnosis. Also, carboplatin, a part of the patient's chemotherapy regimen, can cause hyponatremia as a side effect. Patient received 2L of NS bolus in ED. -Repeat BMP 2000 -IVF D5NS @ 50 mL/hr -Daily BMPs -Zofran for nausea to prevent vomiting -Thiamine  PO -Urine sodium -Cosyntropin with cortisol AM  COPD Home medications include symbicort inhaler 2 puffs BID, Spiriva inhalation capsule daily, ipratropium-albuterol 1 puffs q6h. Unable to find previous PFTs in chart review, but likely that the patient does have COPD given his history, tobacco history, and findings on physical exam.  -keep O2 sats 88-92% -incentive spirometry  -albuterol nebulizers q2hr PRN  -Dulera 2 puffs 2xdaily ordered  -Due to elevated WBC-avoid steroids for now  -Continuous pulse oximetry   Severe Protein Calorie Malnutrition/ Dehydration  Reduced PO Intake  He has lost 60 pounds in the past 6 months. Takes Ensure Elive 237 mL 2 times between meals. The nausea and vomiting have contributed to lack of appetite, which is most likely due to chemotherapy. -RD consult -IVF D5NS 29ml/hr -Ensure ordered   Peptic Ulcer Disease Home medications include sucralafate 1 gram four times daily, omeprazole 20 mg BID. -Continue protonix 40 mg daily inpatient   FEN/GI: Regular diet Prophylaxis: Lovenox   Disposition: Home pending clinical improvement and barriers to discharge include additional medical work up  History of Present Illness:  Alias Villagran is a 60 y.o. male presenting with decreased PO intake, hyponatremia, and leukocytosis. PMH is significant for stage IV squamous cell carcinoma, brain aneurysm, COPD.   Patient reports that he had chemo on 8/26 and felt fine for 2-3 days and then started to feel poorly.  He has had nausea and vomiting intermittently, and has been unable to eat any solid food. Patient reports last oral intake was Sunday when he had 3 pieces of watermelon, no other food or drink. On Saturday he had 1 yoplait. Patient reports that he feels like "I've been in a car wreck." He has pleuritic chest pain, and has lots of coughing with pain all over his body.   Reports that he has urinary hesitancy related to his "prostate issues" that has been present for  many years.  Reports that he lives with his mother, who has AD. Patient reports that he quit smoking 3.5 yrs ago, before that smoked 2 ppd for approximately 40 years. He reports that he drinks 1-2 beers per week. He does not use any recreational drugs.  Review Of Systems: Per HPI with the following additions:   Review of Systems  Constitutional:  Positive for activity change, appetite change and fatigue. Negative for chills and fever.  Respiratory:  Positive for cough and wheezing. Negative for shortness of breath.   Cardiovascular:  Negative for chest pain and palpitations.  Gastrointestinal:  Positive for nausea and vomiting. Negative for abdominal pain, constipation and diarrhea.  Genitourinary:  Positive for difficulty urinating.    Patient Active Problem List   Diagnosis Date Noted   Dehydration, severe 08/20/2021   Stage IV squamous cell carcinoma of left lung ([place]) 05/29/2021   Bacteremia due to Streptococcus pneumoniae    Bronchospasm, acute 05/12/2019   Tobacco use 05/12/2019   CAP (community acquired pneumonia) 05/11/2019   Hyponatremia 05/11/2019   Severe protein-calorie malnutrition ([place]) 05/11/2019    Past Medical History: Past Medical History:  Diagnosis Date   Asthma    Cerebral aneurysm    Lung cancer ([place])    PUD (peptic ulcer disease)     Past Surgical History: History reviewed. No pertinent surgical history.  Social History: Social History   Tobacco Use   Smoking status: Former    Types: Cigarettes   Smokeless tobacco: Never  Vaping Use   Vaping Use: Never used  Substance Use Topics   Alcohol use: Not Currently   Drug use: Not Currently   Additional social history: Lives with mother who has Alzheimer's.  Please also refer to relevant sections of EMR.  Family History: Family History  Problem Relation Age of Onset   Alzheimer's disease Mother    Schizophrenia Father    Suicidality Father    Allergies and Medications: No Known  Allergies Current Facility-Administered Medications on File Prior to Encounter  Medication Dose Route Frequency Provider Last Rate Last Admin   0.9 %  sodium chloride infusion  500 mL Intravenous Once Daryel November, MD       Current Outpatient Medications on File Prior to Encounter  Medication Sig Dispense Refill   Ascorbic Acid (VITAMIN C) 1000 MG tablet Take 1,000 mg by mouth daily.     budesonide-formoterol (SYMBICORT) 80-4.5 MCG/ACT inhaler Inhale 2 puffs into the lungs 2 (two) times daily. 1 Inhaler 12   feeding supplement, ENSURE ENLIVE, (ENSURE ENLIVE) LIQD Take 237 mLs by mouth 2 (two) times daily between meals. 237 mL 12   oxyCODONE-acetaminophen (PERCOCET) 10-325 MG tablet Take 1 tablet by mouth every 6 (six) hours as needed for pain. 20 tablet 0   AMBULATORY NON FORMULARY MEDICATION GI cocktail :90 ml -viscous lidocaine, 90 ml-10 mg/62ml dicyclomine ,270 ml maalox.  Take 10 ml every four hours. (Patient not taking: Reported on 06/21/2021) 450 mL 0  Ipratropium-Albuterol (COMBIVENT RESPIMAT) 20-100 MCG/ACT AERS respimat Inhale 1 puff into the lungs every 6 (six) hours as needed for up to 30 days for wheezing. 1 Inhaler 0   omeprazole (PRILOSEC) 20 MG capsule Take 1 capsule (20 mg total) by mouth 2 (two) times daily. (Patient not taking: Reported on 08/20/2021) 60 capsule 3   sucralfate (CARAFATE) 1 GM/10ML suspension Take 10 mLs (1 g total) by mouth 4 (four) times daily. (Patient not taking: Reported on 08/20/2021) 420 mL 1   tiotropium (SPIRIVA HANDIHALER) 18 MCG inhalation capsule Place 1 capsule (18 mcg total) into inhaler and inhale daily. 30 capsule 2    Objective: BP 115/74   Pulse 99   Temp 98.5 F (36.9 C) (Oral)   Resp (!) 21   Ht 6\' 2"  (1.88 m)   Wt 59.4 kg   SpO2 100%   BMI 16.81 kg/m  Physical Exam Vitals reviewed.  Constitutional:      General: He is in acute distress.     Appearance: He is ill-appearing.     Comments: Frail and cachectic man resting in  bed.  HENT:     Mouth/Throat:     Mouth: Mucous membranes are dry.  Cardiovascular:     Rate and Rhythm: Regular rhythm. Tachycardia present.     Pulses: Normal pulses.     Heart sounds: Normal heart sounds. No murmur heard.   No gallop.  Pulmonary:     Effort: Respiratory distress present.     Comments: Poor air movement with inspiratory wheezes >expiratory wheezes. No crackles.  Abdominal:     General: Bowel sounds are normal. There is no distension.     Palpations: Abdomen is soft.     Tenderness: There is no abdominal tenderness.  Skin:    General: Skin is warm and dry.  Neurological:     Mental Status: He is alert. Mental status is at baseline.     Comments: AxOx4  Psychiatric:        Mood and Affect: Mood normal.        Behavior: Behavior normal.        Thought Content: Thought content normal.        Judgment: Judgment normal.     Labs and Imaging: CBC BMET  Recent Labs  Lab 08/20/21 1145  WBC 26.6*  HGB 11.5*  HCT 34.4*  PLT 391   Recent Labs  Lab 08/20/21 1145  NA 129*  K 4.1  CL 93*  CO2 29  BUN 14  CREATININE 0.79  GLUCOSE 119*  CALCIUM 8.0*     EKG: My own interpretation (not copied from electronic read) NSR without acute ST segment changes. Qtc 442.    Chest X-ray: Retrocardiac opacity may reflect known cancer. There is increased interstitial prominence particularly at the lung bases that probably reflects edema or pneumonia. UA was obtained due to leukocytosis which was negative for infection and patient has had no urinary symptoms of dysuria or frequency.  Erskine Emery, MD 08/20/2021, 7:05 PM PGY-1, [place] Intern pager: [phone], text pages welcome   FPTS Upper-Level Resident Addendum   I have independently interviewed and examined the patient. I have discussed the above with the original author and agree with their documentation. I have added edits as needed. Please see also any attending notes.   Gladys Damme, M.D. PGY-2, [place] Medicine 08/20/2021 7:46 PM  [place] Service pager: [phone] (text pages welcome through [place])

## 2021-08-20 NOTE — Progress Notes (Signed)
Received page from radiology that the CT head showed findings consistent with hemorrhagic metastasis.  Unsure if these are new bleeds or chronic per the radiologist and he recommended an MRI brain with and without contrast for further evaluation.  We will hold the patient's Lovenox for the time being while we further evaluate this.  MRI brain with and without contrast ordered.

## 2021-08-20 NOTE — ED Notes (Signed)
Report received from [place], [place]

## 2021-08-20 NOTE — ED Provider Notes (Signed)
Medical screening examination/treatment/procedure(s) were conducted as a shared visit with non-physician practitioner(s) and myself.  I personally evaluated the patient during the encounter.  Clinical Impression:   Final diagnoses:  None    Pt with hx of recent dx of Lung CA - presents - with n/v/d since taking first dose of chemotherapy within the last month.  Reports that he has had several days of failure to thrive with very little oral intake, called his oncologist today who told him to come to the emergency department to be evaluated due to dehydration.  On exam the patient has some dry mucous membranes, very poor dentition at baseline, he has no tachycardia, lungs have some rhonchi but otherwise clear and speaking in full sentences.  He has severe cachexia of his entire body and muscle wasting, he is awake alert and able answer questions.  Vital signs reflect a borderline tachycardia, borderline hypotension, he definitely need some IV fluids.  Labs pending, determine disposition after laboratory work-up and response to therapy with antiemetics and fluids     Noemi Chapel, MD 08/24/21 [phone]

## 2021-08-20 NOTE — ED Triage Notes (Signed)
Pt arrived via [facility] for dehydration and not eatingx1 mo. Pt is cachetic. Pt is A&Ox4. VSS.

## 2021-08-20 NOTE — ED Provider Notes (Signed)
[facility] EMERGENCY DEPARTMENT Provider Note   CSN: [id number] Arrival date & time: 08/20/21  1124     History Chief Complaint  Patient presents with   No oral intake of food and dehydration    Randall Valencia is a 60 y.o. male.  HPI  Patient with partial carcinoma to the lungs with mets with multiple days of poor oral intake and dehydration.  Patient had his first chemo treatment 08/03/2021.  Patient reports feeling terrible since then, feels as if he was hit by a truck.  Last few days he has been nauseated and unable to tolerate eating solid foods.  He has been able to tolerate drinking water.  Denies any pain anywhere, he has been having some episodes of diarrhea when he is able to eat oral food.  No back pain.  No chest pain.  Past Medical History:  Diagnosis Date   Asthma    Cerebral aneurysm    Lung cancer ([place])    PUD (peptic ulcer disease)     Patient Active Problem List   Diagnosis Date Noted   Stage IV squamous cell carcinoma of left lung ([place]) 05/29/2021   Bacteremia due to Streptococcus pneumoniae    Bronchospasm, acute 05/12/2019   Tobacco use 05/12/2019   CAP (community acquired pneumonia) 05/11/2019   Hyponatremia 05/11/2019   Severe protein-calorie malnutrition ([place]) 05/11/2019    History reviewed. No pertinent surgical history.     Family History  Problem Relation Age of Onset   Alzheimer's disease Mother    Schizophrenia Father    Suicidality Father     Social History   Tobacco Use   Smoking status: Former    Types: Cigarettes   Smokeless tobacco: Never  Vaping Use   Vaping Use: Never used  Substance Use Topics   Alcohol use: Not Currently   Drug use: Not Currently    Home Medications Prior to Admission medications   Medication Sig Start Date End Date Taking? Authorizing Provider  AMBULATORY NON FORMULARY MEDICATION GI cocktail :90 ml -viscous lidocaine, 90 ml-10 mg/63ml dicyclomine ,270 ml maalox.  Take 10 ml every  four hours. Patient not taking: Reported on 06/21/2021 06/15/21   Daryel November, MD  budesonide-formoterol [facility]) 80-4.5 MCG/ACT inhaler Inhale 2 puffs into the lungs 2 (two) times daily. 05/17/19   Bonnell Public, MD  feeding supplement, ENSURE ENLIVE, (ENSURE ENLIVE) LIQD Take 237 mLs by mouth 2 (two) times daily between meals. 05/17/19   Bonnell Public, MD  Ipratropium-Albuterol (COMBIVENT RESPIMAT) 20-100 MCG/ACT AERS respimat Inhale 1 puff into the lungs every 6 (six) hours as needed for up to 30 days for wheezing. 05/17/19 06/15/21  Bonnell Public, MD  omeprazole (PRILOSEC) 20 MG capsule Take 1 capsule (20 mg total) by mouth 2 (two) times daily. 06/15/21   Daryel November, MD  oxyCODONE-acetaminophen (PERCOCET) 10-325 MG tablet Take 1 tablet by mouth every 6 (six) hours as needed for pain. Patient not taking: Reported on 06/21/2021 06/15/21   Daryel November, MD  sucralfate (CARAFATE) 1 GM/10ML suspension Take 10 mLs (1 g total) by mouth 4 (four) times daily. 06/15/21   Daryel November, MD  tiotropium (SPIRIVA HANDIHALER) 18 MCG inhalation capsule Place 1 capsule (18 mcg total) into inhaler and inhale daily. 05/17/19 06/15/21  Bonnell Public, MD    Allergies    Patient has no known allergies.  Review of Systems   Review of Systems  Constitutional:  Positive for appetite change  and fatigue. Negative for fever.  Respiratory:  Negative for shortness of breath.   Cardiovascular:  Negative for chest pain.  Gastrointestinal:  Positive for diarrhea and nausea. Negative for abdominal pain and vomiting.  Musculoskeletal:  Positive for myalgias.  Neurological:  Positive for weakness. Negative for numbness and headaches.   Physical Exam Updated Vital Signs BP 104/74   Pulse 86   Temp 98.5 F (36.9 C) (Oral)   Resp 20   Ht 6\' 2"  (1.88 m)   Wt 59.4 kg   SpO2 92%   BMI 16.81 kg/m   Physical Exam Vitals and nursing note reviewed. Exam conducted with a chaperone  present.  Constitutional:      Appearance: Normal appearance.     Comments: Patient appears cachectic, muscle wasting present  HENT:     Head: Normocephalic and atraumatic.  Eyes:     General: No scleral icterus.       Right eye: No discharge.        Left eye: No discharge.     Extraocular Movements: Extraocular movements intact.     Pupils: Pupils are equal, round, and reactive to light.  Cardiovascular:     Rate and Rhythm: Regular rhythm. Tachycardia present.     Pulses: Normal pulses.     Heart sounds: Normal heart sounds. No murmur heard.   No friction rub. No gallop.  Pulmonary:     Effort: Pulmonary effort is normal. No respiratory distress.     Breath sounds: Normal breath sounds.  Abdominal:     General: Abdomen is flat. Bowel sounds are normal. There is no distension.     Palpations: Abdomen is soft.     Tenderness: There is no abdominal tenderness.  Musculoskeletal:     Comments: Diffuse muscle wasting, no edema  Skin:    General: Skin is warm and dry.     Coloration: Skin is not jaundiced.  Neurological:     Mental Status: He is alert. Mental status is at baseline.     Coordination: Coordination normal.    ED Results / Procedures / Treatments   Labs (all labs ordered are listed, but only abnormal results are displayed) Labs Reviewed  CBG MONITORING, ED - Abnormal; Notable for the following components:      Result Value   Glucose-Capillary 109 (*)    All other components within normal limits    EKG None  Radiology No results found.  Procedures Procedures   Medications Ordered in ED Medications  sodium chloride 0.9 % bolus 1,000 mL (has no administration in time range)  ondansetron (ZOFRAN) injection 4 mg (has no administration in time range)    ED Course  I have reviewed the triage vital signs and the nursing notes.  Pertinent labs & imaging results that were available during my care of the patient were reviewed by me and considered in my  medical decision making (see chart for details).    MDM Rules/Calculators/A&P                           Patient denies returning to the hospital to get a Neupogen shot.  Patient has leukocytosis of unknown origin.  Urine is still pending.  Chest x-ray shows pulmonary edema versus pneumonia.  Patient is not tolerating oral intake, still feeling nauseated despite shot of Zofran.  He is hyponatremic, plus has had major leukocytosis.  We will plan to admit patient for IV fluids and nausea control.  Patient given a gram of Rocephin here in the ED setting.  Spoke with hospitalist service and they will admit the patient.  Final Clinical Impression(s) / ED Diagnoses Final diagnoses:  None    Rx / DC Orders ED Discharge Orders     None        Sherrill Raring, PA-C 08/20/21 1457    Noemi Chapel, MD 08/24/21 [phone]

## 2021-08-21 ENCOUNTER — Observation Stay (HOSPITAL_COMMUNITY): Payer: Medicaid Other

## 2021-08-21 ENCOUNTER — Other Ambulatory Visit: Payer: Self-pay | Admitting: Radiation Therapy

## 2021-08-21 DIAGNOSIS — C3490 Malignant neoplasm of unspecified part of unspecified bronchus or lung: Secondary | ICD-10-CM

## 2021-08-21 DIAGNOSIS — E8809 Other disorders of plasma-protein metabolism, not elsewhere classified: Secondary | ICD-10-CM | POA: Insufficient documentation

## 2021-08-21 DIAGNOSIS — R112 Nausea with vomiting, unspecified: Secondary | ICD-10-CM | POA: Insufficient documentation

## 2021-08-21 DIAGNOSIS — J189 Pneumonia, unspecified organism: Secondary | ICD-10-CM

## 2021-08-21 DIAGNOSIS — E860 Dehydration: Secondary | ICD-10-CM | POA: Diagnosis not present

## 2021-08-21 DIAGNOSIS — E871 Hypo-osmolality and hyponatremia: Secondary | ICD-10-CM | POA: Diagnosis not present

## 2021-08-21 DIAGNOSIS — J9601 Acute respiratory failure with hypoxia: Secondary | ICD-10-CM | POA: Diagnosis not present

## 2021-08-21 DIAGNOSIS — Z515 Encounter for palliative care: Secondary | ICD-10-CM

## 2021-08-21 DIAGNOSIS — E43 Unspecified severe protein-calorie malnutrition: Secondary | ICD-10-CM

## 2021-08-21 DIAGNOSIS — C3492 Malignant neoplasm of unspecified part of left bronchus or lung: Secondary | ICD-10-CM

## 2021-08-21 LAB — CBC
HCT: 34.7 % — ABNORMAL LOW (ref 39.0–52.0)
Hemoglobin: 11.1 g/dL — ABNORMAL LOW (ref 13.0–17.0)
MCH: 30.6 pg (ref 26.0–34.0)
MCHC: 32 g/dL (ref 30.0–36.0)
MCV: 95.6 fL (ref 80.0–100.0)
Platelets: 381 10*3/uL (ref 150–400)
RBC: 3.63 MIL/uL — ABNORMAL LOW (ref 4.22–5.81)
RDW: 13.5 % (ref 11.5–15.5)
WBC: 26.3 10*3/uL — ABNORMAL HIGH (ref 4.0–10.5)
nRBC: 0 % (ref 0.0–0.2)

## 2021-08-21 LAB — COMPREHENSIVE METABOLIC PANEL
ALT: 28 U/L (ref 0–44)
ALT: 31 U/L (ref 0–44)
AST: 37 U/L (ref 15–41)
AST: 43 U/L — ABNORMAL HIGH (ref 15–41)
Albumin: 1.6 g/dL — ABNORMAL LOW (ref 3.5–5.0)
Albumin: 1.5 g/dL — ABNORMAL LOW (ref 3.5–5.0)
Alkaline Phosphatase: 87 U/L (ref 38–126)
Alkaline Phosphatase: 86 U/L (ref 38–126)
Anion gap: 9 (ref 5–15)
Anion gap: 9 (ref 5–15)
BUN: 11 mg/dL (ref 8–23)
BUN: 9 mg/dL (ref 8–23)
CO2: 25 mmol/L (ref 22–32)
CO2: 25 mmol/L (ref 22–32)
Calcium: 7.3 mg/dL — ABNORMAL LOW (ref 8.9–10.3)
Calcium: 7.3 mg/dL — ABNORMAL LOW (ref 8.9–10.3)
Chloride: 94 mmol/L — ABNORMAL LOW (ref 98–111)
Chloride: 98 mmol/L (ref 98–111)
Creatinine, Ser: 0.76 mg/dL (ref 0.61–1.24)
Creatinine, Ser: 0.64 mg/dL (ref 0.61–1.24)
GFR, Estimated: >60 mL/min (ref >60)
GFR, Estimated: >60 mL/min (ref >60)
Glucose, Bld: 194 mg/dL — ABNORMAL HIGH (ref 70–99)
Glucose, Bld: 97 mg/dL (ref 70–99)
Potassium: 4 mmol/L (ref 3.5–5.1)
Potassium: 3.8 mmol/L (ref 3.5–5.1)
Sodium: 128 mmol/L — ABNORMAL LOW (ref 135–145)
Sodium: 132 mmol/L — ABNORMAL LOW (ref 135–145)
Total Bilirubin: 0.6 mg/dL (ref 0.3–1.2)
Total Bilirubin: 0.5 mg/dL (ref 0.3–1.2)
Total Protein: 4.5 g/dL — ABNORMAL LOW (ref 6.5–8.1)
Total Protein: 4.6 g/dL — ABNORMAL LOW (ref 6.5–8.1)

## 2021-08-21 LAB — HIV ANTIBODY (ROUTINE TESTING W REFLEX): HIV Screen 4th Generation wRfx: NONREACTIVE

## 2021-08-21 LAB — TSH: TSH: 3.183 u[IU]/mL (ref 0.350–4.500)

## 2021-08-21 LAB — MRSA NEXT GEN BY PCR, NASAL: MRSA by PCR Next Gen: NOT DETECTED

## 2021-08-21 NOTE — Progress Notes (Signed)
Received page from RN that patient had stated he wanted to leave AMA. I went down to discuss with patient. Patient was alert and oriented x4. He stated, "I just came up here to get fluids and now I can eat. I don't need all this, I don't need oxygen." Patient has been persistenly hypoxic to the low 80s and 70s, requiring 4L [place] of O2 supplementation to remain between SpO2 88-92%. I discussed that without oxygen, he could pass away faster due to respiratory distress. Patient was able to verbalize that he acknowledged this risk and was willing to accept that outcome. I offered to try to arrange home oxygen, but patient declined. I also discussed that because he presented with severe dehydration and had not been able to eat or drink anything for the prior 3 days to presentation, that going home he could easily wind up in the same situation. Patient stated that he acknowledged that this could recur, but that he did not think it would and he still preferred to go home. When I offered him zofran, he stated he had this at home and declined the offer. When asked what he would do in the event that he was unable to eat at home, patient stated he would return to the ED. I also discussed that we are treating him for a possible pneumonia and that it could become worse without antibiotics. I offered him antibiotic prescription and he declined. He acknowledged that infection could become worse without abx, but he does not believe he has one. He said that he would prefer his imaging to be arranged outpatient, which can be ordered by his oncologist at [facility]. He stated he did not want to wait for oncology here at [facility] to see him and he preferred to follow up with his oncologist at [facility]. I offered to have him speak with the palliative team before leaving, he reported that he has an upcoming appointment with outpatient palliative. After counseling, patient confirmed he would prefer to leave AMA. In my medical opinion, patient does  have the capacity to make this decision. I notified attending and RN of patient's desires.  Gladys Damme, MD [facility], PGY-3

## 2021-08-21 NOTE — Progress Notes (Addendum)
FMTS Attending Brief Note In to see patient this AM. On my exam walking into room oxygen is on floor, IV unhooked. Vitals 77% on RA HR 92, RR 18. Reports he feels okay but could not tolerate MRI due to dyspnea. Placed on 3L oxygen with improvement in oxygen saturation and dyspnea. He reports he is hungry. He is drinking well. He reports he did not get a dinner tray, reports he is unhappy about this.  Reviewed orders, goal O2 sat 88-92%. Has had regular diet ordered since 1704 on 9/12.  Discussed how to use call bell with patient. Discussed with nursing, appreciate their care of this patient and discussed oxygen goal, IV fluids, and diet status.  Discussed with patient as well.  Dorris Singh, MD  Family Medicine Teaching Service

## 2021-08-21 NOTE — ED Notes (Signed)
Pt called, RN attended to pt. Pt insisted/rudely on getting disconnected from monitor, IVF. States "If you do not disconnect this IV, I will! I need to be out of this bed. If they don't have a timeframe on when the other tests are going to be done, I will go home and you can call me when yall are ready". Pt education done, pt insists that he would like to leave. Asked patient if he wants to leave AMA, pt states "Yes, whatever you got to do". Secured chat Dr. Ardelia Mems, waiting for response.

## 2021-08-21 NOTE — Progress Notes (Signed)
Family Medicine Teaching Service Daily Progress Note Intern Pager: [phone]  Patient name: Randall Valencia Medical record number: [id number] Date of birth: 07-17-1961 Age: 60 y.o. Gender: male  Primary Care Provider: Patient, No Pcp Per (Inactive) Consultants: Palliative, oncology  Code Status: Full   Pt Overview and Major Events to Date:  9/12 Admitted to FPTS  Assessment and Plan:  Randall Valencia is a 60 yo male presenting with cachexia and dehydration. PMHx is significant for stage IV squamous cell carcinoma (carboplatin/pembro/paclitaxel for chemotherapy x1), brain aneurysm, COPD.   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  MRI of the head was truncated due to difficulty breathing with no postcontrast imaging. Due to his hemorrhagic mets, we are holding his lovenox. Pt reports that he is feeling better today and notes that he does not feel like wearing his oxygen. I emphasized that there was importance to wearing his oxygen if his spO2 decreases below 88%. WBC 26.6>26.3, corrected calcium 9, albumin 1.5, hgb 11,1 and stable, MRSA negative, blood cultures no growth currently. Dr. Chauncey Reading spoke to Dr. Cyndee Brightly who is on call for his oncologist Dr. Ready, and he recommended to bring in radiation oncology for possible radiation. Dr. Chauncey Reading also called  oncology here at [facility], they are aware and will see the pt. Pt has possible CAP as seen on imaging. We are covering with abx as mentioned below.  -Follow up with oncology at [facility] -Follow up with Rad onc  -Monitor WBC with regular CBC -Will need post contrast imaging  -Continue with abx coverage ceftriaxone and doxy -Pain control: Oxycodone 5mg  q4hr PRN, morphine 1mg  q4hr PRN, and Tylenol 650 mg q6hr PRN -Bowel regimen: Miralax -PT/OT eval and treat  -regular diet  -IVF-D5NS 34mL/hr -F/u blood cultures   -Monitor fever curve  -Await palliative consultation  -Holding Lovenox   Tachycardia and tachypnea  Patient's tachycardia resolved overnight.  Most recent heart rates have been in the 70s.  He has remained tachypneic however with respiratory rates up to 31.  CTAP is negative for PE but shows metastasis.  TSH is within normal limits at 3.183. patient is satting in the upper 90s on room air.  He reports that his breathing has improved a small amount. He still has the cough.  -Vital signs per floor protocol -Holding Lovenox given hemorrhagic mets -Cardiac monitoring  Memory Loss Today patient is alert and oriented x4 with no obvious neurological deficits. 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 There are three days of time that he cannot account for, he has not had memory loss since then. I believe the patient could tolerate a postcontrast MRI at this point.  -Repeat postcontrast imaging   Hyponatremia  Patient has been hyponatremic since admission and has been seen stable from 129 > 128>132. As mentioned in previous notes patient has many risk factors for SIADH and adrenal insufficiency.  Additionally his chemotherapy regimen could be contributing to his hyponatremia.  Most likely etiology is composed of history of vomiting and malnutrition.  We have been giving him IV fluids and there is no sign of overt volume overload on exam today. Urine sodium 33, this leads to believe that the hyponatremia is truly hypovolemic.  -Monitor with BMPs -IVF D5 normal saline running at 50 mL/HR -Zofran for nausea -P.O. thiamine -Awaiting cosyntropin and AM cortisol  COPD  Home medications include Symbicort inhaler 2 puffs twice daily, Spiriva inhalation capsule daily, ipratropium/albuterol 1 puff every 6 hours.  No PFTs in chart review but there is high likelihood that he has COPD over asthma which is what the patient reports that he  has. -Keep O2 sats 88 to 92% - Incentive spirometry - Albuterol nebulizers every 2 hours as needed - Dulera 2 puffs 2 times daily - Avoiding steroids for now due to elevated white blood cell count - Continuous pulse ox  Severe Protein Calorie Malnutrition/Dehydration Reduced PO Intake  As previously mentioned patient has lost an excessive amount of weight about 60 pounds in the past 6 months likely secondary to his cancer.  Normally he takes Ensure Elive 237 mL 2 times between meals.  -Nutrition consult -IVF D5 normal saline 50 mL/hour -Ensure ordered  Peptic ulcer disease Home medications include sucralfate 1 g 4 times daily, omeprazole 20 mg twice daily - Continue Protonix 40 mg daily outpatient  During rounds, he told his nurse that he wanted to leave AMA. Dr. Chauncey Reading went to see the patient and evaluated for capacity. Pt is leaving AMA and has capacity for decision making, fully alert and oriented.   Subjective:  Pt reports that he feels a little bit better. He received and began to eat his breakfast while in the room. Patient denies any nausea, vomiting, or diarrhea. Otherwise, he still has pain all over his body and feels as if he's been hit by a truck.   Objective: Temp:  [98.2 F (36.8 C)-98.5 F (36.9 C)] 98.2 F (36.8 C) (09/13 0730) Pulse Rate:  [70-119] 74 (09/13 0730) Resp:  [14-31] 23 (09/13 0730) BP: (104-147)/(63-93) 105/63 (09/13 0730) SpO2:  [77 %-100 %] 94 % (09/13 0730) Weight:  [59.4 kg] 59.4 kg (09/12 1134) Physical Exam Vitals reviewed.  Constitutional:      Appearance: He is ill-appearing.     Comments: Malnourished and cachetic older man resting in bed  Cardiovascular:     Rate and Rhythm: Normal rate and regular rhythm.     Pulses: Normal pulses.     Heart sounds: No murmur heard.   No gallop.  Pulmonary:     Comments: Increased WOB, wheezing present. Faint crackles noted.  Abdominal:     General: Bowel sounds are normal. There is no  distension.     Palpations: Abdomen is soft.     Tenderness: There is no abdominal tenderness.  Skin:    General: Skin is warm.  Neurological:     Mental Status: He is alert and oriented to person, place, and time.  Psychiatric:        Mood and Affect: Mood normal.        Behavior: Behavior normal.     Laboratory: Recent Labs  Lab 08/20/21 1145 08/21/21 0631  WBC 26.6* 26.3*  HGB 11.5* 11.1*  HCT 34.4* 34.7*  PLT 391 381   Recent Labs  Lab 08/20/21 1145 08/20/21 2000 08/21/21 0631  NA 129* 128* 132*  K 4.1 4.0 3.8  CL 93* 94* 98  CO2 29 25 25   BUN 14 11 9   CREATININE 0.79 0.76 0.64  CALCIUM 8.0* 7.3* 7.3*  PROT 5.2* 4.5* 4.6*  BILITOT 0.6 0.6 0.5  ALKPHOS 95 87 86  ALT 36 28 31  AST 44* 37 43*  GLUCOSE 119* 194* 97    Imaging/Diagnostic Tests:  CTAP 08/20/2021: Chronic masslike consolidation in the LLL, possibly reflecting radiation changes with underlying tumor.  Small left pleural effusion.  Patchy right lower lobe opacity that is new favoring pneumonia over atelectasis with trace right pleural effusion.  4.9 cm lytic small soft tissue metastasis involving the left T7 vertebral body with near complete effacement of the spinal canal at this level.  A 10 mm low-density lesion in the right hepatic dome stable versus mildly increased.  CT head without contrast 08/20/2021: Intraparenchymal lesions of the left caudate head and superior right frontal lobe with associated small volume blood products most consistent with hemorrhagic metastases.  No significant edema or midline shift  MRI head 08/21/2021: Left caudate and right occipital hemorrhages remain primarily concerning for hemorrhagic mets but study was truncated postcontrast imaging were not required due to patient difficulty with breathing.  Erskine Emery, MD 08/21/2021, 10:36 AM PGY-1, [place] Intern pager: [phone], text pages welcome

## 2021-08-21 NOTE — ED Notes (Signed)
Spoke with Dr. Marcina Millard, informed him that pt wants to leave AMA. Per MD, they will send a provider to talk to patient.

## 2021-08-21 NOTE — Progress Notes (Signed)
Brief Palliative Medicine Progress Note:  PMT consult received and chart reviewed.   Per RN note, patient was wanting to leave AMA. Reached out to RN who states patient has just left after speaking with MD.   Unfortunately, was unable to complete PMT consult.  Thank you for allowing PMT to assist in the care of this patient.  Tiona Ruane M. Tamala Julian [facility] Palliative Medicine Team Team Phone: [phone] NO CHARGE

## 2021-08-21 NOTE — ED Notes (Signed)
Pt states [place] friend will come pick him up now.

## 2021-08-25 LAB — CULTURE, BLOOD (ROUTINE X 2)
Culture: NO GROWTH
Culture: NO GROWTH
Special Requests: ADEQUATE

## 2021-08-27 ENCOUNTER — Inpatient Hospital Stay: Payer: Medicaid Other | Attending: Radiation Oncology

## 2021-08-28 ENCOUNTER — Telehealth: Payer: Self-pay

## 2021-08-28 NOTE — Telephone Encounter (Signed)
After discussing the patient in brain conference on 9/19, I called the patient to discuss next steps. I stated we received his imaging and would like him to meet with our radiation oncologist to discuss radiation treatments. The patient stated he wants to continue treatment with oncologist at [facility]. I stressed the importance of meeting with his oncologist to discuss next steps for treatment based on the imaging reviewed in brain conference. He reassured me that he was being taken care by [facility] at this time.   Montasia Chisenhall R.T.(T)

## 2022-01-22 ENCOUNTER — Encounter: Payer: Self-pay | Admitting: Internal Medicine

## 2022-01-23 ENCOUNTER — Telehealth: Payer: Self-pay | Admitting: Gastroenterology

## 2022-01-23 NOTE — Telephone Encounter (Signed)
Inbound call from patient stating he has not had a bowel movement for the past 5 days.  Please advise.

## 2022-01-23 NOTE — Telephone Encounter (Signed)
Pt reports he has not had a BM in about 4-5 days. Reports he has tried miralax and some enemas but nothing helped. Pt requesting an appt. Pt scheduled to see Ellouise Newer PA tomorrow at 3pm. Pt aware of appt.

## 2022-01-24 ENCOUNTER — Ambulatory Visit: Payer: Medicaid Other | Admitting: Physician Assistant

## 2022-01-25 ENCOUNTER — Encounter: Payer: Self-pay | Admitting: Physician Assistant

## 2022-01-25 ENCOUNTER — Ambulatory Visit (INDEPENDENT_AMBULATORY_CARE_PROVIDER_SITE_OTHER): Payer: Medicaid Other | Admitting: Physician Assistant

## 2022-01-25 VITALS — BP 116/60 | HR 70 | Ht 74.0 in | Wt 127.0 lb

## 2022-01-25 DIAGNOSIS — K5900 Constipation, unspecified: Secondary | ICD-10-CM

## 2022-01-25 DIAGNOSIS — R1013 Epigastric pain: Secondary | ICD-10-CM

## 2022-01-25 DIAGNOSIS — C3492 Malignant neoplasm of unspecified part of left bronchus or lung: Secondary | ICD-10-CM | POA: Diagnosis not present

## 2022-01-25 MED ORDER — OMEPRAZOLE 40 MG PO CPDR: 40.0000 mg | DELAYED_RELEASE_CAPSULE | Freq: Every day | ORAL | 5 refills | Status: DC

## 2022-01-25 NOTE — Patient Instructions (Addendum)
We have sent the following medications to your pharmacy for you to pick up at your convenience: Omeprazole 40 mg daily.  GI Cocktail 5-10 ml as needed.    Start Miralax 1 capful daily in 8 ounces of liquid.  Follow up with your oncologist.

## 2022-01-25 NOTE — Progress Notes (Signed)
Chief Complaint: Constipation  HPI:    [name] is a 61 year old male, known to Dr. Candis Schatz, with a past medical history as listed below including metastatic squamous cell lung cancer and PUD, who presents to clinic today with a complaint of constipation.    06/15/2021 patient seen in clinic by Dr. Candis Schatz for epigastric pain.  That time he had presumed PUD and was scheduled for an EGD 7/14.  Given short-term pain control with Percocet and told to use Omeprazole 20 twice daily as well as Carafate 1 g 4 times daily and GI cocktail.    06/21/2021 EGD with a 4 cm hiatal hernia and erythematous mucosa in the gastric body, patient continued on Omeprazole 20 mg p.o. daily and told it was okay to stop Carafate.  Pathology showed mild chronic inflammatory changes.    12/14/2021 patient had thyroid profile which showed hypothyroidism.    01/23/2022 patient called and described no bowel movement for 5 days.  Apparently had tried MiraLAX and some enemas but nothing is helped.    Today, the patient is a very poor historian and it is hard to follow him.  He tells me that he recently went to [place] to the hospital and had an MRI which was "normal" within the past 3 weeks.  Tells me he has continued with severe epigastric pain worse with eating any solid foods.  Rated as a 10/10 at its worst. Due to this he has been juicing anything from lemons to pineapples etc.  Tells me that if he takes his pills he also develops bad stomach pain typically within 20 minutes of ingesting.  Describes this pain is debilitating which is what sent him to the ER recently (we do not have these reports).  Also describes that he has not been having normal bowel movements but "I have not been eatin anything I have just been juicing".  Also tells me he is not really taking any of his medicines and has just been chewing an Ibuprofen once every 3-4 hours which is about the only thing that helps with the pain but "I was on Lyrica at one point  and it helped too, I am going to ask for more of that".  He tells me he thinks his cancer is gone and believes he has a follow-up appointment with Dr. Donne Anon here in town though I cannot see one in his chart.  (Per records he has a video visit with oncology at [facility] today)    Denies fever, chills or blood in his stool.  Past Medical History:  Diagnosis Date   Asthma    Cerebral aneurysm    Lung cancer ([place])    PUD (peptic ulcer disease)     Past Surgical History:  Procedure Laterality Date   BRONCHOSCOPY      Current Outpatient Medications  Medication Sig Dispense Refill   Ascorbic Acid (VITAMIN C) 1000 MG tablet Take 1,000 mg by mouth daily.     budesonide-formoterol (SYMBICORT) 80-4.5 MCG/ACT inhaler Inhale 2 puffs into the lungs 2 (two) times daily. 1 Inhaler 12   feeding supplement, ENSURE ENLIVE, (ENSURE ENLIVE) LIQD Take 237 mLs by mouth 2 (two) times daily between meals. 237 mL 12   Ipratropium-Albuterol (COMBIVENT RESPIMAT) 20-100 MCG/ACT AERS respimat Inhale 1 puff into the lungs every 6 (six) hours as needed for up to 30 days for wheezing. 1 Inhaler 0   oxyCODONE-acetaminophen (PERCOCET) 10-325 MG tablet Take 1 tablet by mouth every 6 (six) hours as  needed for pain. 20 tablet 0   tiotropium (SPIRIVA HANDIHALER) 18 MCG inhalation capsule Place 1 capsule (18 mcg total) into inhaler and inhale daily. 30 capsule 2   Current Facility-Administered Medications  Medication Dose Route Frequency Provider Last Rate Last Admin   0.9 %  sodium chloride infusion  500 mL Intravenous Once Daryel November, MD        Allergies as of 01/25/2022   (No Known Allergies)    Family History  Problem Relation Age of Onset   Alzheimer's disease Mother    Schizophrenia Father    Suicidality Father     Social History   Socioeconomic History   Marital status: Divorced    Spouse name: Not on file   Number of children: 5   Years of education: Not on file   Highest education  level: Not on file  Occupational History   Occupation: disability  Tobacco Use   Smoking status: Former    Types: Cigarettes   Smokeless tobacco: Never  Vaping Use   Vaping Use: Never used  Substance and Sexual Activity   Alcohol use: Not Currently   Drug use: Not Currently   Sexual activity: Not on file  Other Topics Concern   Not on file  Social History Narrative   Not on file   Social Determinants of Health   Financial Resource Strain: Not on file  Food Insecurity: Not on file  Transportation Needs: Not on file  Physical Activity: Not on file  Stress: Not on file  Social Connections: Not on file  Intimate Partner Violence: Not on file    Review of Systems:    Constitutional: No weight loss, fever or chills Skin: No rash  Cardiovascular: No chest pain  Respiratory: No SOB  Gastrointestinal: See HPI and otherwise negative   Physical Exam:  Vital signs: BP 116/60    Pulse 70    Ht 6\' 2"  (1.88 m)    Wt 127 lb (57.6 kg)    SpO2 98%    BMI 16.31 kg/m    Constitutional:   Pleasant thin appearing AA male appears to be in NAD, Well developed, alert and cooperative Respiratory: Respirations even and unlabored. Lungs clear to auscultation bilaterally.   No wheezes, crackles, or rhonchi.  Cardiovascular: Normal S1, S2. No MRG. Regular rate and rhythm. No peripheral edema, cyanosis or pallor.  Gastrointestinal:  Soft, nondistended, nontender. No rebound or guarding. Normal bowel sounds. No appreciable masses or hepatomegaly. Rectal:  Not performed.  Psychiatric:  Demonstrates good judgement and reason without abnormal affect or behaviors.  See HPI for recent labs.  Assessment: 1.  Metastatic lung cancer 2.  Constipation: With below, likely related to decrease in diet 3.  Epigastric pain: Continues per patient, definitely worse after eating anything solid, though he can eat "Hershey bars" because they "melt in his stomach", no longer using Omeprazole, recent EGD with  gastritis which could be contributing though is likely not the sole cause of his pain, Lyrica seems to help which suggest a nerve origin and possible relation to lung cancer  Plan: 1.  Discussed with patient that Dr. Candis Schatz did not believe his abdominal pain was coming from his stomach after recent EGD.  Explained that this could be coming from his cancer or nerve pain especially if the Lyrica was helping.  Would recommend he try not to use Ibuprofen as much or he will end up with a stomach ulcer/gastritis which will worsen his pain. 2.  Represcribed Omeprazole  40 mg daily, 30-60 minutes for breakfast #30 with 5 refills 3.  Encouraged the patient to use MiraLAX once daily to avoid constipation, though likely his decrease in bowel movements is related to decrease in intake 4.  Recommend the patient make sure he has follow-up with an oncologist, it looks like he has a follow-up with [facility] today.  He has some misunderstandings about his cancer and he thinks it is gone. 5.  Refilled GI cocktail 5-10 mL every 4-6 hours as needed with 3 refills 6.  Patient to follow with Dr. Candis Schatz in the next 1 to 2 months or sooner if necessary.  Ellouise Newer, PA-C [facility] 01/25/2022, 8:37 AM  Cc: No ref. provider found

## 2022-01-29 NOTE — Progress Notes (Signed)
Agree with the assessment and plan as outlined by Jennifer Lemmon, PA-C. ? ?Aniyah Nobis E. Emelina Hinch, MD ? ?

## 2022-02-21 ENCOUNTER — Emergency Department (HOSPITAL_COMMUNITY): Payer: Medicaid Other

## 2022-02-21 ENCOUNTER — Encounter (HOSPITAL_COMMUNITY): Payer: Self-pay | Admitting: Emergency Medicine

## 2022-02-21 ENCOUNTER — Other Ambulatory Visit: Payer: Self-pay

## 2022-02-21 ENCOUNTER — Emergency Department (HOSPITAL_COMMUNITY)
Admission: EM | Admit: 2022-02-21 | Discharge: 2022-02-22 | Disposition: A | Discharge status: Other Acute Inpatient Hospital | Payer: Medicaid Other | Attending: Internal Medicine | Admitting: Internal Medicine

## 2022-02-21 DIAGNOSIS — C7951 Secondary malignant neoplasm of bone: Secondary | ICD-10-CM

## 2022-02-21 DIAGNOSIS — G9520 Unspecified cord compression: Secondary | ICD-10-CM | POA: Diagnosis present

## 2022-02-21 DIAGNOSIS — G959 Disease of spinal cord, unspecified: Secondary | ICD-10-CM | POA: Diagnosis not present

## 2022-02-21 DIAGNOSIS — Z20822 Contact with and (suspected) exposure to covid-19: Secondary | ICD-10-CM | POA: Insufficient documentation

## 2022-02-21 DIAGNOSIS — J45909 Unspecified asthma, uncomplicated: Secondary | ICD-10-CM | POA: Diagnosis not present

## 2022-02-21 DIAGNOSIS — Z79899 Other long term (current) drug therapy: Secondary | ICD-10-CM | POA: Insufficient documentation

## 2022-02-21 DIAGNOSIS — C7949 Secondary malignant neoplasm of other parts of nervous system: Secondary | ICD-10-CM | POA: Insufficient documentation

## 2022-02-21 DIAGNOSIS — M7989 Other specified soft tissue disorders: Secondary | ICD-10-CM | POA: Diagnosis present

## 2022-02-21 LAB — CBC WITH DIFFERENTIAL/PLATELET
Abs Immature Granulocytes: 0.07 10*3/uL (ref 0.00–0.07)
Basophils Absolute: 0 10*3/uL (ref 0.0–0.1)
Basophils Relative: 0 %
Eosinophils Absolute: 0 10*3/uL (ref 0.0–0.5)
Eosinophils Relative: 0 %
HCT: 35.5 % — ABNORMAL LOW (ref 39.0–52.0)
Hemoglobin: 12.1 g/dL — ABNORMAL LOW (ref 13.0–17.0)
Immature Granulocytes: 1 %
Lymphocytes Relative: 14 %
Lymphs Abs: 1.6 10*3/uL (ref 0.7–4.0)
MCH: 31.3 pg (ref 26.0–34.0)
MCHC: 34.1 g/dL (ref 30.0–36.0)
MCV: 92 fL (ref 80.0–100.0)
Monocytes Absolute: 0.4 10*3/uL (ref 0.1–1.0)
Monocytes Relative: 3 %
Neutro Abs: 9.4 10*3/uL — ABNORMAL HIGH (ref 1.7–7.7)
Neutrophils Relative %: 82 %
Platelets: 254 10*3/uL (ref 150–400)
RBC: 3.86 MIL/uL — ABNORMAL LOW (ref 4.22–5.81)
RDW: 16.9 % — ABNORMAL HIGH (ref 11.5–15.5)
WBC: 11.5 10*3/uL — ABNORMAL HIGH (ref 4.0–10.5)
nRBC: 0 % (ref 0.0–0.2)

## 2022-02-21 LAB — I-STAT CHEM 8, ED
BUN: 22 mg/dL (ref 8–23)
Calcium, Ion: 1.13 mmol/L — ABNORMAL LOW (ref 1.15–1.40)
Chloride: 97 mmol/L — ABNORMAL LOW (ref 98–111)
Creatinine, Ser: 1 mg/dL (ref 0.61–1.24)
Glucose, Bld: 126 mg/dL — ABNORMAL HIGH (ref 70–99)
HCT: 33 % — ABNORMAL LOW (ref 39.0–52.0)
Hemoglobin: 11.2 g/dL — ABNORMAL LOW (ref 13.0–17.0)
Potassium: 4.1 mmol/L (ref 3.5–5.1)
Sodium: 133 mmol/L — ABNORMAL LOW (ref 135–145)
TCO2: 28 mmol/L (ref 22–32)

## 2022-02-21 LAB — COMPREHENSIVE METABOLIC PANEL
ALT: 53 U/L — ABNORMAL HIGH (ref 0–44)
AST: 52 U/L — ABNORMAL HIGH (ref 15–41)
Albumin: 3.5 g/dL (ref 3.5–5.0)
Alkaline Phosphatase: 63 U/L (ref 38–126)
Anion gap: 8 (ref 5–15)
BUN: 23 mg/dL (ref 8–23)
CO2: 26 mmol/L (ref 22–32)
Calcium: 9 mg/dL (ref 8.9–10.3)
Chloride: 98 mmol/L (ref 98–111)
Creatinine, Ser: 1.02 mg/dL (ref 0.61–1.24)
GFR, Estimated: >60 mL/min (ref >60)
Glucose, Bld: 131 mg/dL — ABNORMAL HIGH (ref 70–99)
Potassium: 4.1 mmol/L (ref 3.5–5.1)
Sodium: 132 mmol/L — ABNORMAL LOW (ref 135–145)
Total Bilirubin: 0.3 mg/dL (ref 0.3–1.2)
Total Protein: 6.4 g/dL — ABNORMAL LOW (ref 6.5–8.1)

## 2022-02-21 LAB — URINALYSIS, ROUTINE W REFLEX MICROSCOPIC
Bacteria, UA: NONE SEEN
Bilirubin Urine: NEGATIVE
Glucose, UA: NEGATIVE mg/dL
Hgb urine dipstick: NEGATIVE
Ketones, ur: 5 mg/dL — AB
Nitrite: NEGATIVE
Protein, ur: NEGATIVE mg/dL
Specific Gravity, Urine: 1.041 — ABNORMAL HIGH (ref 1.005–1.030)
pH: 5 (ref 5.0–8.0)

## 2022-02-21 LAB — PROTIME-INR
INR: 0.9 (ref 0.8–1.2)
Prothrombin Time: 12.3 seconds (ref 11.4–15.2)

## 2022-02-21 LAB — LACTIC ACID, PLASMA
Lactic Acid, Venous: 2.4 mmol/L (ref 0.5–1.9)
Lactic Acid, Venous: 2.6 mmol/L (ref 0.5–1.9)

## 2022-02-21 LAB — RESP PANEL BY RT-PCR (FLU A&B, COVID) ARPGX2
Influenza A by PCR: NEGATIVE
Influenza B by PCR: NEGATIVE
SARS Coronavirus 2 by RT PCR: NEGATIVE

## 2022-02-21 LAB — RAPID URINE DRUG SCREEN, HOSP PERFORMED
Amphetamines: POSITIVE — AB
Barbiturates: NOT DETECTED
Benzodiazepines: NOT DETECTED
Cocaine: POSITIVE — AB
Opiates: POSITIVE — AB
Tetrahydrocannabinol: NOT DETECTED

## 2022-02-21 LAB — TYPE AND SCREEN
ABO/RH(D): A POS
Antibody Screen: NEGATIVE

## 2022-02-21 MED ORDER — GADOBUTROL 1 MMOL/ML IV SOLN
6.0000 mL | Freq: Once | INTRAVENOUS | Status: AC | PRN
  Administered 2022-02-21: 6 mL via INTRAVENOUS

## 2022-02-21 MED ORDER — IOHEXOL 350 MG/ML SOLN
100.0000 mL | Freq: Once | INTRAVENOUS | Status: AC | PRN
  Administered 2022-02-21: 100 mL via INTRAVENOUS

## 2022-02-21 MED ORDER — VANCOMYCIN HCL IN DEXTROSE 1-5 GM/200ML-% IV SOLN
1000.0000 mg | INTRAVENOUS | Status: AC
  Administered 2022-02-21: 1000 mg via INTRAVENOUS
  Filled 2022-02-21 (×2): qty 200

## 2022-02-21 MED ORDER — OXYCODONE HCL 5 MG PO TABS
15.0000 mg | ORAL_TABLET | Freq: Once | ORAL | Status: AC
  Administered 2022-02-21: 15 mg via ORAL
  Filled 2022-02-21: qty 3

## 2022-02-21 MED ORDER — MORPHINE SULFATE ER 15 MG PO TBCR
30.0000 mg | EXTENDED_RELEASE_TABLET | Freq: Once | ORAL | Status: AC
  Administered 2022-02-21: 30 mg via ORAL
  Filled 2022-02-21: qty 2

## 2022-02-21 MED ORDER — LACTATED RINGERS IV BOLUS
1000.0000 mL | Freq: Once | INTRAVENOUS | Status: AC
  Administered 2022-02-21: 1000 mL via INTRAVENOUS

## 2022-02-21 MED ORDER — PIPERACILLIN-TAZOBACTAM 3.375 G IVPB 30 MIN
3.3750 g | Freq: Once | INTRAVENOUS | Status: AC
Start: 2022-02-21 — End: 2022-02-22
  Administered 2022-02-21: 3.375 g via INTRAVENOUS
  Filled 2022-02-21: qty 50

## 2022-02-21 MED ORDER — LORAZEPAM 2 MG/ML IJ SOLN: 1.0000 mg | Freq: Once | INTRAMUSCULAR | Status: DC | PRN

## 2022-02-21 NOTE — Progress Notes (Signed)
A consult was received from an ED physician for [place] per pharmacy dosing.  The patient's profile has been reviewed for ht/wt/allergies/indication/available labs.   ?A one time order has been placed for Vancomycin 1g .  Further antibiotics/pharmacy consults should be ordered by admitting physician if indicated.       ?                ?Thank you, ?Alford Highland, [employer] ?02/21/2022  8:43 PM ? ?

## 2022-02-21 NOTE — ED Provider Notes (Signed)
?[facility] DEPT ?Provider Note ? ? ?CSN: [id number] ?Arrival date & time: 02/21/22  1606 ? ?  ? ?History ? ?Chief Complaint  ?Patient presents with  ? Leg Swelling  ? ? ?Randall Valencia is a 61 y.o. male. ? ?HPI ?61 year old male with a history of squamous cell carcinoma to the lung that is metastatic and followed at [facility] along with tobacco abuse, protein-calorie malnutrition, asthma, PUD presents with leg weakness and "paralysis".  He states this started a couple days ago though he cannot give an exact day.  He has been feeling lightheaded and has had weakness, right greater than left.  It feels like it is progressive.  He denies any pain but has had swelling to both legs, right worse than left.  He feels like the weakness in his right leg is up to his knee.  His left leg is weak but he can move it.  There is a tingling sensation.  He denies any new pain such as abdominal pain or back pain though he states he has a lesion on his spine and some chronic back pain. No headache. No bowel or bladder incontinence though he is chronically constipated. ? ?Home Medications ?Prior to Admission medications   ?Medication Sig Start Date End Date Taking? Authorizing Provider  ?budesonide-formoterol (SYMBICORT) 80-4.5 MCG/ACT inhaler Inhale 2 puffs into the lungs 2 (two) times daily. 05/17/19  Yes Bonnell Public, MD  ?feeding supplement, ENSURE ENLIVE, (ENSURE ENLIVE) LIQD Take 237 mLs by mouth 2 (two) times daily between meals. 05/17/19  Yes Bonnell Public, MD  ?levothyroxine (SYNTHROID) 50 MCG tablet Take 50 mcg by mouth daily. 02/13/22  Yes [provider]  ?morphine (MS CONTIN) 30 MG 12 hr tablet Take 30 mg by mouth 2 (two) times daily as needed for pain. 01/25/22  Yes [provider]  ?oxyCODONE (ROXICODONE) 15 MG immediate release tablet Take 15-30 mg by mouth every 4 (four) hours as needed for pain. 01/25/22  Yes [provider]  ?predniSONE (DELTASONE) 10 MG tablet  Take 20 mg by mouth daily with breakfast. Taking 2 tablets by mouth once daily.   Yes [provider]  ?pregabalin (LYRICA) 50 MG capsule Take 50 mg by mouth 3 (three) times daily. 01/25/22  Yes [provider]  ?VENTOLIN HFA 108 (90 Base) MCG/ACT inhaler Inhale 2 puffs into the lungs every 4 (four) hours as needed for shortness of breath or wheezing. 12/18/21  Yes [provider]  ?Ascorbic Acid (VITAMIN C) 1000 MG tablet Take 1,000 mg by mouth daily.    [provider]  ?Ipratropium-Albuterol (COMBIVENT RESPIMAT) 20-100 MCG/ACT AERS respimat Inhale 1 puff into the lungs every 6 (six) hours as needed for up to 30 days for wheezing. 05/17/19 06/15/21  Bonnell Public, MD  ?naloxone Karma Greaser) nasal spray 4 mg/0.1 mL SMARTSIG:Both Nares 11/29/21   [provider]  ?omeprazole (PRILOSEC) 40 MG capsule Take 1 capsule (40 mg total) by mouth daily. 01/25/22   Levin Erp, PA  ?   ? ?Allergies    ?Patient has no known allergies.   ? ?Review of Systems   ?Review of Systems  ?Respiratory:  Negative for shortness of breath.   ?Cardiovascular:  Negative for chest pain.  ?Gastrointestinal:  Negative for abdominal pain.  ?Genitourinary:   ?     No incontinence  ?Musculoskeletal:  Negative for back pain.  ?Neurological:  Positive for weakness and numbness. Negative for headaches.  ? ?Physical Exam ?Updated Vital  Signs ?BP 128/87   Pulse 85   Temp 97.7 ?F (36.5 ?C) (Oral)   Resp 18   Ht 6\' 2"  (1.88 m)   Wt 57 kg   SpO2 100%   BMI 16.13 kg/m?  ?Physical Exam ?Vitals and nursing note reviewed.  ?Constitutional:   ?   Appearance: He is well-developed.  ?HENT:  ?   Head: Normocephalic and atraumatic.  ?Cardiovascular:  ?   Rate and Rhythm: Normal rate and regular rhythm.  ?   Pulses:     ?     Popliteal pulses are 2+ on the right side and 2+ on the left side.  ?     Dorsalis pedis pulses are 0 on the right side and 0 on the left side.  ?     Posterior tibial pulses are  detected w/ Doppler on the right side and detected w/ Doppler on the left side.  ?   Heart sounds: Normal heart sounds.  ?Pulmonary:  ?   Effort: Pulmonary effort is normal.  ?   Breath sounds: Normal breath sounds.  ?Abdominal:  ?   Palpations: Abdomen is soft.  ?   Tenderness: There is no abdominal tenderness.  ?Musculoskeletal:  ?   Comments: No tenderness throughout his C/T/L-spine. ?See picture.  Patient's lower extremities are very cold.  I cannot feel any palpable pulses though I am able to Doppler pulses in the PT bilaterally.  The coldness is mostly lower legs and ankles and feet.  Right is swollen more than left.  ?Skin: ?   General: Skin is warm and dry.  ?Neurological:  ?   Mental Status: He is alert.  ?   Deep Tendon Reflexes:  ?   Reflex Scores: ?     Patellar reflexes are 2+ on the right side and 3+ on the left side. ?     Achilles reflexes are 0 on the right side and 0 on the left side. ?   Comments: Patient is able to minimally move the right lower extremity on the stretcher but no more than that.  He can lift his left lower extremity off the stretcher but it is weak against resistance.  Has intact diffuse sensation to the lower extremities though he reports it does not feel quite normal.  ? ? ?ED Results / Procedures / Treatments   ?Labs ?(all labs ordered are listed, but only abnormal results are displayed) ?Labs Reviewed  ?COMPREHENSIVE METABOLIC PANEL - Abnormal; Notable for the following components:  ?    Result Value  ? Sodium 132 (*)   ? Glucose, Bld 131 (*)   ? Total Protein 6.4 (*)   ? AST 52 (*)   ? ALT 53 (*)   ? All other components within normal limits  ?LACTIC ACID, PLASMA - Abnormal; Notable for the following components:  ? Lactic Acid, Venous 2.4 (*)   ? All other components within normal limits  ?LACTIC ACID, PLASMA - Abnormal; Notable for the following components:  ? Lactic Acid, Venous 2.6 (*)   ? All other components within normal limits  ?CBC WITH DIFFERENTIAL/PLATELET -  Abnormal; Notable for the following components:  ? WBC 11.5 (*)   ? RBC 3.86 (*)   ? Hemoglobin 12.1 (*)   ? HCT 35.5 (*)   ? RDW 16.9 (*)   ? Neutro Abs 9.4 (*)   ? All other components within normal limits  ?URINALYSIS, ROUTINE W REFLEX MICROSCOPIC - Abnormal; Notable for the following  components:  ? APPearance HAZY (*)   ? Specific Gravity, Urine 1.041 (*)   ? Ketones, ur 5 (*)   ? Leukocytes,Ua TRACE (*)   ? All other components within normal limits  ?RAPID URINE DRUG SCREEN, HOSP PERFORMED - Abnormal; Notable for the following components:  ? Opiates POSITIVE (*)   ? Cocaine POSITIVE (*)   ? Amphetamines POSITIVE (*)   ? All other components within normal limits  ?I-STAT CHEM 8, ED - Abnormal; Notable for the following components:  ? Sodium 133 (*)   ? Chloride 97 (*)   ? Glucose, Bld 126 (*)   ? Calcium, Ion 1.13 (*)   ? Hemoglobin 11.2 (*)   ? HCT 33.0 (*)   ? All other components within normal limits  ?RESP PANEL BY RT-PCR (FLU A&B, COVID) [id number]  ?CULTURE, BLOOD (ROUTINE X 2)  ?CULTURE, BLOOD (ROUTINE X 2)  ?PROTIME-INR  ?TYPE AND SCREEN  ?ABO/RH  ? ? ?EKG ?EKG Interpretation ? ?Date/Time:  Thursday February 21 2022 18:03:36 EDT ?Ventricular Rate:  66 ?PR Interval:  136 ?QRS Duration: 93 ?QT Interval:  428 ?QTC Calculation: 449 ?R Axis:   85 ?Text Interpretation: Sinus rhythm Borderline right axis deviation Probable LVH with secondary repol abnrm 12 Lead; Mason-Likar Confirmed by Sherwood Gambler [phone]) on 02/21/2022 6:52:12 PM ? ?Radiology ?MR THORACIC SPINE W WO CONTRAST ? ?Result Date: 02/21/2022 ?CLINICAL DATA:  Myelopathy EXAM: MRI THORACIC AND LUMBAR SPINE WITHOUT AND WITH CONTRAST TECHNIQUE: Multiplanar and multiecho pulse sequences of the thoracic and lumbar spine were obtained without and with intravenous contrast. CONTRAST:  68mL GADAVIST GADOBUTROL 1 MMOL/ML IV SOLN COMPARISON:  No prior MRI of the thoracic or lumbar spine, correlation is made with CTA chest 08/20/2021 and CTA abdomen pelvis 02/21/2022.  FINDINGS: MRI THORACIC SPINE FINDINGS Alignment: S shaped curvature of the thoracolumbar spine. No significant listhesis. Vertebrae: Increased T1, decreased T2, and heterogeneous enhancement throughout the T7 vertebral bo

## 2022-02-21 NOTE — ED Notes (Signed)
Pt roomed from triage but brought in by EMS from home c/o bilateral lower leg swelling. Pt states that is isn't able to walk, but was ambulatory with crutches 2 days ago. Pt states that he also has nearly no sensation to BLE, which is also new. Pt states that he has colon cancer and last treatment was about a month ago. Pt has undergone chemo and radiation. Pt's chart indicates that he has lung cancer, but pt states that he also has brain, spine and colon cancer. Pt unsure which is primary source. EMS reported to triage RN that anytime EMS is dispatched, PD has to respond to calls because pt is aggressive with EMS. Pt is A&Ox4, appears dirty and disheveled. Pt was able to stand from wheelchair and get onto stretcher with 2-3 assist. ?

## 2022-02-21 NOTE — ED Notes (Signed)
Face sheet faxed to [phone] ?

## 2022-02-21 NOTE — ED Notes (Signed)
Patient's pocket knife surrendered to security. Patient made aware to get the knife upon discharge. ?

## 2022-02-21 NOTE — ED Triage Notes (Signed)
BIB EMS from home, reports bilateral feet swelling the past 2 days. Was verbally aggressive w/ EMS.  ?

## 2022-02-22 DIAGNOSIS — Z79899 Other long term (current) drug therapy: Secondary | ICD-10-CM | POA: Diagnosis not present

## 2022-02-22 DIAGNOSIS — Z20822 Contact with and (suspected) exposure to covid-19: Secondary | ICD-10-CM | POA: Diagnosis not present

## 2022-02-22 DIAGNOSIS — J45909 Unspecified asthma, uncomplicated: Secondary | ICD-10-CM | POA: Diagnosis not present

## 2022-02-22 DIAGNOSIS — G959 Disease of spinal cord, unspecified: Secondary | ICD-10-CM | POA: Diagnosis not present

## 2022-02-22 DIAGNOSIS — G9520 Unspecified cord compression: Secondary | ICD-10-CM | POA: Diagnosis present

## 2022-02-22 DIAGNOSIS — M7989 Other specified soft tissue disorders: Secondary | ICD-10-CM | POA: Diagnosis present

## 2022-02-22 DIAGNOSIS — C7949 Secondary malignant neoplasm of other parts of nervous system: Secondary | ICD-10-CM | POA: Diagnosis not present

## 2022-02-22 MED ORDER — DEXAMETHASONE SODIUM PHOSPHATE 4 MG/ML IJ SOLN
4.0000 mg | Freq: Once | INTRAMUSCULAR | Status: AC
Start: 2022-02-22 — End: 2022-02-22
  Administered 2022-02-22: 4 mg via INTRAVENOUS
  Filled 2022-02-22: qty 1

## 2022-02-22 NOTE — ED Notes (Signed)
Call placed to cancel transport by [facility]  ?

## 2022-02-22 NOTE — ED Notes (Signed)
Call placed to [facility] regarding patient bed status [phone] "Randall Valencia" ?[facility] will call back when the bed is available ?When bed available: updated vital, drips, alert and orientation ?Then [facility] will connect the call to transportation so the ride can be ordered  ?

## 2022-02-22 NOTE — ED Notes (Signed)
Recalled Neurosurgery for call back to [facility] MD  ?

## 2022-02-22 NOTE — ED Notes (Signed)
Bed assignment :2W97  ?Address: [address] [place] , [place] [address]  ?Nurse call report at [phone]  ?Accepting doctor is  Nori Riis Ready MD  ?

## 2022-02-22 NOTE — ED Notes (Signed)
[facility] called to transfer patient to [facility].  ?

## 2022-02-22 NOTE — ED Notes (Signed)
Contacted flow manager and they are cancelling the bed at [facility]  ?

## 2022-02-22 NOTE — ED Notes (Signed)
ED TO INPATIENT HANDOFF REPORT ? ?Name/Age/Gender ?Randall Valencia ?61 y.o. ?male ? ?Code Status ?Code Status History   ? ? Date Active Date Inactive Code Status Order ID Comments User Context  ? 08/20/2021 1719 08/21/2021 1601 Full Code [id number]  Gladys Damme, MD ED  ? 05/11/2019 2111 05/17/2019 1921 Full Code [id number]  Vianne Bulls, MD ED  ? ?  ? ? ?Home/SNF/Other ?Home ? ?Chief Complaint ?Cord compression ([place]) [G95.20] ? ?Level of Care/Admitting Diagnosis ?ED Disposition   ? ? ED Disposition  ?Admit  ? Condition  ?--  ? Comment  ?Hospital Area: [facility] [[id number]] ? Level of Care: Progressive [102] ? Admit to Progressive based on following criteria: MULTISYSTEM THREATS such as stable sepsis, metabolic/electrolyte imbalance with or without encephalopathy that is responding to early treatment. ? May admit patient to [facility] or [facility] if equivalent level of care is available:: No ? Covid Evaluation: Asymptomatic - no recent exposure (last 10 days) testing not required ? Diagnosis: Cord compression [facility]) [884166] ? Admitting Physician: Rise Patience [phone] ? Attending Physician: Rise Patience [phone] ? Estimated length of stay: past midnight tomorrow ? Certification:: I certify this patient will need inpatient services for at least 2 midnights ?  ?  ? ?  ? ? ?Medical History ?Past Medical History:  ?Diagnosis Date  ? Asthma   ? Cerebral aneurysm   ? Lung cancer ([place])   ? states he also has or has had CA in brain, colon and spinal. states he believes lung is the primary source  ? PUD (peptic ulcer disease)   ? ? ?Allergies ?No Known Allergies ? ?IV Location/Drains/Wounds ?Patient Lines/Drains/Airways Status   ? ? Active Line/Drains/Airways   ? ? Name Placement date Placement time Site Days  ? Peripheral IV 02/21/22 20 G Anterior;Right Forearm 02/21/22  1813  Forearm  1  ? Peripheral IV 02/21/22 20 G Anterior;Left;Upper Arm 02/21/22  1814  Arm  1  ? ?  ?  ? ?   ? ? ?Labs/Imaging ?Results for orders placed or performed during the hospital encounter of 02/21/22 (from the past 48 hour(s))  ?Comprehensive metabolic panel     Status: Abnormal  ? Collection Time: 02/21/22  5:42 PM  ?Result Value Ref Range  ? Sodium 132 (L) 135 - 145 mmol/L  ? Potassium 4.1 3.5 - 5.1 mmol/L  ? Chloride 98 98 - 111 mmol/L  ? CO2 26 22 - 32 mmol/L  ? Glucose, Bld 131 (H) 70 - 99 mg/dL  ?  Comment: Glucose reference range applies only to samples taken after fasting for at least 8 hours.  ? BUN 23 8 - 23 mg/dL  ? Creatinine, Ser 1.02 0.61 - 1.24 mg/dL  ? Calcium 9.0 8.9 - 10.3 mg/dL  ? Total Protein 6.4 (L) 6.5 - 8.1 g/dL  ? Albumin 3.5 3.5 - 5.0 g/dL  ? AST 52 (H) 15 - 41 U/L  ? ALT 53 (H) 0 - 44 U/L  ? Alkaline Phosphatase 63 38 - 126 U/L  ? Total Bilirubin 0.3 0.3 - 1.2 mg/dL  ? GFR, Estimated >60 >60 mL/min  ?  Comment: (NOTE) ?Calculated using the CKD-EPI Creatinine Equation (2021) ?  ? Anion gap 8 5 - 15  ?  Comment: Performed at [facility], [place] [address], [place], [place] [address]  ?Lactic acid, plasma     Status: Abnormal  ? Collection Time: 02/21/22  5:42 PM  ?Result Value Ref  Range  ? Lactic Acid, Venous 2.4 (HH) 0.5 - 1.9 mmol/L  ?  Comment: CRITICAL RESULT CALLED TO, READ BACK BY AND VERIFIED WITH: ?GERBICH,A. RN @1900  ON 02/21/2022 BY COHEN,K ?Performed at [facility], [place] [address] [place], [place] [address] ?  ?CBC with Differential     Status: Abnormal  ? Collection Time: 02/21/22  5:42 PM  ?Result Value Ref Range  ? WBC 11.5 (H) 4.0 - 10.5 K/uL  ? RBC 3.86 (L) 4.22 - 5.81 MIL/uL  ? Hemoglobin 12.1 (L) 13.0 - 17.0 g/dL  ? HCT 35.5 (L) 39.0 - 52.0 %  ? MCV 92.0 80.0 - 100.0 fL  ? MCH 31.3 26.0 - 34.0 pg  ? MCHC 34.1 30.0 - 36.0 g/dL  ? RDW 16.9 (H) 11.5 - 15.5 %  ? Platelets 254 150 - 400 K/uL  ? nRBC 0.0 0.0 - 0.2 %  ? Neutrophils Relative % 82 %  ? Neutro Abs 9.4 (H) 1.7 - 7.7 K/uL  ? Lymphocytes Relative 14 %  ? Lymphs Abs 1.6 0.7 - 4.0  K/uL  ? Monocytes Relative 3 %  ? Monocytes Absolute 0.4 0.1 - 1.0 K/uL  ? Eosinophils Relative 0 %  ? Eosinophils Absolute 0.0 0.0 - 0.5 K/uL  ? Basophils Relative 0 %  ? Basophils Absolute 0.0 0.0 - 0.1 K/uL  ? Immature Granulocytes 1 %  ? Abs Immature Granulocytes 0.07 0.00 - 0.07 K/uL  ?  Comment: Performed at [facility], [place] [address] [place], [place] [address]  ?Protime-INR     Status: None  ? Collection Time: 02/21/22  5:42 PM  ?Result Value Ref Range  ? Prothrombin Time 12.3 11.4 - 15.2 seconds  ? INR 0.9 0.8 - 1.2  ?  Comment: (NOTE) ?INR goal varies based on device and disease states. ?Performed at [facility], [address] ?[place], [place] [address] ?  ?Resp Panel by RT-PCR (Flu A&B, Covid)     Status: None  ? Collection Time: 02/21/22  5:42 PM  ? Specimen: Nasopharyngeal(NP) swabs in vial transport medium  ?Result Value Ref Range  ? SARS Coronavirus 2 by RT PCR NEGATIVE NEGATIVE  ?  Comment: (NOTE) ?SARS-CoV-2 target nucleic acids are NOT DETECTED. ? ?The SARS-CoV-2 RNA is generally detectable in upper respiratory ?specimens during the acute phase of infection. The lowest ?concentration of SARS-CoV-2 viral copies this assay can detect is ?138 copies/mL. A negative result does not preclude SARS-Cov-2 ?infection and should not be used as the sole basis for treatment or ?other patient management decisions. A negative result may occur with  ?improper specimen collection/handling, submission of specimen other ?than nasopharyngeal swab, presence of viral mutation(s) within the ?areas targeted by this assay, and inadequate number of viral ?copies(<138 copies/mL). A negative result must be combined with ?clinical observations, patient history, and epidemiological ?information. The expected result is Negative. ? ?Fact Sheet for Patients:  ?EntrepreneurPulse.com.au ? ?Fact Sheet for Healthcare Providers:   ?IncredibleEmployment.be ? ?This test is no t yet approved or cleared by the [place] FDA and  ?has been authorized for detection and/or diagnosis of SARS-CoV-2 by ?FDA under an Emergency Use Authorization (EUA). This EUA will remain  ?in effect (meaning this test can be used) for the duration of the ?COVID-19 declaration under Section 564(b)(1) of the Act, 21 ?U.S.C.section 360bbb-3(b)(1), unless the authorization is terminated  ?or revoked sooner.  ? ? ?  ? Influenza A by PCR NEGATIVE NEGATIVE  ?  Influenza B by PCR NEGATIVE NEGATIVE  ?  Comment: (NOTE) ?The Xpert Xpress SARS-CoV-2/FLU/RSV plus assay is intended as an aid ?in the diagnosis of influenza from Nasopharyngeal swab specimens and ?should not be used as a sole basis for treatment. Nasal washings and ?aspirates are unacceptable for Xpert Xpress SARS-CoV-2/FLU/RSV ?testing. ? ?Fact Sheet for Patients: ?EntrepreneurPulse.com.au ? ?Fact Sheet for Healthcare Providers: ?IncredibleEmployment.be ? ?This test is not yet approved or cleared by the [place] FDA and ?has been authorized for detection and/or diagnosis of SARS-CoV-2 by ?FDA under an Emergency Use Authorization (EUA). This EUA will remain ?in effect (meaning this test can be used) for the duration of the ?COVID-19 declaration under Section 564(b)(1) of the Act, 21 U.S.C. ?section 360bbb-3(b)(1), unless the authorization is terminated or ?revoked. ? ?Performed at [facility], [address] ?[place], [place] [address] ?  ?Type and screen [place]     Status: None  ? Collection Time: 02/21/22  5:59 PM  ?Result Value Ref Range  ? ABO/RH(D) A POS   ? Antibody Screen NEG   ? Sample Expiration    ?  02/24/2022,2359 ?Performed at [facility], [place] [address], [place], [place] [address] ?  ?I-stat chem 8, ED     Status: Abnormal  ? Collection Time: 02/21/22  6:15 PM  ?Result Value Ref  Range  ? Sodium 133 (L) 135 - 145 mmol/L  ? Potassium 4.1 3.5 - 5.1 mmol/L  ? Chloride 97 (L) 98 - 111 mmol/L  ? BUN 22 8 - 23 mg/dL  ? Creatinine, Ser 1.00 0.61 - 1.24 mg/dL  ? Glucose, Bld 126 (H) 70 - 99 mg/dL  ?  Comment: Glucose reference range applies only to [employer]

## 2022-02-22 NOTE — ED Provider Notes (Addendum)
Unable to get in touch with [facility] ? ?1243 am:  Case d/w neurosurgeon on call at [facility], Dr. Glenford Peers.  admit to medicine at [facility] if unable to get in touch with [facility].  To be seen by neurosurgery and start decadron 4 mg IV ? ? ?136 Case d/w Dr. Reece Levy of neurosurgery at [facility]  They will accept the patient in transfer.   ?  Veatrice Kells, MD ?02/22/22 0143 ? ?

## 2022-02-22 NOTE — ED Notes (Signed)
Report called to the [facility] RN at [facility]. ?

## 2022-02-22 NOTE — ED Notes (Signed)
Per [facility] transfer they will pick patient at 7 am after Shift change. ?

## 2022-02-26 LAB — CULTURE, BLOOD (ROUTINE X 2)
Culture: NO GROWTH
Culture: NO GROWTH
Special Requests: ADEQUATE
Special Requests: ADEQUATE

## 2022-02-28 NOTE — Telephone Encounter (Signed)
Please get the patient set up for an appointment with Dr. Candis Schatz.  Sounds like he has a lot going on and we would definitely need to see him in clinic before scheduling a colonoscopy (hopefully with Dr. Candis Schatz himself).  We will also need to review all of his recent care at [facility]  ?Thanks, JLL  ? ?F/U appt on Friday, 03/08/22 at 11:10 am with Dr. Candis Schatz. Pt notified of recommendations and appt via my chart. ?

## 2022-03-05 NOTE — Telephone Encounter (Signed)
We are sorry to hear you are hospitalized at [facility] and can not walk. I have cancelled the appointment on 03-08-2022 as you requested. Call us or send [place] a my chart message when you are ready to reschedule. Get well soon. ?

## 2022-03-06 NOTE — Telephone Encounter (Signed)
Noted, thanks!

## 2022-03-08 ENCOUNTER — Ambulatory Visit: Payer: Medicaid Other | Admitting: Gastroenterology

## 2022-03-15 ENCOUNTER — Other Ambulatory Visit: Payer: Self-pay

## 2022-03-15 ENCOUNTER — Emergency Department (HOSPITAL_COMMUNITY): Payer: Medicaid Other

## 2022-03-15 ENCOUNTER — Inpatient Hospital Stay (HOSPITAL_COMMUNITY)
Admission: EM | Admit: 2022-03-15 | Discharge: 2022-03-18 | DRG: 557 | Disposition: A | Discharge status: Home / Self Care | Payer: Medicaid Other | Attending: Internal Medicine | Admitting: Internal Medicine

## 2022-03-15 DIAGNOSIS — E039 Hypothyroidism, unspecified: Secondary | ICD-10-CM | POA: Diagnosis present

## 2022-03-15 DIAGNOSIS — G9520 Unspecified cord compression: Secondary | ICD-10-CM | POA: Diagnosis present

## 2022-03-15 DIAGNOSIS — Z923 Personal history of irradiation: Secondary | ICD-10-CM

## 2022-03-15 DIAGNOSIS — C7931 Secondary malignant neoplasm of brain: Secondary | ICD-10-CM | POA: Diagnosis present

## 2022-03-15 DIAGNOSIS — I998 Other disorder of circulatory system: Principal | ICD-10-CM

## 2022-03-15 DIAGNOSIS — L89153 Pressure ulcer of sacral region, stage 3: Secondary | ICD-10-CM | POA: Diagnosis present

## 2022-03-15 DIAGNOSIS — Z91199 Patient's noncompliance with other medical treatment and regimen due to unspecified reason: Secondary | ICD-10-CM

## 2022-03-15 DIAGNOSIS — Z91148 Patient's other noncompliance with medication regimen for other reason: Secondary | ICD-10-CM

## 2022-03-15 DIAGNOSIS — J9611 Chronic respiratory failure with hypoxia: Secondary | ICD-10-CM | POA: Diagnosis present

## 2022-03-15 DIAGNOSIS — C7951 Secondary malignant neoplasm of bone: Secondary | ICD-10-CM | POA: Diagnosis present

## 2022-03-15 DIAGNOSIS — R5381 Other malaise: Secondary | ICD-10-CM | POA: Diagnosis present

## 2022-03-15 DIAGNOSIS — J449 Chronic obstructive pulmonary disease, unspecified: Secondary | ICD-10-CM | POA: Diagnosis present

## 2022-03-15 DIAGNOSIS — R339 Retention of urine, unspecified: Secondary | ICD-10-CM | POA: Diagnosis present

## 2022-03-15 DIAGNOSIS — G8220 Paraplegia, unspecified: Secondary | ICD-10-CM | POA: Diagnosis present

## 2022-03-15 DIAGNOSIS — Z79899 Other long term (current) drug therapy: Secondary | ICD-10-CM

## 2022-03-15 DIAGNOSIS — L8990 Pressure ulcer of unspecified site, unspecified stage: Secondary | ICD-10-CM | POA: Insufficient documentation

## 2022-03-15 DIAGNOSIS — Z7901 Long term (current) use of anticoagulants: Secondary | ICD-10-CM

## 2022-03-15 DIAGNOSIS — Z7951 Long term (current) use of inhaled steroids: Secondary | ICD-10-CM

## 2022-03-15 DIAGNOSIS — K219 Gastro-esophageal reflux disease without esophagitis: Secondary | ICD-10-CM | POA: Diagnosis present

## 2022-03-15 DIAGNOSIS — Z87891 Personal history of nicotine dependence: Secondary | ICD-10-CM

## 2022-03-15 DIAGNOSIS — C3490 Malignant neoplasm of unspecified part of unspecified bronchus or lung: Secondary | ICD-10-CM | POA: Diagnosis present

## 2022-03-15 DIAGNOSIS — Z79890 Hormone replacement therapy (postmenopausal): Secondary | ICD-10-CM

## 2022-03-15 DIAGNOSIS — N179 Acute kidney failure, unspecified: Secondary | ICD-10-CM | POA: Diagnosis present

## 2022-03-15 DIAGNOSIS — K5900 Constipation, unspecified: Secondary | ICD-10-CM | POA: Diagnosis present

## 2022-03-15 DIAGNOSIS — M62262 Nontraumatic ischemic infarction of muscle, left lower leg: Principal | ICD-10-CM | POA: Diagnosis present

## 2022-03-15 DIAGNOSIS — Z9981 Dependence on supplemental oxygen: Secondary | ICD-10-CM

## 2022-03-15 MED ORDER — HYDROMORPHONE HCL 1 MG/ML IJ SOLN
0.5000 mg | Freq: Once | INTRAMUSCULAR | Status: AC
  Administered 2022-03-15: 0.5 mg via INTRAVENOUS
  Filled 2022-03-15: qty 1

## 2022-03-15 MED ORDER — ONDANSETRON HCL 4 MG/2ML IJ SOLN
4.0000 mg | Freq: Once | INTRAMUSCULAR | Status: AC
  Administered 2022-03-15: 4 mg via INTRAVENOUS
  Filled 2022-03-15: qty 2

## 2022-03-15 NOTE — ED Provider Notes (Signed)
?[facility] DEPT ?Provider Note ? ? ?CSN: [id number] ?Arrival date & time: 03/15/22  2158 ? ?  ? ?History ? ?Chief Complaint  ?Patient presents with  ? Abdominal Pain  ? ? ?Randall Valencia is a 61 y.o. 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 ? ?Patient yelling and hollering initially upon arrival, calm oneself laid in the bed and rolled onto his left side.  Able to inform me that his belly pain started suddenly 3 hours ago and feels consistent with his history of constipation.  Per chart review patient was recently admitted at [facility] with concern for complications malignancy. Concern at that time for ischemia in bilateral LE, started on anticoagulation with reperfusion clinically.  At that time recommended discharge to SNF but patient refused and left AMA.  States he has not had any anticoagulation since that time. ? ?Level 5 caveat due to patient's acuity of presentation upon arrival. ? ?I have personally reviewed his medical records. In addition to the above he has hx of protein-calorie malnutrition, asthma, PUD.  ? ?HPI ? ?  ? ?Home Medications ?Prior to Admission medications   ?Medication Sig Start Date End Date Taking? Authorizing Provider  ?feeding supplement, ENSURE ENLIVE, (ENSURE ENLIVE) LIQD Take 237 mLs by mouth 2 (two) times daily between meals. 05/17/19   Bonnell Public, MD  ?levothyroxine (SYNTHROID) 50 MCG tablet Take 50 mcg by mouth daily. 02/13/22   [provider]  ?montelukast (SINGULAIR) 10 MG tablet Take 10 mg by mouth daily. 12/18/21   [provider]  ?morphine (MS CONTIN) 30 MG 12 hr tablet Take 30 mg by mouth 2 (two) times daily as needed for pain. 01/25/22   [provider]  ?naloxone (NARCAN) nasal spray 4 mg/0.1 mL SMARTSIG:Both Nares ?Patient not taking: Reported on 02/22/2022 11/29/21   [provider]  ?omeprazole (PRILOSEC) 40 MG capsule Take 1 capsule (40 mg total) by mouth daily. ?Patient not taking: Reported on 02/22/2022 01/25/22   Levin Erp, PA  ?oxyCODONE (ROXICODONE) 15 MG immediate release tablet Take 15-30 mg by mouth every 4 (four) hours as needed for pain. 01/25/22   [provider]  ?predniSONE (DELTASONE) 10 MG tablet Take 20 mg by mouth daily with breakfast. Taking 2 tablets by mouth once daily.    [provider]  ?pregabalin (LYRICA) 50 MG capsule Take 50 mg by mouth 3 (three) times daily. 01/25/22   [provider]  ?SYMBICORT 160-4.5 MCG/ACT inhaler Inhale 2 puffs into the lungs 2 (two) times daily. 01/20/22   [provider]  ?VENTOLIN HFA 108 (90 Base) MCG/ACT inhaler Inhale 2 puffs into the lungs every 4 (four) hours as needed for shortness of breath or wheezing. 12/18/21   [provider]  ?   ? ?Allergies    ?Patient has no known allergies.   ? ?Review of Systems   ?Review of Systems  ?Unable to perform ROS: Acuity of condition  ?Gastrointestinal:  Positive for abdominal pain.  ? ?Physical Exam ?Updated Vital Signs ?BP 120/90   Pulse 86   Temp 97.6 ?F (36.4 ?C) (Oral)   Resp 17   SpO2 100%  ?Physical Exam ?Vitals and nursing note reviewed.  ?Constitutional:   ?   Appearance: He is cachectic.  He is toxic-appearing.  ?HENT:  ?   Head: Normocephalic and atraumatic.  ?   Nose: Nose normal.  ?   Mouth/Throat:  ?   Mouth: Mucous membranes are moist.  ?   Pharynx: No oropharyngeal exudate or posterior oropharyngeal erythema.  ?Eyes:  ?   General: Lids are normal.     ?   Right eye: No discharge.     ?   Left eye: No discharge.  ?   Conjunctiva/sclera: Conjunctivae normal.  ?   Pupils: Pupils are equal, round, and reactive to light.  ?Neck:  ?   Trachea: Trachea and phonation normal.   ?Cardiovascular:  ?   Rate and Rhythm: Normal rate and regular rhythm.  ?   Pulses: Decreased pulses.     ?     Popliteal pulses are 2+ on the left side.  ?   Heart sounds: Normal heart sounds. No murmur heard. ?   Comments: Right PT pulse dopplerable ?Pulmonary:  ?   Effort: Tachypnea and accessory muscle usage present. No respiratory distress.  ?   Breath sounds: Examination of the left-lower field reveals decreased breath sounds. Decreased breath sounds present. No wheezing or rales.  ?Chest:  ?   Chest wall: No mass, lacerations, deformity, swelling, tenderness, crepitus or edema.  ?Abdominal:  ?   General: Bowel sounds are normal. There is distension.  ?   Palpations: Abdomen is rigid.  ?   Tenderness: There is generalized abdominal tenderness. There is guarding. There is no rebound.  ?   Hernia: A hernia is present. Hernia is present in the umbilical area.  ?Musculoskeletal:     ?   General: No deformity.  ?   Cervical back: Normal range of motion and neck supple.  ?   Right lower leg: 1+ Edema present.  ?   Left lower leg: 1+ Edema present.  ?Lymphadenopathy:  ?   Cervical: No cervical adenopathy.  ?Skin: ?   General: Skin is warm and dry.  ?   Capillary Refill: Capillary refill takes 2 to 3 seconds.  ? ?    ?Neurological:  ?   Mental Status: He is alert. Mental status is at baseline.  ?Psychiatric:     ?   Mood and Affect: Mood normal. Affect is angry.     ?   Behavior: Behavior is uncooperative and aggressive.  ? ? ?ED Results / Procedures / Treatments   ?Labs ?(all labs ordered are listed, but only abnormal results are displayed) ?Labs Reviewed  ?COMPREHENSIVE METABOLIC PANEL - Abnormal; Notable for the following components:  ?    Result Value  ? Glucose, Bld 113 (*)   ? BUN 68 (*)   ? Creatinine, Ser 1.93 (*)   ? Calcium 8.6 (*)   ? Albumin 3.1 (*)   ? GFR, Estimated 39 (*)   ? All other components within normal limits  ?CBC WITH DIFFERENTIAL/PLATELET - Abnormal; Notable for the following components:  ?  RBC 3.44 (*)   ? Hemoglobin 11.5 (*)   ? HCT 33.1 (*)   ? RDW 17.0 (*)   ? Lymphs Abs 0.2 (*)   ? Abs Immature Granulocytes 0.11 (*)   ? All other components within normal limits  ?URINE CULTURE  ?CULTURE, BLOOD (ROUTINE X 2)  ?CULTURE, BLOOD (ROUTINE X 2)  ?LIPASE, BLOOD  ?PROTIME-INR  ?LACTIC ACID, PLASMA  ?URINALYSIS, ROUTINE W REFLEX MICROSCOPIC  ?LACTIC ACID, PLASMA  ?MAGNESIUM  ?COMPREHENSIVE METABOLIC PANEL  ? ? ?  EKG ?None ? ?Radiology ?CT ANGIO AO+BIFEM W & OR WO CONTRAST ? ?Result Date: 03/16/2022 ?CLINICAL DATA:  Abdominal distension and decreased peripheral pulses EXAM: CT ANGIOGRAPHY OF ABDOMINAL AORTA WITH ILIOFEMORAL RUNOFF TECHNIQUE: Multidetector CT imaging of the abdomen, pelvis and lower extremities was performed using the standard protocol during bolus administration of intravenous contrast. Multiplanar CT image reconstructions and MIPs were obtained to evaluate the vascular anatomy. RADIATION DOSE REDUCTION: This exam was performed according to the departmental dose-optimization program which includes automated exposure control, adjustment of the mA and/or kV according to patient size and/or use of iterative reconstruction technique. CONTRAST:  136mL OMNIPAQUE IOHEXOL 350 MG/ML SOLN COMPARISON:  Similar exam from 02/21/2022 FINDINGS: VASCULAR Aorta: Abdominal aorta demonstrates minimal atherosclerotic change without aneurysmal dilatation or dissection. Celiac: Patent without evidence of aneurysm, dissection, vasculitis or significant stenosis. SMA: Patent without evidence of aneurysm, dissection, vasculitis or significant stenosis. Renals: Dual renal arteries on the right with single renal artery on the left. No focal stenosis is noted. IMA: Patent without evidence of aneurysm, dissection, vasculitis or significant stenosis. RIGHT Lower Extremity Inflow: Iliacs demonstrate mild atherosclerotic calcification without aneurysmal dilatation or stenosis. Runoff: Common femoral artery demonstrates  atherosclerotic calcifications without focal stenosis. Superficial femoral artery and popliteal artery are well visualized. The popliteal trifurcation is widely patent with three-vessel runoff to the right ankle. The po

## 2022-03-15 NOTE — ED Triage Notes (Signed)
Patient BIB EMS for evaluation of abdominal pain.  Pt uncooperative with EMS and would not answer questions.  Stated "stop asking questions and fix my problem."   ?

## 2022-03-16 ENCOUNTER — Encounter (HOSPITAL_COMMUNITY): Payer: Medicaid Other

## 2022-03-16 ENCOUNTER — Emergency Department (HOSPITAL_COMMUNITY): Payer: Medicaid Other

## 2022-03-16 ENCOUNTER — Encounter (HOSPITAL_COMMUNITY): Payer: Self-pay

## 2022-03-16 DIAGNOSIS — L0390 Cellulitis, unspecified: Secondary | ICD-10-CM | POA: Diagnosis not present

## 2022-03-16 DIAGNOSIS — K5900 Constipation, unspecified: Secondary | ICD-10-CM | POA: Diagnosis present

## 2022-03-16 DIAGNOSIS — Z87891 Personal history of nicotine dependence: Secondary | ICD-10-CM | POA: Diagnosis not present

## 2022-03-16 DIAGNOSIS — C3490 Malignant neoplasm of unspecified part of unspecified bronchus or lung: Secondary | ICD-10-CM | POA: Diagnosis present

## 2022-03-16 DIAGNOSIS — Z79899 Other long term (current) drug therapy: Secondary | ICD-10-CM | POA: Diagnosis not present

## 2022-03-16 DIAGNOSIS — Z91148 Patient's other noncompliance with medication regimen for other reason: Secondary | ICD-10-CM | POA: Diagnosis not present

## 2022-03-16 DIAGNOSIS — E039 Hypothyroidism, unspecified: Secondary | ICD-10-CM

## 2022-03-16 DIAGNOSIS — J9611 Chronic respiratory failure with hypoxia: Secondary | ICD-10-CM | POA: Diagnosis present

## 2022-03-16 DIAGNOSIS — Z9981 Dependence on supplemental oxygen: Secondary | ICD-10-CM | POA: Diagnosis not present

## 2022-03-16 DIAGNOSIS — G8220 Paraplegia, unspecified: Secondary | ICD-10-CM | POA: Diagnosis present

## 2022-03-16 DIAGNOSIS — Z7189 Other specified counseling: Secondary | ICD-10-CM

## 2022-03-16 DIAGNOSIS — Z7901 Long term (current) use of anticoagulants: Secondary | ICD-10-CM | POA: Diagnosis not present

## 2022-03-16 DIAGNOSIS — K219 Gastro-esophageal reflux disease without esophagitis: Secondary | ICD-10-CM

## 2022-03-16 DIAGNOSIS — N179 Acute kidney failure, unspecified: Secondary | ICD-10-CM | POA: Diagnosis present

## 2022-03-16 DIAGNOSIS — C7931 Secondary malignant neoplasm of brain: Secondary | ICD-10-CM | POA: Diagnosis present

## 2022-03-16 DIAGNOSIS — Z91199 Patient's noncompliance with other medical treatment and regimen due to unspecified reason: Secondary | ICD-10-CM | POA: Diagnosis not present

## 2022-03-16 DIAGNOSIS — Z7951 Long term (current) use of inhaled steroids: Secondary | ICD-10-CM | POA: Diagnosis not present

## 2022-03-16 DIAGNOSIS — C7951 Secondary malignant neoplasm of bone: Secondary | ICD-10-CM | POA: Diagnosis present

## 2022-03-16 DIAGNOSIS — L8990 Pressure ulcer of unspecified site, unspecified stage: Secondary | ICD-10-CM | POA: Insufficient documentation

## 2022-03-16 DIAGNOSIS — M62262 Nontraumatic ischemic infarction of muscle, left lower leg: Secondary | ICD-10-CM

## 2022-03-16 DIAGNOSIS — I70222 Atherosclerosis of native arteries of extremities with rest pain, left leg: Secondary | ICD-10-CM | POA: Diagnosis present

## 2022-03-16 DIAGNOSIS — L89153 Pressure ulcer of sacral region, stage 3: Secondary | ICD-10-CM | POA: Diagnosis present

## 2022-03-16 DIAGNOSIS — Z923 Personal history of irradiation: Secondary | ICD-10-CM | POA: Diagnosis not present

## 2022-03-16 DIAGNOSIS — G9520 Unspecified cord compression: Secondary | ICD-10-CM | POA: Diagnosis present

## 2022-03-16 DIAGNOSIS — R339 Retention of urine, unspecified: Secondary | ICD-10-CM | POA: Diagnosis present

## 2022-03-16 DIAGNOSIS — J449 Chronic obstructive pulmonary disease, unspecified: Secondary | ICD-10-CM | POA: Diagnosis present

## 2022-03-16 DIAGNOSIS — I739 Peripheral vascular disease, unspecified: Secondary | ICD-10-CM | POA: Diagnosis not present

## 2022-03-16 DIAGNOSIS — R5381 Other malaise: Secondary | ICD-10-CM | POA: Diagnosis present

## 2022-03-16 DIAGNOSIS — I998 Other disorder of circulatory system: Secondary | ICD-10-CM

## 2022-03-16 DIAGNOSIS — Z79890 Hormone replacement therapy (postmenopausal): Secondary | ICD-10-CM | POA: Diagnosis not present

## 2022-03-16 LAB — URINALYSIS, ROUTINE W REFLEX MICROSCOPIC
Bilirubin Urine: NEGATIVE
Glucose, UA: NEGATIVE mg/dL
Ketones, ur: NEGATIVE mg/dL
Leukocytes,Ua: NEGATIVE
Nitrite: NEGATIVE
Protein, ur: NEGATIVE mg/dL
Specific Gravity, Urine: 1.026 (ref 1.005–1.030)
pH: 5 (ref 5.0–8.0)

## 2022-03-16 LAB — COMPREHENSIVE METABOLIC PANEL
ALT: 24 U/L (ref 0–44)
ALT: 30 U/L (ref 0–44)
AST: 25 U/L (ref 15–41)
AST: 31 U/L (ref 15–41)
Albumin: 2.4 g/dL — ABNORMAL LOW (ref 3.5–5.0)
Albumin: 3.1 g/dL — ABNORMAL LOW (ref 3.5–5.0)
Alkaline Phosphatase: 66 U/L (ref 38–126)
Alkaline Phosphatase: 84 U/L (ref 38–126)
Anion gap: 10 (ref 5–15)
Anion gap: 9 (ref 5–15)
BUN: 65 mg/dL — ABNORMAL HIGH (ref 8–23)
BUN: 68 mg/dL — ABNORMAL HIGH (ref 8–23)
CO2: 25 mmol/L (ref 22–32)
CO2: 27 mmol/L (ref 22–32)
Calcium: 7.9 mg/dL — ABNORMAL LOW (ref 8.9–10.3)
Calcium: 8.6 mg/dL — ABNORMAL LOW (ref 8.9–10.3)
Chloride: 100 mmol/L (ref 98–111)
Chloride: 99 mmol/L (ref 98–111)
Creatinine, Ser: 1.81 mg/dL — ABNORMAL HIGH (ref 0.61–1.24)
Creatinine, Ser: 1.93 mg/dL — ABNORMAL HIGH (ref 0.61–1.24)
GFR, Estimated: 39 mL/min — ABNORMAL LOW (ref >60)
GFR, Estimated: 42 mL/min — ABNORMAL LOW (ref >60)
Glucose, Bld: 113 mg/dL — ABNORMAL HIGH (ref 70–99)
Glucose, Bld: 83 mg/dL (ref 70–99)
Potassium: 4.2 mmol/L (ref 3.5–5.1)
Potassium: 4.6 mmol/L (ref 3.5–5.1)
Sodium: 134 mmol/L — ABNORMAL LOW (ref 135–145)
Sodium: 136 mmol/L (ref 135–145)
Total Bilirubin: 0.4 mg/dL (ref 0.3–1.2)
Total Bilirubin: 0.9 mg/dL (ref 0.3–1.2)
Total Protein: 5.3 g/dL — ABNORMAL LOW (ref 6.5–8.1)
Total Protein: 6.6 g/dL (ref 6.5–8.1)

## 2022-03-16 LAB — CBC WITH DIFFERENTIAL/PLATELET
Abs Immature Granulocytes: 0.11 10*3/uL — ABNORMAL HIGH (ref 0.00–0.07)
Basophils Absolute: 0 10*3/uL (ref 0.0–0.1)
Basophils Relative: 1 %
Eosinophils Absolute: 0 10*3/uL (ref 0.0–0.5)
Eosinophils Relative: 1 %
HCT: 33.1 % — ABNORMAL LOW (ref 39.0–52.0)
Hemoglobin: 11.5 g/dL — ABNORMAL LOW (ref 13.0–17.0)
Immature Granulocytes: 2 %
Lymphocytes Relative: 3 %
Lymphs Abs: 0.2 10*3/uL — ABNORMAL LOW (ref 0.7–4.0)
MCH: 33.4 pg (ref 26.0–34.0)
MCHC: 34.7 g/dL (ref 30.0–36.0)
MCV: 96.2 fL (ref 80.0–100.0)
Monocytes Absolute: 0.3 10*3/uL (ref 0.1–1.0)
Monocytes Relative: 5 %
Neutro Abs: 5.7 10*3/uL (ref 1.7–7.7)
Neutrophils Relative %: 88 %
Platelets: 187 10*3/uL (ref 150–400)
RBC: 3.44 MIL/uL — ABNORMAL LOW (ref 4.22–5.81)
RDW: 17 % — ABNORMAL HIGH (ref 11.5–15.5)
WBC: 6.3 10*3/uL (ref 4.0–10.5)
nRBC: 0 % (ref 0.0–0.2)

## 2022-03-16 LAB — HEPARIN LEVEL (UNFRACTIONATED)
Heparin Unfractionated: 0.17 IU/mL — ABNORMAL LOW (ref 0.30–0.70)
Heparin Unfractionated: 0.22 IU/mL — ABNORMAL LOW (ref 0.30–0.70)

## 2022-03-16 LAB — MAGNESIUM: Magnesium: 2.3 mg/dL (ref 1.7–2.4)

## 2022-03-16 LAB — PROTIME-INR
INR: 1 (ref 0.8–1.2)
Prothrombin Time: 12.9 seconds (ref 11.4–15.2)

## 2022-03-16 LAB — LIPASE, BLOOD: Lipase: 20 U/L (ref 11–51)

## 2022-03-16 LAB — LACTIC ACID, PLASMA
Lactic Acid, Venous: 1.5 mmol/L (ref 0.5–1.9)
Lactic Acid, Venous: 2 mmol/L (ref 0.5–1.9)

## 2022-03-16 MED ORDER — HYDROMORPHONE HCL 1 MG/ML IJ SOLN
0.5000 mg | INTRAMUSCULAR | Status: DC | PRN
  Administered 2022-03-16 – 2022-03-17 (×3): 0.5 mg via INTRAVENOUS
  Filled 2022-03-16 (×2): qty 0.5
  Filled 2022-03-16: qty 1

## 2022-03-16 MED ORDER — ALUM & MAG HYDROXIDE-SIMETH 200-200-20 MG/5ML PO SUSP
30.0000 mL | ORAL | Status: DC | PRN
  Administered 2022-03-16: 30 mL via ORAL
  Filled 2022-03-16: qty 30

## 2022-03-16 MED ORDER — TAMSULOSIN HCL 0.4 MG PO CAPS
0.4000 mg | ORAL_CAPSULE | Freq: Every day | ORAL | Status: DC
  Administered 2022-03-16 – 2022-03-17 (×2): 0.4 mg via ORAL
  Filled 2022-03-16 (×2): qty 1

## 2022-03-16 MED ORDER — PANTOPRAZOLE SODIUM 40 MG IV SOLR
40.0000 mg | INTRAVENOUS | Status: DC
  Administered 2022-03-16: 40 mg via INTRAVENOUS
  Filled 2022-03-16: qty 10

## 2022-03-16 MED ORDER — HEPARIN BOLUS VIA INFUSION
4000.0000 [IU] | Freq: Once | INTRAVENOUS | Status: AC
  Administered 2022-03-16: 4000 [IU] via INTRAVENOUS
  Filled 2022-03-16: qty 4000

## 2022-03-16 MED ORDER — LACTATED RINGERS IV SOLN: INTRAVENOUS | Status: DC

## 2022-03-16 MED ORDER — HEPARIN BOLUS VIA INFUSION
1500.0000 [IU] | Freq: Once | INTRAVENOUS | Status: AC
  Administered 2022-03-16: 1500 [IU] via INTRAVENOUS
  Filled 2022-03-16: qty 1500

## 2022-03-16 MED ORDER — TIOTROPIUM BROMIDE MONOHYDRATE 1.25 MCG/ACT IN AERS: 2.0000 | INHALATION_SPRAY | Freq: Every day | RESPIRATORY_TRACT | Status: DC

## 2022-03-16 MED ORDER — LEVOTHYROXINE SODIUM 100 MCG PO TABS
100.0000 ug | ORAL_TABLET | Freq: Every day | ORAL | Status: DC
  Administered 2022-03-17: 100 ug via ORAL
  Filled 2022-03-16: qty 1

## 2022-03-16 MED ORDER — ALBUTEROL SULFATE (2.5 MG/3ML) 0.083% IN NEBU: 2.5000 mg | INHALATION_SOLUTION | RESPIRATORY_TRACT | Status: DC | PRN

## 2022-03-16 MED ORDER — NALOXONE HCL 0.4 MG/ML IJ SOLN: 0.4000 mg | INTRAMUSCULAR | Status: DC | PRN

## 2022-03-16 MED ORDER — MOMETASONE FURO-FORMOTEROL FUM 200-5 MCG/ACT IN AERO
2.0000 | INHALATION_SPRAY | Freq: Two times a day (BID) | RESPIRATORY_TRACT | Status: DC
Start: 2022-03-16 — End: 2022-03-18
  Administered 2022-03-16 – 2022-03-17 (×3): 2 via RESPIRATORY_TRACT
  Filled 2022-03-16 (×2): qty 8.8

## 2022-03-16 MED ORDER — ADULT MULTIVITAMIN W/MINERALS CH
1.0000 | ORAL_TABLET | Freq: Every day | ORAL | Status: DC
  Administered 2022-03-16 – 2022-03-17 (×2): 1 via ORAL
  Filled 2022-03-16 (×2): qty 1

## 2022-03-16 MED ORDER — ZINC SULFATE 220 (50 ZN) MG PO CAPS
220.0000 mg | ORAL_CAPSULE | Freq: Every day | ORAL | Status: DC
  Administered 2022-03-16: 220 mg via ORAL
  Filled 2022-03-16 (×3): qty 1

## 2022-03-16 MED ORDER — ACETAMINOPHEN 650 MG RE SUPP: 650.0000 mg | Freq: Four times a day (QID) | RECTAL | Status: DC | PRN

## 2022-03-16 MED ORDER — POLYETHYLENE GLYCOL 3350 17 G PO PACK
17.0000 g | PACK | Freq: Every day | ORAL | Status: DC | PRN
  Administered 2022-03-17: 17 g via ORAL
  Filled 2022-03-16: qty 1

## 2022-03-16 MED ORDER — LACTATED RINGERS IV BOLUS
1000.0000 mL | Freq: Once | INTRAVENOUS | Status: AC
  Administered 2022-03-16: 1000 mL via INTRAVENOUS

## 2022-03-16 MED ORDER — IOHEXOL 350 MG/ML SOLN
100.0000 mL | Freq: Once | INTRAVENOUS | Status: AC | PRN
  Administered 2022-03-16: 100 mL via INTRAVENOUS

## 2022-03-16 MED ORDER — ONDANSETRON HCL 4 MG/2ML IJ SOLN: 4.0000 mg | Freq: Four times a day (QID) | INTRAMUSCULAR | Status: DC | PRN

## 2022-03-16 MED ORDER — ENSURE ENLIVE PO LIQD
237.0000 mL | Freq: Four times a day (QID) | ORAL | Status: DC
  Administered 2022-03-16 (×2): 237 mL via ORAL

## 2022-03-16 MED ORDER — PANTOPRAZOLE SODIUM 40 MG PO TBEC
40.0000 mg | DELAYED_RELEASE_TABLET | Freq: Every day | ORAL | Status: DC
  Administered 2022-03-17: 40 mg via ORAL
  Filled 2022-03-16: qty 1

## 2022-03-16 MED ORDER — ALBUTEROL SULFATE HFA 108 (90 BASE) MCG/ACT IN AERS: 2.0000 | INHALATION_SPRAY | RESPIRATORY_TRACT | Status: DC | PRN

## 2022-03-16 MED ORDER — HEPARIN (PORCINE) 25000 UT/250ML-% IV SOLN
1400.0000 [IU]/h | INTRAVENOUS | Status: DC
  Administered 2022-03-16: 1000 [IU]/h via INTRAVENOUS
  Administered 2022-03-17 (×2): 1400 [IU]/h via INTRAVENOUS
  Filled 2022-03-16 (×5): qty 250

## 2022-03-16 MED ORDER — ASCORBIC ACID 500 MG PO TABS
500.0000 mg | ORAL_TABLET | Freq: Two times a day (BID) | ORAL | Status: DC
  Administered 2022-03-16 – 2022-03-17 (×3): 500 mg via ORAL
  Filled 2022-03-16 (×3): qty 1

## 2022-03-16 MED ORDER — UMECLIDINIUM BROMIDE 62.5 MCG/ACT IN AEPB
1.0000 | INHALATION_SPRAY | Freq: Every day | RESPIRATORY_TRACT | Status: DC
  Filled 2022-03-16: qty 7

## 2022-03-16 MED ORDER — ACETAMINOPHEN 325 MG PO TABS: 650.0000 mg | ORAL_TABLET | Freq: Four times a day (QID) | ORAL | Status: DC | PRN

## 2022-03-16 MED ORDER — OXYCODONE HCL 5 MG PO TABS: 5.0000 mg | ORAL_TABLET | Freq: Four times a day (QID) | ORAL | Status: DC | PRN

## 2022-03-16 MED ORDER — PREGABALIN 50 MG PO CAPS
50.0000 mg | ORAL_CAPSULE | Freq: Three times a day (TID) | ORAL | Status: DC
  Administered 2022-03-16 – 2022-03-17 (×3): 50 mg via ORAL
  Filled 2022-03-16 (×3): qty 1

## 2022-03-16 MED ORDER — DOCUSATE SODIUM 100 MG PO CAPS
100.0000 mg | ORAL_CAPSULE | Freq: Two times a day (BID) | ORAL | Status: DC
  Administered 2022-03-16 – 2022-03-17 (×2): 100 mg via ORAL
  Filled 2022-03-16 (×2): qty 1

## 2022-03-16 NOTE — ED Notes (Signed)
In and out catheter used to obtain urine sample.  Patient noted with output over 1000 mL of urine.  PA made aware.  Patient states "I have been telling them I couldn't pee."  Catheter ordered and placed.  Linen and gown changed.  Patient noted to have pressure wound to sacrum.  Bandage placed. ?

## 2022-03-16 NOTE — Assessment & Plan Note (Signed)
? ?#)   Metastatic squamous cell lung cancer completed by metastatic disease reportedly to the brain as well as to the vertebrae resulting in spinal cord compression complicated by bilateral lower extremity paralysis and urinary retention, as further outlined above. the patient conveys that he no longer wishes to pursue chemotherapy or radiation, including no additional palliative radiation, including no additional palliative radiation to his metastatic lesion to the spine.  ? ?Plan: I have placed a palliative care consult for further assistance with clarification of goals of care.  Prn IV Dilaudid. ? ?

## 2022-03-16 NOTE — Assessment & Plan Note (Signed)
? ?#)   GERD: documented h/o such; on omeprazole as outpatient.  ? ?Plan: In the setting of current n.p.o. status, will temporarily transition to oral omeprazole to IV Protonix for daily administration. ? ?

## 2022-03-16 NOTE — Progress Notes (Signed)
ANTICOAGULATION CONSULT NOTE ? ?Pharmacy Consult for heparin ?Indication: DVT ? ?No Known Allergies ? ?Patient Measurements: ?Weight: 56.9 kg (125 lb 7.1 oz) ?Heparin Dosing Weight: 62.6kg ? ?Vital Signs: ?Temp: 99.6 ?F (37.6 ?C) (04/08 1443) ?Temp Source: Oral (04/08 1443) ?BP: 109/71 (04/08 1443) ?Pulse Rate: 95 (04/08 1443) ? ?Labs: ?Recent Labs  ?  03/15/22 ?2355 03/16/22 ?6195 03/16/22 ?0932 03/16/22 ?1756  ?HGB 11.5*  --   --   --   ?HCT 33.1*  --   --   --   ?PLT 187  --   --   --   ?LABPROT 12.9  --   --   --   ?INR 1.0  --   --   --   ?HEPARINUNFRC  --   --  0.17* 0.22*  ?CREATININE 1.93* 1.81*  --   --   ? ? ? ?Estimated Creatinine Clearance: 34.1 mL/min (A) (by C-G formula based on SCr of 1.81 mg/dL (H)). ? ? ?Assessment: ?39 YOM with history of metastatic SCLC with mets to the brain, spine with spinal cord compression and bilateral LE paralysis, chronic opiate-induced constipation, and history of homelessness who presents via EMS from family members home with concern for abdominal pain.  Per chart review pt was recently admitted at [facility] with concern for complications malignancy. Concern at that time for ischemia in bilateral LE, started on anticoagulation with reperfusion clinically.  At that time recommended discharge to SNF but patient refused and left AMA.  States he has not had any anticoagulation since that time. Pharmacy to dose heparin drip for arterial emobli in the LLE, ischemic leg. ? ?Heparin level remains subtherapeutic at 0.22.  ? ?Goal of Therapy:  ?Heparin level 0.3-0.7 units/ml ?Monitor platelets by anticoagulation protocol: Yes ?  ?Plan:  ?Heparin bolus 1500 units, then ?Increase heparin drip to 1400 units/hr ?Check 6 hr heparin level ?Daily heparin level and CBC ? ? ? ?Arrie Senate, PharmD, BCPS, BCCP ?Clinical Pharmacist ?[phone] ?Please check AMION for all [place] numbers ?03/16/2022 ? ? ?

## 2022-03-16 NOTE — Progress Notes (Signed)
?  Transition of Care (TOC) Screening Note ? ? ?Patient Details  ?Name: Randall Valencia ?Date of Birth: 12-18-1960 ? ? ?Transition of Care (TOC) CM/SW Contact:    ?Alfredia Ferguson, LCSW ?Phone Number: ?03/16/2022, 8:19 AM ? ? ? ?Transition of Care Department [facility]) has reviewed patient and noted no immediate TOC needs pending continued medical work-up. TOC team will continue to monitor patient advancement through interdisciplinary progression rounds to support any identified discharge supports as needed. If new patient transition needs arise, please place a TOC consult or reach out to [facility] team.  ?  ?

## 2022-03-16 NOTE — Progress Notes (Signed)
Initial Nutrition Assessment ? ?DOCUMENTATION CODES:  ? ?Underweight ? ?INTERVENTION:  ? ?-Ensure Enlive po QID, each supplement provides 350 kcal and 20 grams of protein ?-MVI with minerals daily ?-500 mg vitamin C BID ?-220 mg zinc sulfate daily x 14 days ? ?NUTRITION DIAGNOSIS:  ? ?Increased nutrient needs related to cancer and cancer related treatments as evidenced by estimated needs. ? ?GOAL:  ? ?Patient will meet greater than or equal to 90% of their needs ? ?MONITOR:  ? ?PO intake, Supplement acceptance, Labs, Weight trends, Skin, I & O's ? ?REASON FOR ASSESSMENT:  ? ?Consult ?Assessment of nutrition requirement/status ? ?ASSESSMENT:  ? ?Randall Valencia is a 61 y.o. 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 ? ?Pt admitted with ischemic lower extremity.  ? ?Reviewed I/O's: 389 ml x 24 hours ? ?UOP: 1 L x 24 hours ? ?Pt unavailable at time of visit. Attempted to speak with pt via call to hospital room phone, however, unable to reach. RD unable to obtain further nutrition-related history or complete nutrition-focused physical exam at this time.   ? ?Pt with poor oral intake. Noted meal completions 10%.  ? ?Reviewed wt hx; pt has experienced a 8.8% wt loss over the past 10 months, which is not significant for time frame.  ? ?Per H&P, pt no longer desires to undergo chemo or radiation. Palliative care has been consulted to discuss goals of care.  ?  ?Medications reviewed and include colace and lactated ringers infusion @ 100 ml/hr. ? ?No results found for: HGBA1C PTA DM medications are none.  ? ?Labs reviewed: Na:  134, CBGS: 109 (inpatient orders for glycemic control are none).   ? ?Diet Order:   ?Diet Order   ? ?       ?  Diet regular Room service appropriate? Yes; Fluid consistency: Thin  Diet effective now       ?  ? ?  ?  ? ?  ? ? ?EDUCATION NEEDS:  ? ?No education needs have been identified at this time ? ?Skin:  Skin Assessment: Skin Integrity Issues: ?Skin Integrity Issues:: Stage III ?Stage III: sacrum ? ?Last BM:  Unknown ? ?Height:  ? ?Ht Readings from Last 1 Encounters:  ?02/21/22 6\' 2"  (1.88 m)  ? ? ?Weight:  ? ?Wt Readings from Last 1 Encounters:  ?03/16/22 56.9 kg  ? ? ?Ideal Body Weight:  86.4 kg ? ?BMI:  Body mass index is 16.11 kg/m?. ? ?Estimated Nutritional Needs:  ? ?Kcal:  2250-2450 ? ?Protein:  125-140 grams ? ?Fluid:  > 2 L ? ? ? ?Loistine Chance, RD, LDN, CDCES ?Registered Dietitian II ?Certified Diabetes Care and Education Specialist ?Please refer to [facility] for RD and/or RD on-call/weekend/after hours pager  ?

## 2022-03-16 NOTE — Consult Note (Signed)
? ?                                                                                ?Consultation Note ?Date: 03/16/2022  ? ?Patient Name: Randall Valencia  ?DOB: 1961-11-01  MRN: [id number]  Age / Sex: 60 y.o., male  ?PCP: Patient, No Pcp Per (Inactive) ?Referring Physician: Shelly Coss, MD ? ?Reason for Consultation:  goals of care ? ?HPI/Patient Profile: 61 y.o. male  with past medical history of lung cancer with mets to spine and brain, possible pancreatic mass, has been receiving pembrolizumab at [facility], CT scan on 3/18 showed recurrent malignant pulmonary disease as well as concerns for pancreatic mass. He has as completed radiation to his spinal lesion (had pain relief, but did not have any neurologic improvement) admitted on 03/15/2022 with lower limb ischemia which has improved with heparin. Palliative medicine consulted due to patient stating he wanted no further treatments for his cancer.  ? ?Primary Decision Maker ?PATIENT ? ?Discussion: ?Chart was reviewed including labs, imaging, progress notes and Care Everywhere documents and results.  ?Evaluated patient at bedside. He was awake and oriented times three however, limited into his medical condition.  ?He lives at home with his Mom and brother. Per chart review his mom has dementia. ?He tells me he has had radiation and expects that all of his tumors will dissolve in 6-9 weeks per his oncologist.  ?I attempted to discuss his lung cancer and possible trajectories. ?Randall Valencia expectations are that he will continue to improve. We discussed that he has a stage IV cancer that is incurable and the importance in discussing his end of life wishes and attempting to alleviate his possible suffering.  ?He shared that he did not wish to discuss that with anyone except his oncologist. Offered referral to oncologist during his admission and he declined.  ?Attempted to discuss code status, goals of care and preference for surrogate decision making should his condition worsen.  He states that his brother is his surrogate, and he has an HCPOA- but he is not sure where it is and it is not on his chart. He denied the possibility that his condition would worsen, and in fact does not believe that he still has cancer. He did not want to further engage in discussion. ? ?SUMMARY OF RECOMMENDATIONS ?-Stage IV lung cancer- [place] appears to be in severe denial about his condition- he states that his oncologist has not told him that his condition is terminal- per chart review- looks as though he was told his cancer was "treatable, but not curable"- he would likely benefit from a more direct conversation from his own oncologist- he was not receptive to any information I offered to provide him regarding his condition ?-PMT will not continue to follow as patient is not cooperative with discussion and does not show any desire to engage in advanced care planning or goals of care discussions  ?-Recommend he continues to follow at [facility] with his Palliative provider that he sees there  ? ?Code Status/Advance Care Planning: ?Full code ? ? ?Prognosis:   ?Unable to determine ? ?Discharge Planning: To Be Determined ? ?Primary Diagnoses: ?Present on Admission: ? 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 AKI (acute kidney injury) ([place]) ? Squamous cell carcinoma of lung ([place]) ? GERD (gastroesophageal reflux disease) ? Acquired hypothyroidism ? ? ?Review of Systems ? ?Physical Exam ? ?Vital Signs: BP 109/71 (BP Location: Left Arm)   Pulse 95   Temp 99.6 ?F (37.6 ?C) (Oral)   Resp 20   Wt 56.9 kg   SpO2 97%   BMI 16.11 kg/m?  ?Pain Scale: 0-10 ?  ?Pain Score: 3  ? ? ?SpO2: SpO2: 97 % ?O2 Device:SpO2: 97 % ?O2 Flow Rate: .O2 Flow Rate (L/min): 4 L/min ? ?IO: Intake/output summary:  ?Intake/Output Summary (Last 24 hours) at 03/16/2022 1529 ?Last data filed at 03/16/2022 1500 ?Gross per 24 hour  ?Intake 1628.88 ml  ?Output 1750 ml  ?Net -121.12 ml  ? ? ?LBM: Last BM Date :  (per pt "a month  ago") ?Baseline Weight: Weight: 56.9 kg ?Most recent weight: Weight: 56.9 kg     ? ? ? ?Thank you for this consult. Palliative medicine will continue to follow and assist as needed.  ? ?Greater than 50%  of this time was spent counseling and coordinating care related to the above assessment and plan. ? ?Signed by: ?Randall Valencia, AGNP-C ?Palliative Medicine ? ?  ?Please contact Palliative Medicine Team phone at [phone] for questions and concerns.  ?For individual provider: See Amion ? ? ? ? ? ? ? ? ? ? ? ? ? ? ?

## 2022-03-16 NOTE — Assessment & Plan Note (Signed)
?#)   Ischemic left lower extremity: The context of prior concern for left lower extremity ischemia interval suboptimal compliance on amended Xarelto, as further detailed above, distal pulses associated left lower extremity this evening were found to be nonpalpable and not dopplerable, resulting in CT angiography of the abdominal aorta with iliofemoral runoff which showed normal-appearing on the right, while demonstrating filling defects within the distal superficial femoral artery on the left as well as at the level of the popliteal trifurcation consistent with emboli, and resulting in no evidence of runoff on the left distal to these findings.  ? ?case and these imaging results were d/w the on-call vascular surgeon, Dr. Unk Lightning, Who recommends admission to the hospitalist service at [facility] for further evaluation and ischemic left lower extremity.  Dr. Unk Lightning to formally consult and recommends interval initiation of heparin drip, with the possibility of ensuing amputation procedure.  Heparin bolus followed by heparin drip subsequently initiated to [facility] long ED this evening. ? ? ?Plan: Continue heparin drip, as above.  Vascular surgery consulted.  Admitting to [facility] n.p.o. pending further vascular surgery evaluation.  Holding home Xarelto.  Repeat CBC in the morning.  As needed IV Dilaudid.  Continuous IV fluids to reduce any contribution from supply/demand mismatch as a consequence of hypovolemia. ? ? ? ?

## 2022-03-16 NOTE — ED Notes (Signed)
Patient c/o pain with movement.  Medicated prior to transport. ?

## 2022-03-16 NOTE — ED Notes (Signed)
[facility] called and made aware of need for transport ?

## 2022-03-16 NOTE — Assessment & Plan Note (Signed)
? ? ? ?#)   acquired hypothyroidism: documented h/o such, on Synthroid as outpatient.  ? ?Plan: In the setting of current n.p.o. status, will hold home Synthroid for now. ? ? ?

## 2022-03-16 NOTE — ED Notes (Signed)
Report given to Regino Schultze, Therapist, sports at [facility].  All questions answered. ?

## 2022-03-16 NOTE — ED Notes (Signed)
Patient returned for CT ?

## 2022-03-16 NOTE — H&P (Signed)
?History and Physical  ? ? ?PLEASE NOTE THAT DRAGON DICTATION SOFTWARE WAS USED IN THE CONSTRUCTION OF THIS NOTE. ? ? ?Randall Valencia JSE:[id number] DOB: December 03, 1961 DOA: 03/15/2022 ? ?PCP: Patient, No Pcp Per (Inactive) (will further assess) ?Patient coming from: home  ? ?I have personally briefly reviewed patient's old medical records in [place] ? ?Chief Complaint: Abdominal pain ? ?HPI: Randall Valencia is a 61 y.o. 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  ? ?In the setting of squamous cell lung cancer with metastasis to the vertebrae resulting in spinal cord compression the patient has a history of urinary retention.  Over the last 3 to 4 days, he notes diminished urine output with progressive generalized abdominal discomfort and ensuing distention.  No preceding trauma.  Describes the abdominal pain is diffuse in nature, and crampy in quality, without radiation into the back.  It is progressed in intensity over the last 2 to 3 days, and is now constant.  Denies any associated diarrhea, melena, or ageusia.  Reports most recent bowel movement occurred 2 days ago, that he continues to pass flatus.  Not associate with any nausea, vomiting, or rash.  Denies any subjective fever, chills, rigors, or generalized myalgias.  Over the last few days, in the setting of progressive abdominal distention a decline in oral intake of both food and water over that timeframe. ? ?He also notes intermittent discomfort associated with the left lower extremity distal to the left knee.  When most recently admitted to [facility], he  underwent CT angiography of the abdominal aorta with iliofemoral runoff on 02/22/2022, which was reportedly normal.  However, concern for ischemic left lower extremity, was prescribed Xarelto starting at that time.  The patient reports additional Xarelto at his outpatient pharmacy, and is therefore been off since discharged to home from [place].  No interval trauma to the left lower extremity.  He is otherwise on no blood thinners as an outpatient. ? ?Regarding his metastatic squamous cell lung cancer, the patient conveys that he no longer wishes to pursue chemotherapy or radiation, including no additional palliative radiation.  ? ? ? ?ED Course:  ?Vital signs in the ED were notable for the following: Afebrile; initial heart rate 102, which is subsequently proved to 90 following an initiation of; blood pressure 120/90; respiratory rate 16-23, oxygen saturation 98 to 100% on baseline 2 L nasal cannula. ? ?Labs were notable for the following: CMP notable for the following: Sodium 136, BUN 68, creatinine of 1.93 compared to most recent prior serum creatinine data point of 1.03 1623, BUN to creatinine ratio 35, calcium corrected for mild hypoalbuminemia noted to be 9.4, albumin 3.1, otherwise, liver enzymes found to be within normal limits.  Lipase 20.  CBC notable for white blood cell count 6300.  INR 1.0.  Lactate 1.5.  Urinalysis ordered, with result currently pending. ? ?He underwent straight cath in the ED, which produced approximately 1200 cc of urine, following which the patient reported significant improvement in his abdominal discomfort and distention.  ? ?Imaging and additional notable ED work-up: EKG shows sinus rhythm with a rate 93, normal intervals, no evidence of  T wave or ST changes.  Chest x-ray, in comparison to CXR from 02/21/2022 shows stable moderate left basilar atelectasis. ? ?In the setting of nonpalpable and non- dopplerable pulses in the distal left lower extremity, CT angiography of the abdominal  aorta with iliofemoral runoff was performed, and showed normal-appearing on the right, will demonstrating filling defects within the distal superficial femoral artery on the left as well as at the level of the popliteal trifurcation consistent with emboli, and resulting in no evidence of runoff on the left distal to these findings.  ? ?EDP discussed the patient's case and imaging results with the on-call vascular surgeon, Dr. Unk Lightning, Who recommends admission to the hospitalist service at [facility] for further evaluation and ischemic left lower extremity.  Dr. Unk Lightning to formally consult and recommends interval initiation of heparin drip, with the possibility of ensuing amputation procedure.  ? ?While in the ED, the following were administered: Heparin bolus followed by initiation of heparin drip; Dilaudid 0.5 mg IV x1, Zofran 4 mg IV x1, lactated Ringer's x1 L bolus. ? ?Subsequently, the patient was admitted to PCU at [facility] for further evaluation and management of ischemic left lower extremity with presentation also notable for acute kidney injury in the setting of urinary retention.  ? ? ?Review of Systems: As per HPI otherwise 10 point review of systems negative.  ? ?Past Medical History:  ?Diagnosis Date  ? Asthma   ? Cerebral aneurysm   ? Lung cancer ([place])   ? states he also has or has had CA in brain, colon and spinal. states he believes lung is the primary source  ? PUD (peptic ulcer disease)   ? ? ?Past Surgical History:  ?Procedure Laterality Date  ? BRONCHOSCOPY    ? ? ?Social History: ? reports that he quit smoking about 2 years ago. His smoking use included cigarettes. He has never used smokeless tobacco. He reports that he does not currently use alcohol. He reports that he does not currently use drugs. ? ? ?No Known Allergies ? ?Family History  ?Problem Relation Age of Onset  ? Alzheimer's disease Mother   ? Schizophrenia Father   ? Suicidality Father   ? ? ?Family history reviewed and not  pertinent  ? ? ?Prior to Admission medications   ?Medication Sig Start Date End Date Taking? Authorizing Provider  ?dexamethasone (DECADRON) 4 MG tablet Take by mouth. 03/13/22   [provider]  ?feeding supplement, ENSURE ENLIVE, (ENSURE ENLIVE) LIQD Take 237 mLs by mouth 2 (two) times daily between meals. 05/17/19   Bonnell Public, MD  ?levothyroxine (SYNTHROID) 50 MCG tablet Take 50 mcg by mouth daily. 02/13/22   [provider]  ?montelukast (SINGULAIR) 10 MG tablet Take 10 mg by mouth daily. 12/18/21   [provider]  ?morphine (MS CONTIN) 30 MG 12 hr tablet Take 30 mg by mouth 2 (two) times daily as needed for pain. 01/25/22   [provider]  ?naloxone (NARCAN) nasal spray 4 mg/0.1 mL SMARTSIG:Both Nares ?Patient not taking: Reported on 02/22/2022 11/29/21   [provider]  ?omeprazole (PRILOSEC) 40 MG capsule Take 1 capsule (40 mg total) by mouth daily. ?Patient not taking: Reported on 02/22/2022 01/25/22   Levin Erp, PA  ?oxyCODONE (ROXICODONE) 15 MG immediate release tablet Take 15-30 mg by mouth every 4 (four) hours as needed for pain. 01/25/22   [provider]  ?predniSONE (DELTASONE) 10 MG tablet Take 20 mg by mouth daily with breakfast. Taking  2 tablets by mouth once daily.    [provider]  ?pregabalin (LYRICA) 50 MG capsule Take 50 mg by mouth 3 (three) times daily. 01/25/22   [provider]  ?Stann Ore RESPIMAT 1.25 MCG/ACT AERS SMARTSIG:2 Puff(s) Via Inhaler Daily 03/13/22   [provider]  ?SYMBICORT 160-4.5 MCG/ACT inhaler Inhale 2 puffs into the lungs 2 (two) times daily. 01/20/22   [provider]  ?tamsulosin (FLOMAX) 0.4 MG CAPS capsule Take 0.4 mg by mouth daily. 03/11/22   [provider]  ?VENTOLIN HFA 108 (90 Base) MCG/ACT inhaler Inhale 2 puffs into the lungs every 4 (four) hours as needed for shortness of breath or wheezing. 12/18/21   [provider]  ?XARELTO 10 MG TABS  tablet Take 10 mg by mouth daily. 03/08/22   [provider]  ? ? ? ?Objective  ? ? ?Physical Exam: ?Vitals:  ? 03/16/22 0130 03/16/22 0145 03/16/22 0200 03/16/22 0310  ?BP: (!) 134/93 133/90 (!) 136/97 120/

## 2022-03-16 NOTE — Assessment & Plan Note (Signed)
?#)   Acute kidney injury: Presenting serum creatinine noted to be 1.93 compared to most recent prior value of 1.0 on 02/21/2022.  Suspect that this is multifactorial in etiology, with contribution from urinary retention, potentially from known metastatic disease to the vertebra resulting in spinal cord compression, with straight cath this evening rendering 1200 cc urine output.  Potential additional prerenal contribution, noting acute prerenal azotemia on presenting labs, in the 4 dehydration stemming from the patient's report of recent decline in oral intake of the food and water.  Urinalysis with microscopy obtained from aforementioned straight cath has been ordered, with result currently pending.  The patient appears to be prescribed systemic corticosteroids in the setting of his cord compression.  ? ?Plan: Monitor strict I's and O's and daily weights.  Tempt avoid nephrotoxic agents.  Nursing communication order requesting every 4 hour postvoid residual bladder scans with as needed straight cath for postvoid residual scan greater than 400 cc.  Continuous lactated Ringer's.  Follow-up result urinalysis with microscopy.  Add on random urine sodium as well as random urine creatinine.  Repeat CMP in the morning.  In the setting of current n.p.o. status pending further vascular surgery evaluation, holding home systemic corticosteroids for now. ? ?

## 2022-03-16 NOTE — Progress Notes (Signed)
?PROGRESS NOTE ? ?Randall Valencia  TOI:[id number] DOB: Apr 24, 1961 DOA: 03/15/2022 ?PCP: Patient, No Pcp Per (Inactive)  ? ?Brief Narrative: ? ?Patient is a 61 year old male with history of metastatic squamous cell lung cancer, metastasis to brain, vertebra resulting in compression with bilateral lower extremity paralysis, urinary retention, chronic hypoxic respiratory failure on 2 L of oxygen who presented from home with complaint of abdominal pain.  Patient also reported intermittent discomfort on the left lower extremity distal to left knee.  He was recently admitted at [facility], underwent CT angiogram of the abdominal aorta with iliofemoral runoff on 02/22/2022, which was reportedly normal.  However,there was concern for ischemic left lower extremity, was prescribed Xarelto starting at that time,but apparently he hasnot been taking,there is also a report that he signed out AMA from [place].  On presentation he was hemodynamically stable.  Lab work completed and showed AKI with creatinine of 1.9.  CT angiogram aorta, bifemoral showed filling defects within the distal superficial femoral artery on the ?left as well as at the level of the popliteal trifurcation consistent with emboli,distal runoff on the left is not visualized due to these changes.  Vascular surgery consulted. ? ?Assessment & Plan: ? ?Principal Problem: ?  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  Squamous cell carcinoma of lung ([place]) ?  AKI (acute kidney injury) ([place]) ?  GERD (gastroesophageal reflux disease) ?  Acquired hypothyroidism ? ?Ischemic left lower extremity: Presented with discomfort on the left lower extremity.He was recently admitted at [facility], underwent CT angiogram of the abdominal aorta with iliofemoral runoff on 02/22/2022, which was reportedly normal.  However, concern for ischemic left lower extremity, was prescribed Xarelto starting at that time but apparently he hasnot been taking,there is also a report  that he signed out AMA from [place]. ?On presentation he was hemodynamically stable.  Lab work completed and showed AKI with creatinine of 1.9.  CT angiogram aorta, bifemoral showed filling defects within the distal superficial femoral artery on the left as well as at the level of the popliteal trifurcation consistent with emboli,distal runoff on the left is not visualized due to these changes.  Vascular surgery consulted.  Started on heparin drip.  We will report further recommendation from vascular surgery. ?His bilateral lower extremities are cold on touch, no peripheral pulses palpable. ? ?AKI: Baseline creatinine is normal.  There is also report of urinary retention and foley was placed, continue IV fluids today.  Check BMP tomorrow ? ?Metastatic squamous cell lung cancer: Mets to vertebra resulting in spinal cord compression, bilateral lower extremity paralysis, urinary retention.  CT angiogram also showed enhancing mass in the midportion of the pancreas highly suspicious for   neoplasm, T12 lytic lesion, 2 cm right adrenal lesion suspicious for metastatic disease.  Patient is no longer wishing to pursue chemoradiation for the treatment of his cancer.  There is documentation that he is on 2 L of oxygen at home but patient says he is not on oxygen.  We will try to wean the oxygen ? ?Hypothyroidism: Continue Synthyroid ? ?GERD: Continue PPI ? ?Abdominal pain: MRI showed enhancing mass in the midportion of the pancreas highly suspicious for neoplasm.  Denies any abdomen pain during my evaluation.  Abdomen is benign on examination.  Reported that he does not have a bowel movement for a month or so but he does not want to take any bowel regimen saying that he wants to pass stool naturally ? ?Severe protein calorie moderation: Nutritionist  consulted ? ?Goals of care: Completely surprised while I talked about the idea of hospice/palliative approach.  Palliative care consulted. ? ?  ?Pressure Injury 03/16/22 Sacrum  Proximal Stage 3 -  Full thickness tissue loss. Subcutaneous fat may be visible but bone, tendon or muscle are NOT exposed. (Active)  ?03/16/22 4431  ?Location: Sacrum  ?Location Orientation: Proximal  ?Staging: Stage 3 -  Full thickness tissue loss. Subcutaneous fat may be visible but bone, tendon or muscle are NOT exposed.  ?Wound Description (Comments):   ?Present on Admission: Yes  ?Dressing Type Foam - Lift dressing to assess site every shift 03/16/22 0648  ? ? ?DVT prophylaxis:Heparin IV ? ? ?  Code Status: Full Code ? ?Family Communication:  ? ?Patient status:Inpatient ? ?Patient is from :Home ? ?Anticipated discharge VQ:MGQQ ? ?Estimated DC date:Not sure at the moment ? ? ?Consultants: vascular surgery ? ?Procedures:None ? ?Antimicrobials:  ?Anti-infectives (From admission, onward)  ? ? None  ? ?  ? ? ?Subjective: ? ?Patient seen and examined at the bedside this morning.  Hemodynamically stable during my evaluation.  Overall comfortable.  Denies any abdomen pain, nausea or vomiting.  Denies any severe lower extremity pain.  Very deconditioned, chronically looking, debilitated ? ?Objective: ?Vitals:  ? 03/16/22 0600 03/16/22 0648 03/16/22 0700 03/16/22 0744  ?BP: 125/85 116/69  117/82  ?Pulse: 82   86  ?Resp: 12   14  ?Temp: 98.2 ?F (36.8 ?C) 98.5 ?F (36.9 ?C)  98 ?F (36.7 ?C)  ?TempSrc: Oral Oral  Oral  ?SpO2: 100%   100%  ?Weight:   56.9 kg   ? ? ?Intake/Output Summary (Last 24 hours) at 03/16/2022 0810 ?Last data filed at 03/16/2022 0441 ?Gross per 24 hour  ?Intake 1000 ml  ?Output 1000 ml  ?Net 0 ml  ? ?Filed Weights  ? 03/16/22 0700  ?Weight: 56.9 kg  ? ? ?Examination: ? ?General exam: Extremely malnourished, deconditioned, chronically ill looking ?HEENT: PERRL ?Respiratory system:  no wheezes or crackles  ?Cardiovascular system: S1 & S2 heard, RRR.  ?Gastrointestinal system: Abdomen is nondistended, soft and nontender. ?Central nervous system: Alert and oriented ?Extremities: Trace bilateral lower extremity  edema, cold bilateral lower extremity, no pulses palpable, erythematous discoloration on bilateral lower extremities, excoriation/rash ?Skin: Scattered ecchymosis/petechiae ? ? ?Data Reviewed: I have personally reviewed following labs and imaging studies ? ?CBC: ?Recent Labs  ?Lab 03/15/22 ?2355  ?WBC 6.3  ?NEUTROABS 5.7  ?HGB 11.5*  ?HCT 33.1*  ?MCV 96.2  ?PLT 187  ? ?Basic Metabolic Panel: ?Recent Labs  ?Lab 03/15/22 ?2355 03/16/22 ?7619  ?NA 136 134*  ?K 4.6 4.2  ?CL 99 100  ?CO2 27 25  ?GLUCOSE 113* 83  ?BUN 68* 65*  ?CREATININE 1.93* 1.81*  ?CALCIUM 8.6* 7.9*  ?MG  --  2.3  ? ? ? ?No results found for this or any previous visit (from the past 240 hour(s)).  ? ?Radiology Studies: ?CT ANGIO AO+BIFEM W & OR WO CONTRAST ? ?Result Date: 03/16/2022 ?CLINICAL DATA:  Abdominal distension and decreased peripheral pulses EXAM: CT ANGIOGRAPHY OF ABDOMINAL AORTA WITH ILIOFEMORAL RUNOFF TECHNIQUE: Multidetector CT imaging of the abdomen, pelvis and lower extremities was performed using the standard protocol during bolus administration of intravenous contrast. Multiplanar CT image reconstructions and MIPs were obtained to evaluate the vascular anatomy. RADIATION DOSE REDUCTION: This exam was performed according to the departmental dose-optimization program which includes automated exposure control, adjustment of the mA and/or kV according to patient size and/or use of iterative  reconstruction technique. CONTRAST:  127mL OMNIPAQUE IOHEXOL 350 MG/ML SOLN COMPARISON:  Similar exam from 02/21/2022 FINDINGS: VASCULAR Aorta: Abdominal aorta demonstrates minimal atherosclerotic change without aneurysmal dilatation or dissection. Celiac: Patent without evidence of aneurysm, dissection, vasculitis or significant stenosis. SMA: Patent without evidence of aneurysm, dissection, vasculitis or significant stenosis. Renals: Dual renal arteries on the right with single renal artery on the left. No focal stenosis is noted. IMA: Patent without  evidence of aneurysm, dissection, vasculitis or significant stenosis. RIGHT Lower Extremity Inflow: Iliacs demonstrate mild atherosclerotic calcification without aneurysmal dilatation or stenosis. Runoff:

## 2022-03-16 NOTE — Progress Notes (Signed)
ANTICOAGULATION CONSULT NOTE ? ?Pharmacy Consult for heparin ?Indication: DVT ? ?No Known Allergies ? ?Patient Measurements: ?Weight: 56.9 kg (125 lb 7.1 oz) ?Heparin Dosing Weight: 62.6kg ? ?Vital Signs: ?Temp: 98 ?F (36.7 ?C) (04/08 0744) ?Temp Source: Oral (04/08 0744) ?BP: 117/82 (04/08 0744) ?Pulse Rate: 86 (04/08 0744) ? ?Labs: ?Recent Labs  ?  03/15/22 ?2355 03/16/22 ?0350 03/16/22 ?0934  ?HGB 11.5*  --   --   ?HCT 33.1*  --   --   ?PLT 187  --   --   ?LABPROT 12.9  --   --   ?INR 1.0  --   --   ?HEPARINUNFRC  --   --  0.17*  ?CREATININE 1.93* 1.81*  --   ? ? ? ?Estimated Creatinine Clearance: 34.1 mL/min (A) (by C-G formula based on SCr of 1.81 mg/dL (H)). ? ? ?Assessment: ?27 YOM with history of metastatic SCLC with mets to the brain, spine with spinal cord compression and bilateral LE paralysis, chronic opiate-induced constipation, and history of homelessness who presents via EMS from family members home with concern for abdominal pain.  Per chart review pt was recently admitted at [facility] with concern for complications malignancy. Concern at that time for ischemia in bilateral LE, started on anticoagulation with reperfusion clinically.  At that time recommended discharge to SNF but patient refused and left AMA.  States he has not had any anticoagulation since that time. Pharmacy to dose heparin drip for arterial emobli in the LLE, ischemic leg. ? ?Heparin level sub-therapeutic at 0.17 units/mL on 1000 units/hr.  No issue with infusion nor bleeding per discussion with RN. ? ?Goal of Therapy:  ?Heparin level 0.3-0.7 units/ml ?Monitor platelets by anticoagulation protocol: Yes ?  ?Plan:  ?Heparin bolus 1500 units, then ?Increase heparin drip to 1200 units/hr ?Check 6 hr heparin level ?Daily heparin level and CBC ? ?Verneal Wiers D. Mina Marble, PharmD, BCPS, BCCCP ?03/16/2022, 12:45 PM ? ?

## 2022-03-16 NOTE — Consult Note (Signed)
?Hospital Consult ? ? ? ?Reason for Consult: Concern for left lower extremity ischemia ?Requesting Physician: Hospital medicine ?MRN #:  [id number] ? ?History of Present Illness: This is a 61 y.o. male with known metastatic squamous cell carcinoma of the lung with brain mets, vertebral mets with spinal cord compression leading to paralysis, urinary retention, COPD, chronic oxygen 2 L. ? ?Patient was recently seen at [facility] due to concern for left lower extremity ischemia.  He left AMA and was discharged on Xarelto.  He presented to [facility] after appreciating discoloration in the left leg.  He has not been taking his Xarelto. ? ?Imaging at [facility] demonstrated SFA/pop occlusion with no reconstitution distally.  Vascular surgery was called for further management.  He was transferred to [facility] for further care. ? ?On exam, Zakkary was doing well this morning, he continues to have some sensation in bilateral lower extremities, however no motor due to the metastatic lesions.  Since starting heparin, he stated the discoloration in his left leg had improved.  ? ?Patient currently nonambulatory, noted some claudication prior to losing the ability to ambulate, no rest pain, small wound on his left second toe, no previous issues with wound healing ? ?Past Medical History:  ?Diagnosis Date  ? Asthma   ? Cerebral aneurysm   ? Lung cancer ([place])   ? states he also has or has had CA in brain, colon and spinal. states he believes lung is the primary source  ? PUD (peptic ulcer disease)   ? ? ?Past Surgical History:  ?Procedure Laterality Date  ? BRONCHOSCOPY    ? ? ?No Known Allergies ? ?Prior to Admission medications   ?Medication Sig Start Date End Date Taking? Authorizing Provider  ?dexamethasone (DECADRON) 4 MG tablet Take by mouth. 03/13/22   [provider]  ?feeding supplement, ENSURE ENLIVE, (ENSURE ENLIVE) LIQD Take 237 mLs by mouth 2 (two) times daily between meals. 05/17/19   Bonnell Public, MD   ?levothyroxine (SYNTHROID) 50 MCG tablet Take 50 mcg by mouth daily. 02/13/22   [provider]  ?montelukast (SINGULAIR) 10 MG tablet Take 10 mg by mouth daily. 12/18/21   [provider]  ?morphine (MS CONTIN) 30 MG 12 hr tablet Take 30 mg by mouth 2 (two) times daily as needed for pain. 01/25/22   [provider]  ?naloxone (NARCAN) nasal spray 4 mg/0.1 mL SMARTSIG:Both Nares ?Patient not taking: Reported on 02/22/2022 11/29/21   [provider]  ?omeprazole (PRILOSEC) 40 MG capsule Take 1 capsule (40 mg total) by mouth daily. ?Patient not taking: Reported on 02/22/2022 01/25/22   Levin Erp, PA  ?oxyCODONE (ROXICODONE) 15 MG immediate release tablet Take 15-30 mg by mouth every 4 (four) hours as needed for pain. 01/25/22   [provider]  ?predniSONE (DELTASONE) 10 MG tablet Take 20 mg by mouth daily with breakfast. Taking 2 tablets by mouth once daily.    [provider]  ?pregabalin (LYRICA) 50 MG capsule Take 50 mg by mouth 3 (three) times daily. 01/25/22   [provider]  ?Stann Ore RESPIMAT 1.25 MCG/ACT AERS SMARTSIG:2 Puff(s) Via Inhaler Daily 03/13/22   [provider]  ?SYMBICORT 160-4.5 MCG/ACT inhaler Inhale 2 puffs into the lungs 2 (two) times daily. 01/20/22   [provider]  ?tamsulosin (FLOMAX) 0.4 MG CAPS capsule Take 0.4 mg by mouth daily. 03/11/22   [provider]  ?VENTOLIN HFA 108 (90 Base) MCG/ACT inhaler Inhale 2 puffs into the lungs every  4 (four) hours as needed for shortness of breath or wheezing. 12/18/21   [provider]  ?XARELTO 10 MG TABS tablet Take 10 mg by mouth daily. 03/08/22   [provider]  ? ? ?Social History  ? ?Socioeconomic History  ? Marital status: Divorced  ?  Spouse name: Not on file  ? Number of children: 5  ? Years of education: Not on file  ? Highest education level: Not on file  ?Occupational History  ? Occupation: disability  ?Tobacco Use  ? Smoking  status: Former  ?  Years: 45.00  ?  Types: Cigarettes  ?  Quit date: 12/10/2019  ?  Years since quitting: 2.2  ? Smokeless tobacco: Never  ?Vaping Use  ? Vaping Use: Never used  ?Substance and Sexual Activity  ? Alcohol use: Not Currently  ? Drug use: Not Currently  ? Sexual activity: Not on file  ?Other Topics Concern  ? Not on file  ?Social History Narrative  ? Not on file  ? ?Social Determinants of Health  ? ?Financial Resource Strain: Not on file  ?Food Insecurity: Not on file  ?Transportation Needs: Not on file  ?Physical Activity: Not on file  ?Stress: Not on file  ?Social Connections: Not on file  ?Intimate Partner Violence: Not on file  ? ?Family History  ?Problem Relation Age of Onset  ? Alzheimer's disease Mother   ? Schizophrenia Father   ? Suicidality Father   ? ? ?ROS: Otherwise negative unless mentioned in HPI ? ?Physical Examination ? ?Vitals:  ? 03/16/22 0744 03/16/22 1144  ?BP: 117/82 98/68  ?Pulse: 86 89  ?Resp: 14 16  ?Temp: 98 ?F (36.7 ?C) 97.9 ?F (36.6 ?C)  ?SpO2: 100% 97%  ? ?Body mass index is 16.11 kg/m?. ? ?General:  WDWN in NAD ?Gait: Not observed ?HENT: WNL, normocephalic ?Pulmonary: non-labored breathing ?Cardiac: regula ?Abdomen:  soft ?Skin: without rashes ?Vascular Exam/Pulses: On the right, multiphasic PT, monophasic DP, on the left, multiphasic DP and posterior tibial artery ?Extremities: without ischemic changes, without Gangrene , without cellulitis; without open wounds -mild ulceration on the second toe. ?Musculoskeletal: no muscle wasting or atrophy  ?Neurologic: A&O X 3;  No focal weakness or paresthesias are detected; speech is fluent/normal ?Psychiatric:  The pt has Normal affect.  In denial regarding current prognosis ?Lymph:  Unremarkable ? ?CBC ?   ?Component Value Date/Time  ? WBC 6.3 03/15/2022 2355  ? RBC 3.44 (L) 03/15/2022 2355  ? HGB 11.5 (L) 03/15/2022 2355  ? HCT 33.1 (L) 03/15/2022 2355  ? PLT 187 03/15/2022 2355  ? MCV 96.2 03/15/2022 2355  ? MCH 33.4 03/15/2022  2355  ? MCHC 34.7 03/15/2022 2355  ? RDW 17.0 (H) 03/15/2022 2355  ? LYMPHSABS 0.2 (L) 03/15/2022 2355  ? MONOABS 0.3 03/15/2022 2355  ? EOSABS 0.0 03/15/2022 2355  ? BASOSABS 0.0 03/15/2022 2355  ? ? ?BMET ?   ?Component Value Date/Time  ? NA 134 (L) 03/16/2022 0558  ? K 4.2 03/16/2022 0558  ? CL 100 03/16/2022 0558  ? CO2 25 03/16/2022 0558  ? GLUCOSE 83 03/16/2022 0558  ? BUN 65 (H) 03/16/2022 0558  ? CREATININE 1.81 (H) 03/16/2022 0558  ? CALCIUM 7.9 (L) 03/16/2022 0558  ? GFRNONAA 42 (L) 03/16/2022 0558  ? GFRAA >60 05/17/2019 0924  ? ? ?COAGS: ?Lab Results  ?Component Value Date  ? INR 1.0 03/15/2022  ? INR 0.9 02/21/2022  ? ? ? ?Non-Invasive Vascular Imaging:   ?See CT ? ?  ASSESSMENT/PLAN: This is a 61 y.o. male with metastatic lung cancer involving the brain and spinal cord.  The spinal cord lesion has compressive symptoms causing bilateral lower extremity paralysis.  Patient presented with discoloration in the left leg, which improved with heparin.  On physical exam, the foot appeared warm and well-perfused.  There is a small wound, but the patient has multiphasic signals in both the dorsalis pedis and posterior tibial artery.  Tomoki has a degree of critical limb ischemia, but does not have acute limb ischemia at this time.  I had a long conversation with him regarding the above.  Torry is in denial regarding his current metastatic lung cancer prognosis.  Being that he is nonambulatory, he is not a candidate for revascularization at this time. ? ?Sosaia is undergoing therapy at [facility], and plans to follow-up with her vascular surgery group.  I asked that he start taking his Xarelto.  I have ordered an ABI to define the degree of peripheral arterial disease.  This will be followed up. ? ? ?No plans for follow-up here at [facility] as he is established in the [place] system, and would like to return there as the majority of his care takes place in [place]. ? ? ?J. Melene Muller MD MS ?[facility] ?[phone] ?03/16/2022  ?12:08 PM ? ?

## 2022-03-16 NOTE — Progress Notes (Signed)
ANTICOAGULATION CONSULT NOTE - Initial Consult ? ?Pharmacy Consult for heparin ?Indication: DVT ? ?No Known Allergies ? ?Patient Measurements: ?  ?Heparin Dosing Weight: 62.6kg ? ?Vital Signs: ?Temp: 97.6 ?F (36.4 ?C) (04/07 2210) ?Temp Source: Oral (04/07 2210) ?BP: 136/97 (04/08 0200) ?Pulse Rate: 90 (04/08 0200) ? ?Labs: ?Recent Labs  ?  03/15/22 ?2355  ?HGB 11.5*  ?HCT 33.1*  ?PLT 187  ?LABPROT 12.9  ?INR 1.0  ?CREATININE 1.93*  ? ? ?CrCl cannot be calculated (Unknown ideal weight.). ? ? ?Medical History: ?Past Medical History:  ?Diagnosis Date  ? Asthma   ? Cerebral aneurysm   ? Lung cancer ([place])   ? states he also has or has had CA in brain, colon and spinal. states he believes lung is the primary source  ? PUD (peptic ulcer disease)   ? ? ?Assessment: ? 61 y.o. 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  Per chart review pt was recently admitted at [facility] with concern for complications malignancy. Concern at that time for ischemia in bilateral LE, started on anticoagulation with reperfusion clinically.  At that time recommended discharge to SNF but patient refused and left AMA.  States he has not had any anticoagulation since that time. Pharmacy to dose heparin drip for arterial emobli in the LLE, ischemic leg. ? ?CBC WNL, INR 1.0, aPTT 12.9, Scr 1.93 ? ?Goal of Therapy:  ?Heparin level 0.3-0.7 units/ml ?Monitor platelets by anticoagulation protocol: Yes ?  ?Plan:  ?Heparin bolus 4000 units x 1 ? Start heparin drip at 1000 units/hr ?Heparin level in 6 hours ?Daily CBC ? ?Dolly Rias RPh ?03/16/2022, 2:58 AM ? ?

## 2022-03-16 NOTE — ED Notes (Signed)
Patient transported to [facility] via [facility] at this time.  All personal belongings transported with patient.  ?

## 2022-03-17 ENCOUNTER — Inpatient Hospital Stay (HOSPITAL_COMMUNITY): Payer: Medicaid Other

## 2022-03-17 DIAGNOSIS — L0390 Cellulitis, unspecified: Secondary | ICD-10-CM

## 2022-03-17 DIAGNOSIS — I739 Peripheral vascular disease, unspecified: Secondary | ICD-10-CM

## 2022-03-17 LAB — CBC
HCT: 26.6 % — ABNORMAL LOW (ref 39.0–52.0)
Hemoglobin: 9 g/dL — ABNORMAL LOW (ref 13.0–17.0)
MCH: 32.8 pg (ref 26.0–34.0)
MCHC: 33.8 g/dL (ref 30.0–36.0)
MCV: 97.1 fL (ref 80.0–100.0)
Platelets: 136 10*3/uL — ABNORMAL LOW (ref 150–400)
RBC: 2.74 MIL/uL — ABNORMAL LOW (ref 4.22–5.81)
RDW: 16.6 % — ABNORMAL HIGH (ref 11.5–15.5)
WBC: 6 10*3/uL (ref 4.0–10.5)
nRBC: 0 % (ref 0.0–0.2)

## 2022-03-17 LAB — URINE CULTURE: Culture: NO GROWTH

## 2022-03-17 LAB — BASIC METABOLIC PANEL
Anion gap: 7 (ref 5–15)
BUN: 38 mg/dL — ABNORMAL HIGH (ref 8–23)
CO2: 25 mmol/L (ref 22–32)
Calcium: 7.8 mg/dL — ABNORMAL LOW (ref 8.9–10.3)
Chloride: 103 mmol/L (ref 98–111)
Creatinine, Ser: 1.11 mg/dL (ref 0.61–1.24)
GFR, Estimated: >60 mL/min (ref >60)
Glucose, Bld: 77 mg/dL (ref 70–99)
Potassium: 4.1 mmol/L (ref 3.5–5.1)
Sodium: 135 mmol/L (ref 135–145)

## 2022-03-17 LAB — SODIUM, URINE, RANDOM: Sodium, Ur: 32 mmol/L

## 2022-03-17 LAB — CREATININE, URINE, RANDOM: Creatinine, Urine: 103.07 mg/dL

## 2022-03-17 LAB — HEPARIN LEVEL (UNFRACTIONATED): Heparin Unfractionated: 0.37 IU/mL (ref 0.30–0.70)

## 2022-03-17 MED ORDER — ALBUTEROL SULFATE (2.5 MG/3ML) 0.083% IN NEBU: 3.0000 mL | INHALATION_SOLUTION | Freq: Four times a day (QID) | RESPIRATORY_TRACT | Status: DC | PRN

## 2022-03-17 MED ORDER — CHLORHEXIDINE GLUCONATE CLOTH 2 % EX PADS
6.0000 | MEDICATED_PAD | Freq: Every day | CUTANEOUS | Status: DC
  Administered 2022-03-17: 6 via TOPICAL

## 2022-03-17 MED ORDER — MINERAL OIL RE ENEM
1.0000 | ENEMA | Freq: Once | RECTAL | Status: DC
  Filled 2022-03-17: qty 1

## 2022-03-17 MED ORDER — SENNA 8.6 MG PO TABS
1.0000 | ORAL_TABLET | Freq: Two times a day (BID) | ORAL | Status: DC
  Administered 2022-03-17: 8.6 mg via ORAL

## 2022-03-17 MED ORDER — POLYETHYLENE GLYCOL 3350 17 G PO PACK: 17.0000 g | PACK | Freq: Every day | ORAL | Status: DC

## 2022-03-17 NOTE — Progress Notes (Signed)
PT Cancellation Note ? ?Patient Details ?Name: Randall Valencia ?MRN: [id number] ?DOB: Feb 28, 1961 ? ? ?Cancelled Treatment:    Reason Eval/Treat Not Completed: Patient declined, no reason specified. Pt reports he is not working with therapy until he is able to have a bowel movement. Pt reports he has not had a bowel movement in a month. PT attempts to provide education on the need for mobility to stimulate his bowels, as well as the possible need to initiate a bowel program due to his spinal cord compression, however the pt is not accepting of this therapist's education. Pt also reports he is going to rehab after the hospital. PT informs the patient would likely need a PT evaluation and recommendation to do this. Pt reports he will go private pay if needed, continuing to refuse all mobility. PT will attempt to follow up tomorrow to see if the patient is more agreeable. ? ? ?Zenaida Niece ?03/17/2022, 11:12 AM ?

## 2022-03-17 NOTE — Progress Notes (Signed)
OT Cancellation Note ? ?Patient Details ?Name: Randall Valencia ?MRN: [id number] ?DOB: Jun 21, 1961 ? ? ?Cancelled Treatment:    Reason Eval/Treat Not Completed: Patient declined, no reason specified (Pt refusing therapies until after he moves his bowels. OT evaluation to continue efforts as pt allows) ? ?Kentrell Guettler A [place] ?03/17/2022, 11:32 AM ?

## 2022-03-17 NOTE — Progress Notes (Signed)
VASCULAR LAB ? ? ? ?ABIs have been performed. ? ?See CV proc for preliminary results. ? ? ?Sharion Dove, RVT ?03/17/2022, 3:39 PM ? ?

## 2022-03-17 NOTE — Progress Notes (Addendum)
ANTICOAGULATION CONSULT NOTE ? ?Pharmacy Consult for heparin ?Indication: DVT ? ?No Known Allergies ? ?Patient Measurements: ?Weight: 56.9 kg (125 lb 7.1 oz) ?Heparin Dosing Weight: 62.6kg ? ?Vital Signs: ?Temp: 98.3 ?F (36.8 ?C) (04/09 1140) ?Temp Source: Oral (04/09 1140) ?BP: 102/67 (04/09 1140) ?Pulse Rate: 99 (04/09 1140) ? ?Labs: ?Recent Labs  ?  03/15/22 ?2355 03/16/22 ?9326 03/16/22 ?7124 03/16/22 ?1756 03/17/22 ?0411  ?HGB 11.5*  --   --   --  9.0*  ?HCT 33.1*  --   --   --  26.6*  ?PLT 187  --   --   --  136*  ?LABPROT 12.9  --   --   --   --   ?INR 1.0  --   --   --   --   ?HEPARINUNFRC  --   --  0.17* 0.22* 0.37  ?CREATININE 1.93* 1.81*  --   --  1.11  ? ? ? ?Estimated Creatinine Clearance: 55.5 mL/min (by C-G formula based on SCr of 1.11 mg/dL). ? ? ?Assessment: ?3 YOM with history of metastatic SCLC with mets to the brain, spine with spinal cord compression and bilateral LE paralysis, chronic opiate-induced constipation, and history of homelessness who presents via EMS from family members home with concern for abdominal pain.  Per chart review pt was recently admitted at [facility] with concern for complications malignancy. Concern at that time for ischemia in bilateral LE, started on anticoagulation with reperfusion clinically.  At that time recommended discharge to SNF but patient refused and left AMA.  States he has not had any anticoagulation since that time. Pharmacy to dose heparin drip for arterial emobli in the LLE, ischemic leg. ? ?Heparin level therapeutic at 0.37 units/mL on 1400 units/hr.  No issue with infusion nor bleeding per discussion with RN.  Noted hemoglobin and platelet count are trending down, now off IVF. ? ?Goal of Therapy:  ?Heparin level 0.3-0.7 units/ml ?Monitor platelets by anticoagulation protocol: Yes ?  ?Plan:  ?Continue heparin drip at 1400 units/hr ?Daily heparin level and CBC ? ?Diyari Cherne D. Mina Marble, PharmD, BCPS, BCCCP ?03/17/2022, 1:23 PM ? ?

## 2022-03-17 NOTE — Progress Notes (Signed)
?PROGRESS NOTE ? ?Randall Valencia  XKG:[id number] DOB: 12/13/1960 DOA: 03/15/2022 ?PCP: Patient, No Pcp Per (Inactive)  ? ?Brief Narrative: ? ?Patient is a 61 year old male with history of metastatic squamous cell lung cancer, metastasis to brain, vertebra resulting in compression with bilateral lower extremity paralysis, urinary retention, chronic hypoxic respiratory failure on 2 L of oxygen who presented from home with complaint of abdominal pain.  Patient also reported intermittent discomfort on the left lower extremity distal to left knee.  He was recently admitted at [facility], underwent CT angiogram of the abdominal aorta with iliofemoral runoff on 02/22/2022, which was reportedly normal.  However,there was concern for ischemic left lower extremity, was prescribed Xarelto starting at that time,but apparently he hasnot been taking,there is also a report that he signed out AMA from [place].  On presentation he was hemodynamically stable.  Lab work completed and showed AKI with creatinine of 1.9.  CT angiogram aorta, bifemoral showed filling defects within the distal superficial femoral artery on the ?left as well as at the level of the popliteal trifurcation consistent with emboli,distal runoff on the left is not visualized due to these changes.  Vascular surgery consulted, no plan for operative intervention.  ABI pending.  PT/OT consulted ? ?Assessment & Plan: ? ?Principal Problem: ?  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  Squamous cell carcinoma of lung ([place]) ?  AKI (acute kidney injury) ([place]) ?  GERD (gastroesophageal reflux disease) ?  Acquired hypothyroidism ?  Pressure injury of skin ? ?Ischemic left lower extremity: Presented with discomfort on the left lower extremity.He was recently admitted at [facility], underwent CT angiogram of the abdominal aorta with iliofemoral runoff on 02/22/2022, which was reportedly normal.  However, concern for ischemic left lower extremity, was  prescribed Xarelto starting at that time but apparently he hasnot been taking,there is also a report that he signed out AMA from [place]. ?On presentation he was hemodynamically stable.  Lab work completed and showed AKI with creatinine of 1.9.  CT angiogram aorta, bifemoral showed filling defects within the distal superficial femoral artery on the left as well as at the level of the popliteal trifurcation consistent with emboli,distal runoff on the left is not visualized due to these changes.  Vascular surgery consulted.  Started on heparin drip.  Vascular surgery not planning for any intervention, he is not a candidate for revascularization.  Vascular surgery recommended to follow-up with his own vascular surgeon at [facility]. ?ABI has been ordered.  Depending upon the ABI report, we will discontinue heparin drip and start him on Xarelto for discharge planning ? ?AKI/urinary retention: Baseline creatinine is normal.  There is also report of urinary retention and foley was placed.  Kidney function has normalized. ?Most likely he will be discharged with Foley catheter.  He needs to follow-up with urology as an outpatient ? ?Metastatic squamous cell lung cancer: Mets to vertebra resulting in spinal cord compression, bilateral lower extremity paralysis, urinary retention.  CT angiogram also showed enhancing mass in the midportion of the pancreas highly suspicious for   neoplasm, T12 lytic lesion, 2 cm right adrenal lesion suspicious for metastatic disease.  Patient is no longer wishing to pursue chemoradiation for the treatment of his cancer.  There is documentation that he is on 2 L of oxygen at home but patient says he is not on oxygen.  We will try to wean the oxygen ? ?Hypothyroidism: Continue Synthyroid ? ?GERD: Continue PPI ? ?Abdominal pain: MRI showed enhancing  mass in the midportion of the pancreas highly suspicious for neoplasm.  Denies any abdomen pain during my evaluation.  Abdomen is benign on examination.   Reported that he does not have a bowel movement for a month or so but he does not want to take any bowel regimen saying that he wants to pass stool naturally ? ?Severe protein calorie moderation: Nutritionist consulted ? ?Goals of care: Completely surprised while I talked about the idea of hospice/palliative approach.  Palliative care consulted.  He did not engage with conversation so palliative care signed off ? ?Deconditioning/debility: I have requested for PT/OT evaluation.  Patient ambulates with help of wheelchair.  I had a conversation with brother who is not in touch with him that much.  Previously he was living with his mother but now he is living with friends in a house.  He was recommended to go to rehab from [facility] but he denied. ? ?Nutrition Problem: Increased nutrient needs ?Etiology: cancer and cancer related treatments ?Pressure Injury 03/16/22 Sacrum Proximal Stage 3 -  Full thickness tissue loss. Subcutaneous fat may be visible but bone, tendon or muscle are NOT exposed. (Active)  ?03/16/22 7371  ?Location: Sacrum  ?Location Orientation: Proximal  ?Staging: Stage 3 -  Full thickness tissue loss. Subcutaneous fat may be visible but bone, tendon or muscle are NOT exposed.  ?Wound Description (Comments):   ?Present on Admission: Yes  ?Dressing Type Foam - Lift dressing to assess site every shift 03/16/22 2000  ? ? ?DVT prophylaxis:Heparin IV ? ? ?  Code Status: Full Code ? ?Family Communication:  ? ?Patient status:Inpatient ? ?Patient is from :Home ? ?Anticipated discharge GG:YIRS ? ?Estimated DC date:Not sure at the moment ? ? ?Consultants: vascular surgery ? ?Procedures:None ? ?Antimicrobials:  ?Anti-infectives (From admission, onward)  ? ? None  ? ?  ? ? ?Subjective: ? ?Patient seen and examined at the bedside this morning.  Hemodynamically stable during my evaluation.  Sleeping.  He does not want to talk much and was focused on getting breakfast. ? ?Objective: ?Vitals:  ? 03/17/22 0316 03/17/22 0500  03/17/22 0703 03/17/22 0927  ?BP:   104/70   ?Pulse:   85   ?Resp:   13   ?Temp: 98.9 ?F (37.2 ?C)  99.5 ?F (37.5 ?C)   ?TempSrc: Oral  Oral   ?SpO2:   97% 95%  ?Weight:  56.9 kg    ? ? ?Intake/Output Summary (Last 24 hours) at 03/17/2022 1041 ?Last data filed at 03/17/2022 0900 ?Gross per 24 hour  ?Intake 3164.15 ml  ?Output 1925 ml  ?Net 1239.15 ml  ? ?Filed Weights  ? 03/16/22 0700 03/17/22 0500  ?Weight: 56.9 kg 56.9 kg  ? ? ?Examination: ? ?General exam: Extremely malnourished, deconditioned, chronically ill looking ?HEENT: PERRL ?Respiratory system:  no wheezes or crackles  ?Cardiovascular system: S1 & S2 heard, RRR.  ?Gastrointestinal system: Abdomen is nondistended, soft and nontender. ?Central nervous system: Alert and oriented ?Extremities: Trace bilateral lower extremity edema, erythematous discoloration of bilateral legs, excoriation, rash  ?Skin : Scattered ecchymosis/petechiae ? ?CBC: ?Recent Labs  ?Lab 03/15/22 ?2355 03/17/22 ?0411  ?WBC 6.3 6.0  ?NEUTROABS 5.7  --   ?HGB 11.5* 9.0*  ?HCT 33.1* 26.6*  ?MCV 96.2 97.1  ?PLT 187 136*  ? ?Basic Metabolic Panel: ?Recent Labs  ?Lab 03/15/22 ?2355 03/16/22 ?8546 03/17/22 ?0411  ?NA 136 134* 135  ?K 4.6 4.2 4.1  ?CL 99 100 103  ?CO2 27 25 25   ?GLUCOSE 113* 83  77  ?BUN 68* 65* 38*  ?CREATININE 1.93* 1.81* 1.11  ?CALCIUM 8.6* 7.9* 7.8*  ?MG  --  2.3  --   ? ? ? ?Recent Results (from the past 240 hour(s))  ?Blood culture (routine x 2)     Status: None (Preliminary result)  ? Collection Time: 03/15/22 11:57 PM  ? Specimen: BLOOD  ?Result Value Ref Range Status  ? Specimen Description   Final  ?  BLOOD BLOOD RIGHT FOREARM ?Performed at [facility], [place] [address], [place], [place] [address] ?  ? Special Requests   Final  ?  BOTTLES DRAWN AEROBIC AND ANAEROBIC Blood Culture adequate volume ?Performed at [facility], [place] [address], [place], [place] [address] ?  ? Culture   Final  ?  NO GROWTH < 12 HOURS ?Performed at [facility], [place] [address], [place], [place] [address] ?  ? Report Status PENDING  Incomplete  ?Urine Culture     Status: None  ? Collection Time: 03/16/22  3:07 AM  ? Specimen: Urine, Clean Catch  ?Result Value Ref

## 2022-03-17 NOTE — Progress Notes (Signed)
Patient is refusing every  patient care, including all medications, Writer tried educating patient on the consequences of refusing medications and patient care. Patient stated that " we are holding him hostage, that he is ready to walk out of here" ?

## 2022-03-18 ENCOUNTER — Other Ambulatory Visit (HOSPITAL_COMMUNITY): Payer: Self-pay

## 2022-03-18 MED ORDER — SPIRIVA RESPIMAT 1.25 MCG/ACT IN AERS: 2.0000 | INHALATION_SPRAY | Freq: Every day | RESPIRATORY_TRACT | 0 refills | Status: DC

## 2022-03-18 MED ORDER — ALBUTEROL SULFATE HFA 108 (90 BASE) MCG/ACT IN AERS
2.0000 | INHALATION_SPRAY | Freq: Four times a day (QID) | RESPIRATORY_TRACT | 0 refills | Status: AC | PRN
  Filled 2022-03-18: qty 18, 25d supply, fill #0

## 2022-03-18 MED ORDER — TAMSULOSIN HCL 0.4 MG PO CAPS
0.4000 mg | ORAL_CAPSULE | Freq: Every day | ORAL | 1 refills | Status: AC
  Filled 2022-03-18: qty 30, 30d supply, fill #0

## 2022-03-18 MED ORDER — SPIRIVA RESPIMAT 1.25 MCG/ACT IN AERS
2.0000 | INHALATION_SPRAY | Freq: Every day | RESPIRATORY_TRACT | 0 refills | Status: AC
  Filled 2022-03-18: qty 4, 30d supply, fill #0

## 2022-03-18 MED ORDER — RIVAROXABAN 20 MG PO TABS
20.0000 mg | ORAL_TABLET | Freq: Every day | ORAL | Status: DC
  Administered 2022-03-18: 20 mg via ORAL
  Filled 2022-03-18: qty 1

## 2022-03-18 MED ORDER — SYMBICORT 160-4.5 MCG/ACT IN AERO: 2.0000 | INHALATION_SPRAY | Freq: Two times a day (BID) | RESPIRATORY_TRACT | 0 refills | Status: DC

## 2022-03-18 MED ORDER — SENNOSIDES-DOCUSATE SODIUM 8.6-50 MG PO TABS
2.0000 | ORAL_TABLET | Freq: Every day | ORAL | 1 refills | Status: AC
  Filled 2022-03-18: qty 30, 15d supply, fill #0

## 2022-03-18 MED ORDER — SYMBICORT 160-4.5 MCG/ACT IN AERO
2.0000 | INHALATION_SPRAY | Freq: Two times a day (BID) | RESPIRATORY_TRACT | 0 refills | Status: AC
  Filled 2022-03-18: qty 10.2, 30d supply, fill #0

## 2022-03-18 MED ORDER — SENNOSIDES-DOCUSATE SODIUM 8.6-50 MG PO TABS: 2.0000 | ORAL_TABLET | Freq: Every day | ORAL | 1 refills | Status: DC

## 2022-03-18 MED ORDER — ALBUTEROL SULFATE HFA 108 (90 BASE) MCG/ACT IN AERS: 2.0000 | INHALATION_SPRAY | Freq: Four times a day (QID) | RESPIRATORY_TRACT | 0 refills | Status: DC | PRN

## 2022-03-18 MED ORDER — RIVAROXABAN 20 MG PO TABS
20.0000 mg | ORAL_TABLET | Freq: Every day | ORAL | 1 refills | Status: AC
  Filled 2022-03-18: qty 30, 30d supply, fill #0

## 2022-03-18 MED ORDER — THIAMINE HCL 100 MG PO TABS
100.0000 mg | ORAL_TABLET | Freq: Every day | ORAL | 1 refills | Status: AC
  Filled 2022-03-18: qty 30, 30d supply, fill #0

## 2022-03-18 MED ORDER — POLYETHYLENE GLYCOL 3350 17 G PO PACK: 17.0000 g | PACK | Freq: Every day | ORAL | 0 refills | Status: DC

## 2022-03-18 MED ORDER — TAMSULOSIN HCL 0.4 MG PO CAPS: 0.4000 mg | ORAL_CAPSULE | Freq: Every day | ORAL | 1 refills | Status: DC

## 2022-03-18 MED ORDER — POLYETHYLENE GLYCOL 3350 17 GM/SCOOP PO POWD
17.0000 g | Freq: Every day | ORAL | 0 refills | Status: AC
  Filled 2022-03-18: qty 238, 14d supply, fill #0

## 2022-03-18 MED ORDER — THIAMINE HCL 100 MG PO TABS: 100.0000 mg | ORAL_TABLET | Freq: Every day | ORAL | 1 refills | Status: DC

## 2022-03-18 MED ORDER — RIVAROXABAN 20 MG PO TABS: 20.0000 mg | ORAL_TABLET | Freq: Every day | ORAL | 1 refills | Status: DC

## 2022-03-18 NOTE — Discharge Summary (Signed)
Physician Discharge Summary  ?Randall Valencia CHE:[id number] DOB: 07/28/1961 DOA: 03/15/2022 ? ?PCP: Patient, No Pcp Per (Inactive) ? ?Admit date: 03/15/2022 ?Discharge date: 03/18/2022 ? ?Admitted From: Home ?Disposition:  Home ? ?Discharge Condition:Stable ?CODE STATUS:FULL ?Diet recommendation: regular   ? ?Brief/Interim Summary: ? ?Patient is a 61 year old male with history of metastatic squamous cell lung cancer, metastasis to brain, vertebra resulting in compression with bilateral lower extremity paralysis, urinary retention, chronic hypoxic respiratory failure on 2 L of oxygen who presented from home with complaint of abdominal pain.  Patient also reported intermittent discomfort on the left lower extremity distal to left knee.  He was recently admitted at [facility], underwent CT angiogram of the abdominal aorta with iliofemoral runoff on 02/22/2022, which was reportedly normal.  However,there was concern for ischemic left lower extremity, was prescribed Xarelto starting at that time,but apparently he hasnot been taking,there is also a report that he signed out AMA from [place].  On presentation he was hemodynamically stable.  Lab work completed and showed AKI with creatinine of 1.9.  CT angiogram aorta, bifemoral showed filling defects within the distal superficial femoral artery on the ?left as well as at the level of the popliteal trifurcation consistent with emboli,distal runoff on the left is not visualized due to these changes.  Vascular surgery consulted, no plan for operative intervention.  ABI of bilateral lower extremities did not show any significant ischemia.  Patient declined PT/OT evaluation.  He is very noncompliant.  He is medically stable for discharge to home today.  He needs to follow-up with his oncologist, vascular surgeon as an outpatient.  He also needs to follow-up with [facility] for the removal of Foley catheter. ? ?Following problems were addressed during his hospitalization: ? ? ?Ischemic left  lower extremity: Presented with discomfort on the left lower extremity.He was recently admitted at [facility], underwent CT angiogram of the abdominal aorta with iliofemoral runoff on 02/22/2022, which was reportedly normal.  However, concern for ischemic left lower extremity, was prescribed Xarelto starting at that time but apparently he hasnot been taking,there is also a report that he signed out AMA from [place]. ?On presentation he was hemodynamically stable.  Lab work completed and showed AKI with creatinine of 1.9.  CT angiogram aorta, bifemoral showed filling defects within the distal superficial femoral artery on the left as well as at the level of the popliteal trifurcation consistent with emboli,distal runoff on the left is not visualized due to these changes.  Vascular surgery consulted.  Started on heparin drip.  Vascular surgery not planning for any intervention, he is not a candidate for revascularization.  Vascular surgery recommended to follow-up with his own vascular surgeon at [facility]. ?ABI did not show any significant ischemia in bilateral lower extremities.  Restarted on Xarelto ?  ?AKI/urinary retention: Baseline creatinine is normal.  There is also report of urinary retention and foley was placed.  CT imaging had showed over distended bladder.  Kidney function has normalized. ?He will be discharged with Foley catheter.  He needs to follow-up with urology as an outpatient in a week to remove Foley. ?  ?Metastatic squamous cell lung cancer: Mets to vertebra resulting in spinal cord compression, bilateral lower extremity paralysis, urinary retention.  CT angiogram also showed enhancing mass in the midportion of the pancreas highly suspicious for   neoplasm, T12 lytic lesion, 2 cm right adrenal lesion suspicious for metastatic disease.  Patient is no longer wishing to pursue chemoradiation for the treatment of his cancer.  There  is documentation that he is on 2 L of oxygen at home but patient says he is not on  oxygen.  Currently on room air ?  ?Hypothyroidism: Continue Synthyroid ?  ?GERD: Continue PPI ?  ?Abdominal pain: MRI showed enhancing mass in the midportion of the pancreas highly suspicious for neoplasm.  Denies any abdomen pain during my evaluation.  Abdomen is benign on examination.  Started on constipation medications ?  ?Severe protein calorie moderation: Nutritionist consulted ?  ?Goals of care: Completely surprised while I talked about the idea of hospice/palliative approach.  Palliative care consulted.  He did not engage with conversation so palliative care signed off ?  ?Deconditioning/debility: I  requested for PT/OT evaluation, but declined evaluation.  Patient ambulates with help of wheelchair.  I had a conversation with brother who is not in touch with him that much.  Previously he was living with his mother but now he is living with friends in a house.  He was recommended to go to rehab from [facility] but he denied. ?  ? ?Discharge Diagnoses:  ?Principal Problem: ?  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  Squamous cell carcinoma of lung ([place]) ?  AKI (acute kidney injury) ([place]) ?  GERD (gastroesophageal reflux disease) ?  Acquired hypothyroidism ?  Pressure injury of skin ? ? ? ?Discharge Instructions ? ?Discharge Instructions   ? ? Diet general   Complete by: As directed ?  ? Discharge instructions   Complete by: As directed ?  ? 1)Please take prescribed medications as instructed ?2)Follow up with your PCP in a week ?3)Follow up with [facility] in a week to take the Foley out.  Name and number the provider group has been attached ?4)Follow up with your oncologist and vascular surgeon at [facility]  ? Increase activity slowly   Complete by: As directed ?  ? No wound care   Complete by: As directed ?  ? ?  ? ?Allergies as of 03/18/2022   ?No Known Allergies ?  ? ?  ?Medication List  ?  ? ?STOP taking these medications   ? ?dexamethasone 2 MG tablet ?Commonly known as:  DECADRON ?  ?morphine 30 MG 12 hr tablet ?Commonly known as: MS CONTIN ?  ?omeprazole 40 MG capsule ?Commonly known as: PRILOSEC ?  ? ?  ? ?TAKE these medications   ? ?albuterol 108 (90 Base) MCG/ACT inhaler ?Commonly known as: VENTOLIN HFA ?Inhale 2 puffs into the lungs every 6 (six) hours as needed for wheezing or shortness of breath. ?What changed: when to take this ?  ?aspirin 81 MG chewable tablet ?Chew 81 mg by mouth daily. ?  ?bisacodyl 10 MG suppository ?Commonly known as: DULCOLAX ?Place 10 mg rectally daily. ?  ?Cholecalciferol 1.25 MG (50000 UT) capsule ?Take 50,000 Units by mouth once a week. Also takes 2,000 units daily ?  ?Cholecalciferol 50 MCG (2000 UT) Tabs ?Take 2,000 Units by mouth daily. Also takes 50,000 units weekly ?Start taking on: Apr 19, 2022 ?  ?lactulose 10 GM/15ML solution ?Commonly known as: [place] ?Take 10-20 g by mouth 3 (three) times daily as needed for constipation. ?  ?levothyroxine 50 MCG tablet ?Commonly known as: SYNTHROID ?Take 100 mcg by mouth daily. ?  ?naloxone 4 MG/0.1ML Liqd nasal spray kit ?Commonly known as: NARCAN ?Place 1 spray into the nose as needed (opioid reversal). ?  ?omeprazole 20 MG tablet ?Commonly known as: PRILOSEC OTC ?Take 20 mg by mouth daily. ?  ?  oxyCODONE 15 MG immediate release tablet ?Commonly known as: ROXICODONE ?Take 15-30 mg by mouth every 4 (four) hours as needed for pain. ?  ?polyethylene glycol 17 g packet ?Commonly known as: MIRALAX / GLYCOLAX ?Take 17 g by mouth daily. ?Start taking on: March 19, 2022 ?  ?pregabalin 50 MG capsule ?Commonly known as: LYRICA ?Take 50 mg by mouth 3 (three) times daily. ?  ?prochlorperazine 10 MG tablet ?Commonly known as: COMPAZINE ?Take 10 mg by mouth every 6 (six) hours as needed for nausea/vomiting. ?  ?rivaroxaban 20 MG Tabs tablet ?Commonly known as: XARELTO ?Take 1 tablet (20 mg total) by mouth daily with supper. ?What changed:  ?medication strength ?how much to take ?when to take this ?   ?senna-docusate 8.6-50 MG tablet ?Commonly known as: Senokot-S ?Take 2 tablets by mouth daily. ?  ?Spiriva Respimat 1.25 MCG/ACT Aers ?Generic drug: Tiotropium Bromide Monohydrate ?Inhale 2 puffs into the lungs d

## 2022-03-18 NOTE — Progress Notes (Signed)
ANTICOAGULATION CONSULT NOTE ? ?Pharmacy Consult for heparin > Xarelto ?Indication: arterial emobli in the LLE, ischemic leg ? ?No Known Allergies ? ?Patient Measurements: ?Weight: 56.9 kg (125 lb 7.1 oz) ?Heparin Dosing Weight: 62.6kg ? ?Vital Signs: ?Temp: 98.4 ?F (36.9 ?C) (04/10 0740) ?Temp Source: Oral (04/10 0740) ?BP: 108/73 (04/10 0740) ?Pulse Rate: 91 (04/10 0740) ? ?Labs: ?Recent Labs  ?  03/15/22 ?2355 03/16/22 ?3875 03/16/22 ?6433 03/16/22 ?1756 03/17/22 ?0411  ?HGB 11.5*  --   --   --  9.0*  ?HCT 33.1*  --   --   --  26.6*  ?PLT 187  --   --   --  136*  ?LABPROT 12.9  --   --   --   --   ?INR 1.0  --   --   --   --   ?HEPARINUNFRC  --   --  0.17* 0.22* 0.37  ?CREATININE 1.93* 1.81*  --   --  1.11  ? ? ? ?Estimated Creatinine Clearance: 55.5 mL/min (by C-G formula based on SCr of 1.11 mg/dL). ? ? ?Assessment: ?89 YOM with history of metastatic SCLC with mets to the brain, spine with spinal cord compression and bilateral LE paralysis, chronic opiate-induced constipation, and history of homelessness who presents via EMS from family members home with concern for abdominal pain.  Per chart review pt was recently admitted at [facility] with concern for complications malignancy. Concern at that time for ischemia in bilateral LE, started on anticoagulation with reperfusion clinically.  At that time recommended discharge to SNF but patient refused and left AMA.  States he has not had any anticoagulation since that time. Pharmacy to dose heparin drip for arterial emobli in the LLE, ischemic leg. ? ?4/10 - Pharmacy consulted to transition to Xarelto (no need to load per MD). Patient refusing lab draws this morning. Hg down to 9, plt down to 136, SCr down to 1.11 yesterday. No bleed issues reported. ? ?Goal of Therapy:  ?Heparin level 0.3-0.7 units/ml ?Monitor platelets by anticoagulation protocol: Yes ?  ?Plan:  ?D/c heparin drip at time of 1st dose of Xarelto 20mg  PO Qsupper - communicated plan with  RN ?Monitor CBC, s/sx bleeding ? ? ?Arturo Morton, PharmD, BCPS ?Please check AMION for all [facility] contact numbers ?Clinical Pharmacist ?03/18/2022 9:52 AM ? ?

## 2022-03-18 NOTE — Progress Notes (Signed)
Patient refused all blood lab draws. Patient educated on the consequences of refusing all the medical treatments.  ?

## 2022-03-18 NOTE — Evaluation (Signed)
Physical Therapy Evaluation ?Patient Details ?Name: Randall Valencia ?MRN: [id number] ?DOB: 10/06/1961 ?Today's Date: 03/18/2022 ? ?History of Present Illness ? 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  ?Clinical Impression ? Patient not agreeable to PT during hospital stay, but planned to see when his ride arrived to take him home for some education and to see how he transfers.  Patient and friend educated on use of transfer board for bed to w/c and w/c to car [facility]) and they were educated where to obtain for home use.  Patient concerned about sacral wound and using "donut" cushion in w/c, but feel roho might provide better pressure relief.  Declined acute level PT, so already d/c so services likely unable to be set up at this time.  No further acute skilled PT as d/c home.    ?   ? ?Recommendations for follow up therapy are one component of a multi-disciplinary discharge planning process, led by the attending physician.  Recommendations may be updated based on patient status, additional functional criteria and insurance authorization. ? ?Follow Up Recommendations Home health PT ? ?  ?Assistance Recommended at Discharge Intermittent Supervision/Assistance  ?Patient can return home with the following ? A lot of help with bathing/dressing/bathroom;A lot of help with walking and/or transfers;Assist for transportation;Assistance with cooking/housework;Help with stairs or ramp for entrance ? ?  ?Equipment Recommendations Other (comment);Wheelchair cushion (measurements PT) (slid board; roho cushion 16x16)  ?Recommendations for Other Services ?    ?  ?Functional Status Assessment Patient has had a recent decline in their functional status and demonstrates the ability  to make significant improvements in function in a reasonable and predictable amount of time.  ? ?  ?Precautions / Restrictions Precautions ?Precautions: Fall ?Precaution Comments: B LE paralysis, R knee and sacral wounds, incontinence, foley  ? ?  ? ?Mobility ? Bed Mobility ?Overal bed mobility: Needs Assistance ?Bed Mobility: Supine to Sit ?  ?  ?Supine to sit: Max assist ?  ?  ?General bed mobility comments: assist to manage LE's off bed and HHA to lift trunk ?  ? ?Transfers ?Overall transfer level: Needs assistance ?  ?Transfers: Bed to chair/wheelchair/BSC ?  ?  ?  ?  ?  ? Lateral/Scoot Transfers: With slide board, Max assist ?General transfer comment: scooting hips bed to w/c on pt's pressure relieving "donut"; then to car [employer]); with max A for placing legs into car, placing board and assisting to scoot up till pt able to reach overhead handle and assist ?  ? ?Ambulation/Gait ?  ?  ?  ?  ?  ?  ?  ?  ? ?Stairs ?  ?  ?  ?  ?  ? ?Wheelchair Mobility ?  ? ?Modified Rankin (Stroke Patients Only) ?  ? ?  ? ?Balance Overall balance assessment: Needs assistance ?Sitting-balance support: Bilateral upper extremity supported ?Sitting balance-Leahy Scale: Poor ?Sitting balance - Comments: UE support and at least minguard for balance EOB ?  ?  ?  ?  ?  ?  ?  ?  ?  ?  ?  ?  ?  ?  ?  ?   ? ? ? ?Pertinent Vitals/Pain Pain Assessment ?Pain Assessment: Faces ?Faces Pain Scale: Hurts whole lot ?Pain Location: sacral wound with sitting ?Pain  Descriptors / Indicators: Sore ?Pain Intervention(s): Monitored during session, Repositioned  ? ? ?Home Living Family/patient expects to be discharged to:: Private residence ?Living Arrangements: Non-relatives/Friends ?Available Help at Discharge: Available 24 hours/day ?Type of Home: House ?Home Access: Stairs to enter ?  ?  ?  ?Home Layout: One level ?Home Equipment: Wheelchair - manual;Tub bench ?Additional Comments: information obtained from [place], pt's brother: he was unsure of  home set up or PLOF. Jeneen Rinks said there was a plan for a ramp to be installed and new floors, unsure if those things have happened. Pt apparently lives with friends who are there 24/7. Pt has a son who lives with pt's ex-wife; unsure of availablility to assist.  ?  ?Prior Function Prior Level of Function : Needs assist ?  ?  ?  ?  ?  ?  ?Mobility Comments: Pt's brother Jeneen Rinks reported that he was mobilizing at a wc level, unsure about transfers ?ADLs Comments: Pt's brother Jeneen Rinks was unsure how pt was completing ADLs. ?  ? ? ?Hand Dominance  ?   ? ?  ?Extremity/Trunk Assessment  ? Upper Extremity Assessment ?Upper Extremity Assessment: Overall WFL for tasks assessed ?  ? ?Lower Extremity Assessment ?Lower Extremity Assessment: RLE deficits/detail;LLE deficits/detail ?RLE Deficits / Details: B LE paralysis; weeping wound on R knee covered with Mepilex ?LLE Deficits / Details: B LE paralysis ?  ? ?Cervical / Trunk Assessment ?Cervical / Trunk Assessment: Other exceptions ?Cervical / Trunk Exceptions: UE's for trunk control sitting due to T12 lytic lesion and SCI  ?Communication  ?    ?Cognition Arousal/Alertness: Awake/alert ?Behavior During Therapy: Anxious ?Overall Cognitive Status: Impaired/Different from baseline ?Area of Impairment: Safety/judgement, Attention ?  ?  ?  ?  ?  ?  ?  ?  ?  ?Current Attention Level: Selective ?  ?  ?Safety/Judgement: Decreased awareness of safety, Decreased awareness of deficits ?  ?  ?General Comments: hyperverbal; initially refused PT for any mobility despite encouragement and letting him know we can recommend needed equipment; set up to see when ride arrived to take him home and stating he plans to go to inpatient rehab, etc. fearful of falling, directing his care farily well ?  ?  ? ?  ?General Comments General comments (skin integrity, edema, etc.): Educated pt/friend to obtain transfer board from medical supply company or online ("[employer]") ? ?  ?Exercises    ? ?Assessment/Plan  ?   ?PT Assessment All further PT needs can be met in the next venue of care  ?PT Problem List   ? ?   ?  ?PT Treatment Interventions     ? ?PT Goals (Current goals can be found in the Care Plan section)  ?Acute Rehab PT Goals ?PT Goal Formulation: All assessment and education complete, DC therapy ? ?  ?Frequency   ?  ? ? ?Co-evaluation   ?  ?  ?  ?  ? ? ?  ?AM-PAC PT "6 Clicks" Mobility  ?Outcome Measure Help needed turning from your back to your side while in a flat bed without using bedrails?: A Lot ?Help needed moving from lying on your back to sitting on the side of a flat bed without using bedrails?: Total ?Help needed moving to and from a bed to a chair (including a wheelchair)?: Total ?Help needed standing up from a chair using your arms (e.g., wheelchair or bedside chair)?: Total ?Help needed to walk in hospital room?: Total ?Help needed climbing 3-5 steps with a railing? :  Total ?6 Click Score: 7 ? ?  ?End of Session Equipment Utilized During Treatment: Gait belt ?Activity Tolerance: Patient tolerated treatment well ?Patient left: Other (comment) (discharged in car with friend) ?  ?PT Visit Diagnosis: Muscle weakness (generalized) (M62.81);Difficulty in walking, not elsewhere classified (R26.2) ?  ? ?Time: 1115-1140 ?PT Time Calculation (min) (ACUTE ONLY): 25 min ? ? ?Charges:   PT Evaluation ?$PT Eval Moderate Complexity: 1 Mod ?PT Treatments ?$Therapeutic Activity: 8-22 mins ?  ?   ? ? ?Magda Kiel, PT ?Acute Rehabilitation Services ?LMRAJ:[phone] ?Office:[phone] ?03/18/2022 ? ? ?Reginia Naas ?03/18/2022, 12:13 PM ? ?

## 2022-03-18 NOTE — Progress Notes (Signed)
OT Cancellation Note ? ?Patient Details ?Name: Randall Valencia ?MRN: [id number] ?DOB: [birth year] ? ? ?Cancelled Treatment:    Reason Eval/Treat Not Completed: Patient declined, no reason specified (PLOF and home set up obtained via phone call to brother, Randall Valencia. Pt continues to refuse participation despite ecouragement and education on therapy recommendations. OT evaluation to plan to re-attempt as pt allows.) ? ?Pt's brother reports Randall Valencia lives in a 1 level home with friends who are there 24/7 to assist. There were plans for a ramp to be installed and new floors, unsure if those updates happened. Randall Valencia reports pt has a wc and a tub bench; pt reports he has a "brand new" wc with a cushion.  ? ?Randall Valencia A Randall Valencia ?03/18/2022, 9:55 AM ?

## 2022-03-18 NOTE — Consult Note (Addendum)
WOC consult requested for sacrum. Pt refused to let me assess the location.  He stated; "you aint looking at it and I am leaving today. I hate this place and all the people are rude." ?Unable to provide further recommendations for topical care. ?Please re-consult if further assistance is needed.  Thank-you,  ?Julien Girt MSN, RN, [place], [place], CNS ?[phone]  ?

## 2022-03-18 NOTE — Progress Notes (Signed)
Unable to completely assess sacral wound due to pt noncompliance.  Pt would only partially roll for me to assess.   ?

## 2022-03-18 NOTE — Progress Notes (Signed)
Pt refused all morning meds.  Secure chat sent to Dr. Tawanna Solo to advise. Attempted to provide pt education as to importance of taking meds and participating in treatment. ?

## 2022-03-21 LAB — CULTURE, BLOOD (ROUTINE X 2)
Culture: NO GROWTH
Culture: NO GROWTH
Special Requests: ADEQUATE

## 2022-03-25 ENCOUNTER — Inpatient Hospital Stay (HOSPITAL_COMMUNITY)
Admission: EM | Admit: 2022-03-25 | Discharge: 2022-04-08 | DRG: 871 | Disposition: E | Payer: Medicaid Other | Attending: Internal Medicine | Admitting: Internal Medicine

## 2022-03-25 ENCOUNTER — Other Ambulatory Visit: Payer: Self-pay

## 2022-03-25 ENCOUNTER — Emergency Department (HOSPITAL_COMMUNITY): Payer: Medicaid Other

## 2022-03-25 ENCOUNTER — Other Ambulatory Visit (HOSPITAL_COMMUNITY): Payer: Self-pay

## 2022-03-25 ENCOUNTER — Encounter (HOSPITAL_COMMUNITY): Payer: Self-pay

## 2022-03-25 ENCOUNTER — Telehealth (HOSPITAL_BASED_OUTPATIENT_CLINIC_OR_DEPARTMENT_OTHER): Payer: Self-pay

## 2022-03-25 DIAGNOSIS — J449 Chronic obstructive pulmonary disease, unspecified: Secondary | ICD-10-CM | POA: Diagnosis present

## 2022-03-25 DIAGNOSIS — D696 Thrombocytopenia, unspecified: Secondary | ICD-10-CM | POA: Diagnosis not present

## 2022-03-25 DIAGNOSIS — A4151 Sepsis due to Escherichia coli [E. coli]: Principal | ICD-10-CM | POA: Diagnosis present

## 2022-03-25 DIAGNOSIS — Z91148 Patient's other noncompliance with medication regimen for other reason: Secondary | ICD-10-CM

## 2022-03-25 DIAGNOSIS — C7931 Secondary malignant neoplasm of brain: Secondary | ICD-10-CM | POA: Diagnosis present

## 2022-03-25 DIAGNOSIS — Z8589 Personal history of malignant neoplasm of other organs and systems: Secondary | ICD-10-CM | POA: Diagnosis not present

## 2022-03-25 DIAGNOSIS — T83511A Infection and inflammatory reaction due to indwelling urethral catheter, initial encounter: Secondary | ICD-10-CM | POA: Diagnosis present

## 2022-03-25 DIAGNOSIS — E43 Unspecified severe protein-calorie malnutrition: Secondary | ICD-10-CM | POA: Diagnosis present

## 2022-03-25 DIAGNOSIS — R532 Functional quadriplegia: Secondary | ICD-10-CM | POA: Diagnosis present

## 2022-03-25 DIAGNOSIS — T451X5A Adverse effect of antineoplastic and immunosuppressive drugs, initial encounter: Secondary | ICD-10-CM | POA: Diagnosis not present

## 2022-03-25 DIAGNOSIS — Y846 Urinary catheterization as the cause of abnormal reaction of the patient, or of later complication, without mention of misadventure at the time of the procedure: Secondary | ICD-10-CM | POA: Diagnosis present

## 2022-03-25 DIAGNOSIS — L02416 Cutaneous abscess of left lower limb: Secondary | ICD-10-CM | POA: Diagnosis present

## 2022-03-25 DIAGNOSIS — G9520 Unspecified cord compression: Secondary | ICD-10-CM | POA: Diagnosis present

## 2022-03-25 DIAGNOSIS — L8990 Pressure ulcer of unspecified site, unspecified stage: Secondary | ICD-10-CM | POA: Diagnosis present

## 2022-03-25 DIAGNOSIS — R748 Abnormal levels of other serum enzymes: Secondary | ICD-10-CM | POA: Diagnosis present

## 2022-03-25 DIAGNOSIS — Z515 Encounter for palliative care: Secondary | ICD-10-CM

## 2022-03-25 DIAGNOSIS — Z66 Do not resuscitate: Secondary | ICD-10-CM | POA: Diagnosis present

## 2022-03-25 DIAGNOSIS — R739 Hyperglycemia, unspecified: Secondary | ICD-10-CM | POA: Diagnosis not present

## 2022-03-25 DIAGNOSIS — R6520 Severe sepsis without septic shock: Secondary | ICD-10-CM | POA: Diagnosis not present

## 2022-03-25 DIAGNOSIS — J9621 Acute and chronic respiratory failure with hypoxia: Secondary | ICD-10-CM | POA: Diagnosis present

## 2022-03-25 DIAGNOSIS — C3490 Malignant neoplasm of unspecified part of unspecified bronchus or lung: Secondary | ICD-10-CM | POA: Diagnosis not present

## 2022-03-25 DIAGNOSIS — G9529 Other cord compression: Secondary | ICD-10-CM | POA: Diagnosis present

## 2022-03-25 DIAGNOSIS — L03119 Cellulitis of unspecified part of limb: Secondary | ICD-10-CM | POA: Diagnosis not present

## 2022-03-25 DIAGNOSIS — Z79890 Hormone replacement therapy (postmenopausal): Secondary | ICD-10-CM

## 2022-03-25 DIAGNOSIS — L03116 Cellulitis of left lower limb: Secondary | ICD-10-CM | POA: Diagnosis present

## 2022-03-25 DIAGNOSIS — M6282 Rhabdomyolysis: Secondary | ICD-10-CM | POA: Diagnosis present

## 2022-03-25 DIAGNOSIS — R64 Cachexia: Secondary | ICD-10-CM | POA: Diagnosis present

## 2022-03-25 DIAGNOSIS — I619 Nontraumatic intracerebral hemorrhage, unspecified: Secondary | ICD-10-CM | POA: Diagnosis present

## 2022-03-25 DIAGNOSIS — Z87891 Personal history of nicotine dependence: Secondary | ICD-10-CM

## 2022-03-25 DIAGNOSIS — Z7901 Long term (current) use of anticoagulants: Secondary | ICD-10-CM

## 2022-03-25 DIAGNOSIS — N179 Acute kidney failure, unspecified: Secondary | ICD-10-CM | POA: Diagnosis present

## 2022-03-25 DIAGNOSIS — G9341 Metabolic encephalopathy: Secondary | ICD-10-CM | POA: Diagnosis present

## 2022-03-25 DIAGNOSIS — Z7189 Other specified counseling: Secondary | ICD-10-CM | POA: Diagnosis not present

## 2022-03-25 DIAGNOSIS — C3432 Malignant neoplasm of lower lobe, left bronchus or lung: Secondary | ICD-10-CM | POA: Diagnosis present

## 2022-03-25 DIAGNOSIS — Z79899 Other long term (current) drug therapy: Secondary | ICD-10-CM

## 2022-03-25 DIAGNOSIS — L89150 Pressure ulcer of sacral region, unstageable: Secondary | ICD-10-CM | POA: Diagnosis present

## 2022-03-25 DIAGNOSIS — R4182 Altered mental status, unspecified: Principal | ICD-10-CM

## 2022-03-25 DIAGNOSIS — E039 Hypothyroidism, unspecified: Secondary | ICD-10-CM | POA: Diagnosis present

## 2022-03-25 DIAGNOSIS — L0291 Cutaneous abscess, unspecified: Secondary | ICD-10-CM

## 2022-03-25 DIAGNOSIS — A408 Other streptococcal sepsis: Secondary | ICD-10-CM | POA: Diagnosis present

## 2022-03-25 DIAGNOSIS — Z20822 Contact with and (suspected) exposure to covid-19: Secondary | ICD-10-CM | POA: Diagnosis present

## 2022-03-25 DIAGNOSIS — L89891 Pressure ulcer of other site, stage 1: Secondary | ICD-10-CM | POA: Diagnosis present

## 2022-03-25 DIAGNOSIS — D6959 Other secondary thrombocytopenia: Secondary | ICD-10-CM | POA: Diagnosis present

## 2022-03-25 DIAGNOSIS — Z820 Family history of epilepsy and other diseases of the nervous system: Secondary | ICD-10-CM

## 2022-03-25 DIAGNOSIS — Z7401 Bed confinement status: Secondary | ICD-10-CM

## 2022-03-25 DIAGNOSIS — E860 Dehydration: Secondary | ICD-10-CM | POA: Diagnosis not present

## 2022-03-25 DIAGNOSIS — Z7982 Long term (current) use of aspirin: Secondary | ICD-10-CM

## 2022-03-25 DIAGNOSIS — L02419 Cutaneous abscess of limb, unspecified: Secondary | ICD-10-CM | POA: Diagnosis present

## 2022-03-25 DIAGNOSIS — C7951 Secondary malignant neoplasm of bone: Secondary | ICD-10-CM | POA: Diagnosis present

## 2022-03-25 DIAGNOSIS — Z818 Family history of other mental and behavioral disorders: Secondary | ICD-10-CM

## 2022-03-25 DIAGNOSIS — A419 Sepsis, unspecified organism: Secondary | ICD-10-CM | POA: Diagnosis not present

## 2022-03-25 DIAGNOSIS — I959 Hypotension, unspecified: Secondary | ICD-10-CM

## 2022-03-25 DIAGNOSIS — Z681 Body mass index (BMI) 19 or less, adult: Secondary | ICD-10-CM

## 2022-03-25 DIAGNOSIS — N390 Urinary tract infection, site not specified: Secondary | ICD-10-CM | POA: Diagnosis present

## 2022-03-25 DIAGNOSIS — D6481 Anemia due to antineoplastic chemotherapy: Secondary | ICD-10-CM | POA: Diagnosis present

## 2022-03-25 DIAGNOSIS — R339 Retention of urine, unspecified: Secondary | ICD-10-CM | POA: Diagnosis present

## 2022-03-25 DIAGNOSIS — K219 Gastro-esophageal reflux disease without esophagitis: Secondary | ICD-10-CM | POA: Diagnosis present

## 2022-03-25 DIAGNOSIS — E162 Hypoglycemia, unspecified: Secondary | ICD-10-CM | POA: Diagnosis not present

## 2022-03-25 DIAGNOSIS — F444 Conversion disorder with motor symptom or deficit: Secondary | ICD-10-CM | POA: Diagnosis present

## 2022-03-25 DIAGNOSIS — N400 Enlarged prostate without lower urinary tract symptoms: Secondary | ICD-10-CM | POA: Diagnosis present

## 2022-03-25 DIAGNOSIS — R531 Weakness: Secondary | ICD-10-CM | POA: Diagnosis present

## 2022-03-25 LAB — CBC WITH DIFFERENTIAL/PLATELET
Abs Immature Granulocytes: 0.26 10*3/uL — ABNORMAL HIGH (ref 0.00–0.07)
Basophils Absolute: 0 10*3/uL (ref 0.0–0.1)
Basophils Relative: 0 %
Eosinophils Absolute: 0 10*3/uL (ref 0.0–0.5)
Eosinophils Relative: 0 %
HCT: 31.9 % — ABNORMAL LOW (ref 39.0–52.0)
Hemoglobin: 10.8 g/dL — ABNORMAL LOW (ref 13.0–17.0)
Immature Granulocytes: 3 %
Lymphocytes Relative: 4 %
Lymphs Abs: 0.4 10*3/uL — ABNORMAL LOW (ref 0.7–4.0)
MCH: 32.1 pg (ref 26.0–34.0)
MCHC: 33.9 g/dL (ref 30.0–36.0)
MCV: 94.9 fL (ref 80.0–100.0)
Monocytes Absolute: 0.1 10*3/uL (ref 0.1–1.0)
Monocytes Relative: 1 %
Neutro Abs: 8.6 10*3/uL — ABNORMAL HIGH (ref 1.7–7.7)
Neutrophils Relative %: 92 %
Platelets: 78 10*3/uL — ABNORMAL LOW (ref 150–400)
RBC: 3.36 MIL/uL — ABNORMAL LOW (ref 4.22–5.81)
RDW: 15.8 % — ABNORMAL HIGH (ref 11.5–15.5)
WBC: 9.4 10*3/uL (ref 4.0–10.5)
nRBC: 0 % (ref 0.0–0.2)

## 2022-03-25 LAB — URINALYSIS, ROUTINE W REFLEX MICROSCOPIC
Bilirubin Urine: NEGATIVE
Glucose, UA: NEGATIVE mg/dL
Ketones, ur: NEGATIVE mg/dL
Nitrite: NEGATIVE
Protein, ur: 100 mg/dL — AB
Specific Gravity, Urine: 1.016 (ref 1.005–1.030)
WBC, UA: >50 WBC/hpf — ABNORMAL HIGH (ref 0–5)
pH: 5 (ref 5.0–8.0)

## 2022-03-25 LAB — COMPREHENSIVE METABOLIC PANEL
ALT: 18 U/L (ref 0–44)
AST: 50 U/L — ABNORMAL HIGH (ref 15–41)
Albumin: 2.1 g/dL — ABNORMAL LOW (ref 3.5–5.0)
Alkaline Phosphatase: 75 U/L (ref 38–126)
Anion gap: 10 (ref 5–15)
BUN: 53 mg/dL — ABNORMAL HIGH (ref 8–23)
CO2: 23 mmol/L (ref 22–32)
Calcium: 7.8 mg/dL — ABNORMAL LOW (ref 8.9–10.3)
Chloride: 99 mmol/L (ref 98–111)
Creatinine, Ser: 1.78 mg/dL — ABNORMAL HIGH (ref 0.61–1.24)
GFR, Estimated: 43 mL/min — ABNORMAL LOW (ref >60)
Glucose, Bld: 95 mg/dL (ref 70–99)
Potassium: 4.3 mmol/L (ref 3.5–5.1)
Sodium: 132 mmol/L — ABNORMAL LOW (ref 135–145)
Total Bilirubin: 0.6 mg/dL (ref 0.3–1.2)
Total Protein: 5.6 g/dL — ABNORMAL LOW (ref 6.5–8.1)

## 2022-03-25 LAB — BLOOD GAS, ARTERIAL
Acid-base deficit: 0.3 mmol/L (ref 0.0–2.0)
Bicarbonate: 22.8 mmol/L (ref 20.0–28.0)
Drawn by: 25770
O2 Content: 3.5 L/min
O2 Saturation: 96.4 %
Patient temperature: 36.3
pCO2 arterial: 31 mmHg — ABNORMAL LOW (ref 32–48)
pH, Arterial: 7.47 — ABNORMAL HIGH (ref 7.35–7.45)
pO2, Arterial: 66 mmHg — ABNORMAL LOW (ref 83–108)

## 2022-03-25 LAB — PREALBUMIN: Prealbumin: 6.1 mg/dL — ABNORMAL LOW (ref 18–38)

## 2022-03-25 LAB — CK: Total CK: 681 U/L — ABNORMAL HIGH (ref 49–397)

## 2022-03-25 LAB — PHOSPHORUS: Phosphorus: 3.7 mg/dL (ref 2.5–4.6)

## 2022-03-25 LAB — RESP PANEL BY RT-PCR (FLU A&B, COVID) ARPGX2
Influenza A by PCR: NEGATIVE
Influenza B by PCR: NEGATIVE
SARS Coronavirus 2 by RT PCR: NEGATIVE

## 2022-03-25 LAB — MAGNESIUM: Magnesium: 2.3 mg/dL (ref 1.7–2.4)

## 2022-03-25 LAB — PROTIME-INR
INR: 1.1 (ref 0.8–1.2)
Prothrombin Time: 13.7 seconds (ref 11.4–15.2)

## 2022-03-25 LAB — LACTIC ACID, PLASMA
Lactic Acid, Venous: 2.9 mmol/L (ref 0.5–1.9)
Lactic Acid, Venous: 3.2 mmol/L (ref 0.5–1.9)
Lactic Acid, Venous: 2.8 mmol/L (ref 0.5–1.9)

## 2022-03-25 MED ORDER — METRONIDAZOLE 500 MG/100ML IV SOLN
500.0000 mg | Freq: Two times a day (BID) | INTRAVENOUS | Status: DC
  Administered 2022-03-25 – 2022-03-26 (×2): 500 mg via INTRAVENOUS
  Filled 2022-03-25 (×2): qty 100

## 2022-03-25 MED ORDER — SODIUM CHLORIDE 0.9 % IV SOLN
250.0000 mg | Freq: Two times a day (BID) | INTRAVENOUS | Status: DC
  Administered 2022-03-25 – 2022-03-27 (×4): 250 mg via INTRAVENOUS
  Filled 2022-03-25 (×14): qty 2.5

## 2022-03-25 MED ORDER — LACTATED RINGERS IV BOLUS
1000.0000 mL | Freq: Once | INTRAVENOUS | Status: AC
  Administered 2022-03-25: 1000 mL via INTRAVENOUS

## 2022-03-25 MED ORDER — SODIUM CHLORIDE 0.9 % IV SOLN
2.0000 g | Freq: Two times a day (BID) | INTRAVENOUS | Status: DC
  Administered 2022-03-26: 2 g via INTRAVENOUS
  Filled 2022-03-25: qty 12.5

## 2022-03-25 MED ORDER — FENTANYL CITRATE PF 50 MCG/ML IJ SOSY
12.5000 ug | PREFILLED_SYRINGE | INTRAMUSCULAR | Status: DC | PRN
  Administered 2022-03-25 – 2022-03-26 (×2): 25 ug via INTRAVENOUS
  Administered 2022-03-26 – 2022-03-27 (×6): 50 ug via INTRAVENOUS
  Filled 2022-03-25 (×8): qty 1

## 2022-03-25 MED ORDER — SODIUM CHLORIDE 0.9 % IV SOLN
2.0000 g | Freq: Once | INTRAVENOUS | Status: AC
  Administered 2022-03-25: 2 g via INTRAVENOUS
  Filled 2022-03-25: qty 12.5

## 2022-03-25 MED ORDER — VANCOMYCIN HCL IN DEXTROSE 1-5 GM/200ML-% IV SOLN
1000.0000 mg | Freq: Once | INTRAVENOUS | Status: AC
  Administered 2022-03-25: 1000 mg via INTRAVENOUS
  Filled 2022-03-25: qty 200

## 2022-03-25 MED ORDER — VANCOMYCIN HCL 750 MG/150ML IV SOLN: 750.0000 mg | INTRAVENOUS | Status: DC

## 2022-03-25 MED ORDER — HYDROCODONE-ACETAMINOPHEN 5-325 MG PO TABS
1.0000 | ORAL_TABLET | ORAL | Status: DC | PRN
  Filled 2022-03-25: qty 2

## 2022-03-25 MED ORDER — LEVETIRACETAM IN NACL 500 MG/100ML IV SOLN: 500.0000 mg | INTRAVENOUS | Status: DC

## 2022-03-25 NOTE — ED Provider Notes (Signed)
?[facility] DEPT ?Provider Note ? ? ?CSN: [id number] ?Arrival date & time: 03/19/2022  1551 ? ?  ? ?History ? ?Chief Complaint  ?Patient presents with  ? Code Sepsis  ? ? ?Randall Valencia is a 61 y.o. male. ? ?61 year old male with prior medical history as detailed below presents for evaluation.  He arrives by EMS from the home that he was staying in.  Apparently his daughter checked on him today and found to be significantly disoriented.  He was covered in feces. ? ?EMS reports that the patient was mildly hypoxic with room air saturations in the upper 80s.  Patient reports his initial blood pressure was in the 78M systolic. ? ?Patient is unable to provide significant details.  He appears moderately disoriented.  He answers questions with "yes." He groans with painful stimulation. ? ?Prior medical history is significant for metastatic squamous cell lung cancer, with metastasis to brain and vertebra resulting in compression with bilateral lower extremity paralysis, urinary retention, chronic hypoxic respiratory failure on 2 L of oxygen.  Patient with recent admission to this facility on April 7 through April 10.  Patient apparently refused placement.  Patient apparently would not talk with palliative care when they were consulted.  During recent admission there was a concern for possible ischemia to his lower extremities. CT angiogram aorta - showed bifemoral showed filling defects within the distal superficial femoral artery on the left as well as at the level of the popliteal trifurcation consistent with emboli,distal runoff on the left was not visualized due to these changes.  Vascular surgery was consulted. Patient was placed on heparin gtt.  Vascular surgery is not planning for any intervention, he is not a candidate for revascularization.  Vascular surgery recommended to follow-up with his own vascular surgeon at [facility]. ? ?The history is provided by the patient, a relative and medical  records.  ?Illness ?Location:  AMS, hypotension, weakness, disorientation, suspected sepsis ?Severity:  Severe ?Onset quality:  Unable to specify ?Timing:  Unable to specify ?Progression:  Unable to specify ?Chronicity:  New ? ?  ? ?Home Medications ?Prior to Admission medications   ?Medication Sig Start Date End Date Taking? Authorizing Provider  ?albuterol (VENTOLIN HFA) 108 (90 Base) MCG/ACT inhaler Inhale 2 puffs into the lungs every 6 (six) hours as needed for wheezing or shortness of breath. 03/18/22   Shelly Coss, MD  ?aspirin 81 MG chewable tablet Chew 81 mg by mouth daily. 03/09/22 03/09/23  [provider]  ?bisacodyl (DULCOLAX) 10 MG suppository Place 10 mg rectally daily. 03/08/22   [provider]  ?Cholecalciferol 1.25 MG (50000 UT) capsule Take 50,000 Units by mouth once a week. Also takes 2,000 units daily 03/15/22 03/15/23  [provider]  ?Cholecalciferol 50 MCG (2000 UT) TABS Take 2,000 Units by mouth daily. Also takes 50,000 units weekly 04/19/22   [provider]  ?lactulose (CHRONULAC) 10 GM/15ML solution Take 10-20 g by mouth 3 (three) times daily as needed for constipation. 12/21/21   [provider]  ?levothyroxine (SYNTHROID) 50 MCG tablet Take 100 mcg by mouth daily. 02/13/22   [provider]  ?naloxone (NARCAN) nasal spray 4 mg/0.1 mL Place 1 spray into the nose as needed (opioid reversal). 11/29/21   [provider]  ?omeprazole (PRILOSEC OTC) 20 MG tablet Take 20 mg by mouth daily. 10/04/21 10/04/22  [provider]  ?oxyCODONE (ROXICODONE) 15 MG immediate release tablet Take 15-30 mg by mouth every 4 (four) hours as needed for  pain. 01/25/22   [provider]  ?polyethylene glycol powder (GLYCOLAX/MIRALAX) 17 GM/SCOOP powder Take 17 g by mouth daily. 03/19/22   Shelly Coss, MD  ?pregabalin (LYRICA) 50 MG capsule Take 50 mg by mouth 3 (three) times daily. 01/25/22   [provider]  ?prochlorperazine  (COMPAZINE) 10 MG tablet Take 10 mg by mouth every 6 (six) hours as needed for nausea/vomiting. 08/02/21   [provider]  ?rivaroxaban (XARELTO) 20 MG TABS tablet Take 1 tablet (20 mg total) by mouth daily with supper. 03/18/22   Shelly Coss, MD  ?senna-docusate (SENOKOT-S) 8.6-50 MG tablet Take 2 tablets by mouth daily. 03/18/22   Shelly Coss, MD  ?SPIRIVA RESPIMAT 1.25 MCG/ACT AERS Inhale 2 puffs into the lungs daily. 03/18/22   Shelly Coss, MD  ?Dellis Anes 160-4.5 MCG/ACT inhaler Inhale 2 puffs into the lungs 2 (two) times daily. 03/18/22   Shelly Coss, MD  ?tamsulosin (FLOMAX) 0.4 MG CAPS capsule Take 1 capsule (0.4 mg total) by mouth daily. 03/18/22   Shelly Coss, MD  ?thiamine 100 MG tablet Take 1 tablet (100 mg total) by mouth daily. 03/18/22 03/18/23  Shelly Coss, MD  ?   ? ?Allergies    ?Patient has no known allergies.   ? ?Review of Systems   ?Review of Systems ? ?Physical Exam ?Updated Vital Signs ?BP 117/71   Pulse (!) 110   Temp 97.7 ?F (36.5 ?C) (Oral)   Resp (!) 26   SpO2 (!) 52%  ?Physical Exam ?Vitals and nursing note reviewed.  ?Constitutional:   ?   General: He is not in acute distress. ?   Appearance: He is well-developed.  ?   Comments: Alert, disoriented, answers verbal questions with "yes" ? ?Cachectic, chronically ill in appearance ? ?Covered in feces  ?HENT:  ?   Head: Normocephalic and atraumatic.  ?Eyes:  ?   Conjunctiva/sclera: Conjunctivae normal.  ?   Pupils: Pupils are equal, round, and reactive to light.  ?Cardiovascular:  ?   Rate and Rhythm: Regular rhythm. Tachycardia present.  ?   Heart sounds: Normal heart sounds.  ?Pulmonary:  ?   Effort: Pulmonary effort is normal. No respiratory distress.  ?   Breath sounds: Normal breath sounds.  ?Abdominal:  ?   General: There is no distension.  ?   Palpations: Abdomen is soft.  ?   Tenderness: There is no abdominal tenderness.  ?Genitourinary: ?   Comments: Indwelling Foley present. ?Musculoskeletal:     ?    General: No deformity. Normal range of motion.  ?   Cervical back: Normal range of motion and neck supple.  ?Skin: ?   General: Skin is warm and dry.  ?   Comments: Large draining abscess overlying the right knee and the right lateral thigh.  With milking of the thigh approximately 258ml purulence expressed from an open wound over the anterior aspect of the right knee ? ?Large decubitus ulceration noted around rectum and buttocks. ? ?Distal bilateral upper extremities and bilateral lower extremities are cool to the touch.   ?Neurological:  ?   Mental Status: He is alert.  ?   Comments: Alert, disoriented. ? ?Response to verbal stimulation ? ?Answers questions with "yes" ? ?Disoriented to time, place, situation. ? ?  ? ? ?ED Results / Procedures / Treatments   ?Labs ?(all labs ordered are listed, but only abnormal results are displayed) ?Labs Reviewed  ?COMPREHENSIVE METABOLIC PANEL - Abnormal; Notable for the following components:  ?  Result Value  ? Sodium 132 (*)   ? BUN 53 (*)   ? Creatinine, Ser 1.78 (*)   ? Calcium 7.8 (*)   ? Total Protein 5.6 (*)   ? Albumin 2.1 (*)   ? AST 50 (*)   ? GFR, Estimated 43 (*)   ? All other components within normal limits  ?LACTIC ACID, PLASMA - Abnormal; Notable for the following components:  ? Lactic Acid, Venous 2.9 (*)   ? All other components within normal limits  ?CBC WITH DIFFERENTIAL/PLATELET - Abnormal; Notable for the following components:  ? RBC 3.36 (*)   ? Hemoglobin 10.8 (*)   ? HCT 31.9 (*)   ? RDW 15.8 (*)   ? Platelets 78 (*)   ? All other components within normal limits  ?CK - Abnormal; Notable for the following components:  ? Total CK 681 (*)   ? All other components within normal limits  ?BLOOD GAS, ARTERIAL - Abnormal; Notable for the following components:  ? pH, Arterial 7.47 (*)   ? pCO2 arterial 31 (*)   ? pO2, Arterial 66 (*)   ? All other components within normal limits  ?CULTURE, BLOOD (ROUTINE X 2)  ?CULTURE, BLOOD (ROUTINE X 2)  ?RESP PANEL BY  RT-PCR (FLU A&B, COVID) [id number]  ?PROTIME-INR  ?LACTIC ACID, PLASMA  ?URINALYSIS, ROUTINE W REFLEX MICROSCOPIC  ? ? ?EKG ?EKG Interpretation ? ?Date/Time:  Monday March 25 2022 16:42:30 EDT ?Ventricular Rate:  107 ?PR I

## 2022-03-25 NOTE — Assessment & Plan Note (Signed)
-  SIRS criteria met with     ?   ?Component Value Date/Time  ? WBC 9.4 03/09/2022 1605  ? LYMPHSABS PENDING 04/05/2022 1605  ?  tachycardia   ,   RR >20 ?Today's Vitals  ? 04/03/2022 1745 04/06/2022 1800 03/13/2022 1815 03/18/2022 1830  ?BP: 117/71 110/70 109/72 109/86  ?Pulse: (!) 110  (!) 109 (!) 109  ?Resp: (!) 26 (!) 30 (!) 29 (!) 28  ?Temp:      ?TempSrc:      ?SpO2:    90%  ? ?There is no height or weight on file to calculate BMI. ? The recent clinical data is shown below. ?Vitals:  ? 03/16/2022 1745 04/03/2022 1800 03/09/2022 1815 04/05/2022 1830  ?BP: 117/71 110/70 109/72 109/86  ?Pulse: (!) 110  (!) 109 (!) 109  ?Resp: (!) 26 (!) 30 (!) 29 (!) 28  ?Temp:      ?TempSrc:      ?SpO2:    90%  ? ? -Most likely source being soft tissue infection,   ?  ? Patient meeting criteria for Severe sepsis with  ?  evidence of end organ damage/organ dysfunction such as  ?  elevated lactic acid >2  ?   ?Component Value Date/Time  ? LATICACIDVEN 2.9 ([place]) 03/21/2022 1605  ? ? acute metabolic encephalopathy ? SBP<90 mmhg or MAP < 65 mmhg,  ? Acute hypoxia requiring new supplemental oxygen, SpO2: 90 % ?O2 Flow Rate (L/min): 4 L/min ?  ? Acute thrombocytopenia with platelet < 100,000   ?   ?Component Value Date/Time  ? PLT 78 (L) 03/16/2022 1605  ? ? ?  ? ? - Obtain serial lactic acid and procalcitonin level. ? - Initiated IV antibiotics in ER: ?Antibiotics Given (last 72 hours)   ? Date/Time Action Medication Dose Rate  ? 03/11/2022 1635 New Bag/Given  ? metroNIDAZOLE (FLAGYL) IVPB 500 mg 500 mg 100 mL/hr  ? 04/01/2022 1704 New Bag/Given  ? ceFEPIme (MAXIPIME) 2 g in sodium chloride 0.9 % 100 mL IVPB 2 g 200 mL/hr  ? 04/02/2022 1829 New Bag/Given  ? vancomycin (VANCOCIN) IVPB 1000 mg/200 mL premix 1,000 mg 200 mL/hr  ?  ? ? ?Will continue   ? ? - await results of blood and urine culture ? - Rehydrate aggressively  ?Intravenous fluids were administered  ?  ? ?  ?6:52 PM ? ?

## 2022-03-25 NOTE — Progress Notes (Signed)
Pharmacy Antibiotic Note ? ?Randall Valencia is a 61 y.o. male admitted on 03/18/2022 with suspected sepsis. PMH metastatic squamous cell lung cancer with mets to spine/brain causing paralysis, urinary retention, and chronic hypoxia.  Pharmacy has been consulted for vancomycin and cefepime dosing. ? ?Plan: ?Vancomycin 1000 mg IV now, then 750 mg IV q24 hr (est AUC 550 based on SCr 1.78; Vd 0.72) ?Measure vancomycin AUC at steady state as indicated ?SCr daily x 3 while on vanc ?Cefepime 2 g IV q12 hr ?Flagyl per MD; dosing appropriate ?  ? ?Temp (24hrs), Avg:97.7 ?F (36.5 ?C), Min:97.7 ?F (36.5 ?C), Max:97.7 ?F (36.5 ?C) ? ?Recent Labs  ?Lab 03/26/2022 ?2409 03/28/2022 ?7353  ?WBC 9.4  --   ?CREATININE 1.78*  --   ?LATICACIDVEN 2.9* 3.2*  ?  ?Estimated Creatinine Clearance: 34.6 mL/min (A) (by C-G formula based on SCr of 1.78 mg/dL (H)).   ? ?No Known Allergies ? ?Antimicrobials this admission: ?4/17 vancomycin >>  ?4/17 cefepime >>  ?4/17 metronidazole >>  ? ?Dose adjustments this admission: n/a ? ?Microbiology results: ?4/17 BCx: sent ?4/17 UCx: sent  ? ?Thank you for allowing pharmacy to be a part of this patient?s care. ? ?Lamyiah Crawshaw A ?03/19/2022 7:44 PM ?

## 2022-03-25 NOTE — Assessment & Plan Note (Signed)
Obtain urine electrolytes and rehydrate likely secondary to dehydration ?

## 2022-03-25 NOTE — Assessment & Plan Note (Signed)
chronic stable ?

## 2022-03-25 NOTE — Assessment & Plan Note (Signed)
-  admit per? cellulitis protocol will  ?    continue current antibiotic choice ? Will obtain MRSA screening,  ?   obtain blood cultures  ?    further antibiotic adjustment pending above results ? ?

## 2022-03-25 NOTE — Assessment & Plan Note (Signed)
Likely contributing to increased confusion.  If able to tolerate could benefit from addition of Keppra for seizure prevention ?Palliative care consult family would like to have further discussion with other family members regarding overall goals of care at this point elected to avoid over aggressive interventions ?

## 2022-03-25 NOTE — Subjective & Objective (Signed)
Hx of metastatic squamous cell carcinoma of the lung with mets to brain and vertebra ?Had recently been hospitalized for possible ischemic left lower extremity supposed to be on Xarelto but since he lost his prescription has not been taking it ?In the past was admitted to [facility] but left AMA ?During recent admission in April 2 was stone for possible ischemic vascular surgery was consulted but there was no plan for operative intervention ABI bilaterally showed no evidence of ischemia patient declined PT OT care of this palliative patient stated he does not believe he has any cancer patient has history of urinary retention with longstanding Foley catheter he was discharged home on 10 April ?Since discharge she continued not to take any of his medications in general has not been feeling well. ?Today his daughter found patient on the couch lethargic confused covered in feces for sacral wound as well as glycemia.  He was found to be initially hypotensive with systolic blood pressures in the was given 500 normal blood pressure improved to 124/78 ?Patient is chronically on 2L of oxygen ? ?

## 2022-03-25 NOTE — Progress Notes (Signed)
A consult was received from an ED physician for vancomycin and cefepime per pharmacy dosing (for an indication other than meningitis). The patient's profile has been reviewed for ht/wt/allergies/indication/available labs. A one time order has been placed for the above antibiotics.  Further antibiotics/pharmacy consults should be ordered by admitting physician if indicated.       ?                ?Reuel Boom, PharmD, BCPS ?[phone] ?03/30/2022, 4:32 PM ? ?

## 2022-03-25 NOTE — Assessment & Plan Note (Signed)
Patient would likely benefit from SNF placement if family patient agrees versus [facility] placement ?

## 2022-03-25 NOTE — Assessment & Plan Note (Signed)
Secondary to metastatic disease.  Bilateral leg weakness which is chronic. ?

## 2022-03-25 NOTE — Assessment & Plan Note (Addendum)
Discussed with family, palliative care consult would be helpful, at this time pt to be DNR/DNI avoid over aggressive interventions but family would like further discussion prior to proceeding to possible comfort care  ?

## 2022-03-25 NOTE — Assessment & Plan Note (Signed)
continue home meds ?

## 2022-03-25 NOTE — H&P (Addendum)
? ? ? ?Randall Valencia ZOX:[id number] DOB: Jul 13, 1961 DOA: 04/07/2022 ? ? ?  ?PCP: Patient, No Pcp Per (Inactive)   ?Outpatient Specialists:   ?  ? Oncology  Marcia Brash, NP  [facility] ?  ? ?Patient arrived to ER on 03/20/2022 at 1551 ?Referred by Attending Valarie Merino, MD ? ? ?Patient coming from:   ? home Lives alone,     ?  ? ?Chief Complaint:   ?Chief Complaint  ?Patient presents with  ? Code Sepsis  ? ? ?HPI: ?Randall Valencia is a 61 y.o. 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 ?Presented with   confusion and generalized weakness ?Hx of metastatic squamous cell carcinoma of the lung with mets to brain and vertebra ?Had recently been hospitalized for possible ischemic left lower extremity supposed to be on Xarelto but since he lost his prescription has not been taking it ?In the past was admitted to [facility] but left AMA ?During recent admission in April 2 was stone for possible ischemic vascular surgery was consulted but there was no plan for operative intervention ABI bilaterally showed no evidence of ischemia patient declined PT OT care of this palliative patient stated he does not believe he has any cancer patient has history of urinary retention with longstanding Foley catheter he was discharged home on 10 April ?Since discharge she continued not to take any of his medications in general has not been feeling well. ?Today his daughter found patient on the couch lethargic confused covered in feces for sacral wound as well as glycemia.  He was found to be initially hypotensive with systolic blood pressures in the was given 500 normal blood pressure improved to 124/78 ?Patient is chronically on 2L of oxygen ?  ? Initial COVID TEST  ?NEGATIVE  ? ?Lab Results   ?Component Value Date  ? [place] NEGATIVE 03/18/2022  ? [place] NEGATIVE 02/21/2022  ? [place] NEGATIVE 08/20/2021  ? [place] NEGATIVE 05/11/2019  ? ?  ?Regarding pertinent Chronic problems:   ?  ? ? Hypothyroidism:  ?Lab Results  ?Component Value Date  ? TSH 3.183 08/20/2021  ? on synthroid ?  ? ?  COPD - not  followed by pulmonology    on baseline oxygen  2L,   ? ?   ? BPH - on Flomax,  ?    ?  Chronic anemia - baseline hg Hemoglobin & Hematocrit  ?Recent Labs  ?  03/15/22 ?2355 03/17/22 ?0411 03/22/2022 ?1605  ?HGB 11.5* 9.0* 10.8*  ? ? ? ?While in ER: ?  ?Was found to be meeting sepsis criteria IV fluids given started on vancomycin and cefepime noted to have significant source of infection such as an infection on his right leg with purulent drainage ? ? ? ?Ordered ? ?CT HEAD Interval increase in the size of a hemorrhagic lesion in the left ?caudate head with adjacent edema, with additional lesions seen in ?the posterior frontal lobe and possible lesions in the right ?parietal and left occipital lobes which remain concerning for ?hemorrhagic metastatic disease. ? ?CXR - Continued left lower lobe consolidation consistent with lung ?cancer. ?2. Destructive T7 lesion consistent with metastatic disease. This is ?better visualized on recent MRI. ?  ?Following Medications were ordered in ER: ?Medications  ?metroNIDAZOLE (FLAGYL) IVPB 500 mg (0 mg Intravenous Stopped 03/11/2022 1735)  ?  vancomycin (VANCOCIN) IVPB 1000 mg/200 mL premix (1,000 mg Intravenous New Bag/Given 03/26/2022 1829)  ?lactated ringers bolus 1,000 mL (1,000 mLs Intravenous New Bag/Given 03/24/2022 1706)  ?lactated ringers bolus 1,000 mL (1,000 mLs Intravenous New Bag/Given 03/27/2022 1634)  ?ceFEPIme (MAXIPIME) 2 g in sodium chloride 0.9 % 100 mL IVPB (0 g Intravenous Stopped 03/24/2022 1748)  ?  ?__  ?  ?ED Triage Vitals  ?Enc Vitals Group  ?   BP 04/04/2022 1639 103/69  ?   Pulse Rate 03/15/2022 1639 (!) 106  ?   Resp 03/15/2022 1630 (!) 25  ?   Temp  03/24/2022 1639 97.7 ?F (36.5 ?C)  ?   Temp Source 03/27/2022 1639 Oral  ?   SpO2 03/10/2022 1639 90 %  ?   Weight --   ?   Height --   ?   Head Circumference --   ?   Peak Flow --   ?   Pain Score --   ?   Pain Loc --   ?   Pain Edu? --   ?   Excl. in [place]? --   ?LYYT(03)@    ? _________________________________________ ?Significant initial  Findings: ?Abnormal Labs Reviewed  ?COMPREHENSIVE METABOLIC PANEL - Abnormal; Notable for the following components:  ?    Result Value  ? Sodium 132 (*)   ? BUN 53 (*)   ? Creatinine, Ser 1.78 (*)   ? Calcium 7.8 (*)   ? Total Protein 5.6 (*)   ? Albumin 2.1 (*)   ? AST 50 (*)   ? GFR, Estimated 43 (*)   ? All other components within normal limits  ?LACTIC ACID, PLASMA - Abnormal; Notable for the following components:  ? Lactic Acid, Venous 2.9 (*)   ? All other components within normal limits  ?CBC WITH DIFFERENTIAL/PLATELET - Abnormal; Notable for the following components:  ? RBC 3.36 (*)   ? Hemoglobin 10.8 (*)   ? HCT 31.9 (*)   ? RDW 15.8 (*)   ? Platelets 78 (*)   ? All other components within normal limits  ?CK - Abnormal; Notable for the following components:  ? Total CK 681 (*)   ? All other components within normal limits  ?BLOOD GAS, ARTERIAL - Abnormal; Notable for the following components:  ? pH, Arterial 7.47 (*)   ? pCO2 arterial 31 (*)   ? pO2, Arterial 66 (*)   ? All other components within normal limits  ? ?  ?ECG: Ordered ?Personally reviewed by me showing: ?HR : 107 ?Rhythm: Sinus tachycardia   ?Borderline right axis deviation ?Probable LVH with secondary repol abnrm ?QTC 453 ? ? ?____________________ ?This patient meets SIRS Criteria and may be septic.  ?The recent clinical data is shown below. ?Vitals:  ? 03/24/2022 1745 03/23/2022 1800 03/14/2022 1815 03/27/2022 1830  ?BP: 117/71 110/70 109/72 109/86  ?Pulse: (!) 110  (!) 109 (!) 109  ?Resp: (!) 26 (!) 30 (!) 29 (!) 28  ?Temp:      ?TempSrc:      ?SpO2:    90%  ?  ?WBC ? ?   ?Component Value Date/Time  ? WBC 9.4  04/07/2022 1605  ? LYMPHSABS PENDING 03/30/2022 1605  ? MONOABS PENDING 03/24/2022 1605  ? EOSABS PENDING 03/11/2022 1605  ? BASOSABS PENDING 03/10/2022 1605  ?  ?Lactic Acid, Venous ?   ?Component Value Date/Time  ? LATICACIDVEN 2.9 ([place]) 03/15/2022 1605  ?  ? ?Procalcitonin   Ordered ?Lactic Acid,  Venous ?   ?Component Value Date/Time  ? LATICACIDVEN 3.2 ([place]) 03/26/2022 1805  ? ?  ? ? UA   evidence of UTI    ?  ?Urine analysis: ?   ?Component Value Date/Time  ? [place] YELLOW 03/14/2022 1847  ? APPEARANCEUR CLOUDY (A) 03/12/2022 1847  ? LABSPEC 1.016 03/24/2022 1847  ? PHURINE 5.0 03/21/2022 1847  ? GLUCOSEU NEGATIVE 03/23/2022 1847  ? HGBUR LARGE (A) 03/12/2022 1847  ? [place] NEGATIVE 03/29/2022 1847  ? [place] NEGATIVE 03/28/2022 1847  ? PROTEINUR 100 (A) 03/24/2022 1847  ? NITRITE NEGATIVE 03/17/2022 1847  ? LEUKOCYTESUR LARGE (A) 03/20/2022 1847  ? ? ?Results for orders placed or performed during the hospital encounter of 03/13/2022  ?Resp Panel by RT-PCR (Flu A&B, Covid) Nasopharyngeal Swab     Status: None  ? Collection Time: 03/19/2022  5:26 PM  ? Specimen: Nasopharyngeal Swab; Nasopharyngeal(NP) swabs in vial transport medium  ?Result Value Ref Range Status  ? SARS Coronavirus 2 by RT PCR NEGATIVE NEGATIVE Final  ?      ? Influenza A by PCR NEGATIVE NEGATIVE Final  ? Influenza B by PCR NEGATIVE NEGATIVE Final  ?      ? ? ? ?_______________________________________________ ?Hospitalist was called for admission for   Altered mental status, unspecified altered mental status type  ?Hypotension, unspecified hypotension type ?   ?Sepsis, due to unspecified organism, unspecified whether acute organ dysfunction present [facility]) ?   ?AKI (acute kidney injury) ([place]) ?   ?Abscess ?  ? ? ? ?The following Work up has been ordered so far: ? ?Orders Placed This Encounter  ?Procedures  ? Culture, blood (Routine x 2)  ? Resp Panel by RT-PCR (Flu A&B, Covid) Nasopharyngeal Swab  ? DG Chest 2 View  ? CT Head Wo Contrast  ?  DG Chest Port 1 View  ? Comprehensive metabolic panel  ? Lactic acid, plasma  ? CBC with Differential  ? Protime-INR  ? Urinalysis, Routine w reflex microscopic  ? CK  ? Blood gas, arterial  ? Notify physician (specify)

## 2022-03-25 NOTE — Progress Notes (Signed)
Notified Lab that ABG being sent for analysis. 

## 2022-03-25 NOTE — Assessment & Plan Note (Signed)
progressive worsening, possibly cancer related? Given increased hemorrhagic lesions in the brain will hold off on anticoagulation, hold Xarelto pt has not been taking  ?

## 2022-03-25 NOTE — Telephone Encounter (Signed)
Pharmacy Transitions of Care Follow-up Telephone Call ? ?Date of discharge: 03/18/22  ?Discharge Diagnosis: Ischemic left lower extremity ? ?How have you been since you were released from the hospital? Patient was under the weather and unable to talk, his caregiver stated that he had not been taking any of his medications because he didn't want to. I strongly encouraged him to take all medications, especially Xarelto, but he also stated he has lost his RX of Xarelto. [facility] will call [facility] and get them to refill and try to get an early fill override.  ? ?Medication changes made at discharge: ?CONTINUE taking these medications ? ?CONTINUE taking these medications  ?albuterol 108 (90 Base) MCG/ACT inhaler ?Commonly known as: VENTOLIN HFA ?Inhale 2 puffs into the lungs every 6 (six) hours as needed for wheezing or shortness of breath.   ?aspirin 81 MG chewable tablet   ?bisacodyl 10 MG suppository ?Commonly known as: DULCOLAX   ?* Cholecalciferol 1.25 MG (50000 UT) capsule   ?* Cholecalciferol 50 MCG (2000 UT) Tabs ?Start taking on: Apr 19, 2022   ?lactulose 10 GM/15ML solution ?Commonly known as: CHRONULAC   ?levothyroxine 50 MCG tablet ?Commonly known as: SYNTHROID   ?naloxone 4 MG/0.1ML Liqd nasal spray kit ?Commonly known as: NARCAN   ?omeprazole 20 MG tablet ?Commonly known as: PRILOSEC OTC   ?oxyCODONE 15 MG immediate release tablet ?Commonly known as: ROXICODONE   ?polyethylene glycol powder 17 GM/SCOOP powder ?Commonly known as: GLYCOLAX/MIRALAX ?Take 17 g by mouth daily.   ?pregabalin 50 MG capsule ?Commonly known as: LYRICA   ?prochlorperazine 10 MG tablet ?Commonly known as: COMPAZINE   ?Senexon-S 8.6-50 MG tablet ?Generic drug: senna-docusate ?Take 2 tablets by mouth daily.   ?Spiriva Respimat 1.25 MCG/ACT Aers ?Generic drug: Tiotropium Bromide Monohydrate ?Inhale 2 puffs into the lungs daily.   ?Symbicort 160-4.5 MCG/ACT inhaler ?Generic drug: budesonide-formoterol ?Inhale 2 puffs into the lungs 2 (two) times  daily.   ?tamsulosin 0.4 MG Caps capsule ?Commonly known as: FLOMAX ?Take 1 capsule (0.4 mg total) by mouth daily.   ?thiamine 100 MG tablet ?Take 1 tablet (100 mg total) by mouth daily.   ?Xarelto 20 MG Tabs tablet ?Generic drug: rivaroxaban ?Take 1 tablet (20 mg total) by mouth daily with supper.   ? ? ?Medication changes verified by the patient?  No ?  ? ?Medication Accessibility: ? ?[facility] [address] [place]  ? ?Was the patient provided with refills on discharged medications? yes  ? ?Is the patient able to afford medications? yes ?Notable copays: 0 ?Eligible patient assistance: no ?  ? ?Medication Review: ?RIVAROXABAN (XARELTO) (RESTARTING) ?Rivaroxaban 60m QD ?- Discussed importance of taking medication with food and around the same time everyday  ?- Reviewed potential DDIs with patient  ?- Advised patient of medications to avoid (NSAIDs, ASA)  ?- Educated that Tylenol (acetaminophen) will be the preferred analgesic to prevent risk of bleeding  ?- Emphasized importance of monitoring for signs and symptoms of bleeding (abnormal bruising, prolonged bleeding, nose bleeds, bleeding from gums, discolored urine, black tarry stools)  ?- Advised patient to alert all providers of anticoagulation therapy prior to starting a new medication or having a procedure  ? ?Final Patient Assessment: ?Patient was under the weather and unable to talk, his caregiver stated that he had not been taking any of his medications because he didn't want to. I strongly encouraged him to take all medications, especially Xarelto, but he also stated he has lost his RX of Xarelto. [facility]  [facility] and [facility] and try to get an early fill override.  ? ?Darcus Austin, PharmD ?Clinical Pharmacist ?[facility] ?03/14/2022 11:58 AM ? ?

## 2022-03-25 NOTE — Assessment & Plan Note (Signed)
Rehydrate follow fluid status repeat CK in AM.  Check other electrolytes patient appears to be malnourished check prealbumin ?Magnesium and phosphate ?

## 2022-03-25 NOTE — Assessment & Plan Note (Signed)
Rehydrate follow fluid status ?

## 2022-03-25 NOTE — Assessment & Plan Note (Signed)
this patient has acute respiratory failure with Hypoxia as documented by the presence of following: ?O2 saturatio< 90% on RA Likely due to: COPD , sepsis ?Provide O2 therapy and titrate as needed ? Continuous pulse ox ?  ? ?

## 2022-03-25 NOTE — Assessment & Plan Note (Addendum)
Family would like to hold off on Wound care consult for now as they are leaning to possible comfort care ?

## 2022-03-25 NOTE — ED Triage Notes (Signed)
Pt arrived via [facility] from home.  ? ?Per ems pt lives alone.  ? ?Daughter found pt today on the couch lethargic and unable to move.  ? ?Multiple sources of infection: Foley, right knee, and possible sacrum wounds.  ? ?Pt has edema in lower extremities.  ? ?Possible rhabdo per ems  ? ?Initially hypotensive at BP-74-58 ? ?Pt gave 500 NS- ? ?BP after fluids-124/78 ? ?Entitle of 25 ?RR-40 ? ?Cbg-157 ?T-97.9 ? ? ?

## 2022-03-25 NOTE — ED Notes (Signed)
Respiratory called for ABG. ? ?Unable to get a rectal tempt due to wounds on rectum. EDP aware.  ? ?Multiple sacrum and buttock wounds.  ? ?Wound drainage from right knee.  ? ?Multiple wounds all over body.  ?

## 2022-03-25 NOTE — Assessment & Plan Note (Signed)
In the setting of underlying infection, dehydration and metastatic disease to the brain.  We will rehydrate treat underlying infection. ?Start patient on Keppra ?Palliative care consult will follow better goals of care discussion with family ?

## 2022-03-25 NOTE — Assessment & Plan Note (Signed)
Will be able to be covered with cefepime ?Clear for UTI versus contamination given patient altered mental status and evidence of sepsis we will continue to follow ?

## 2022-03-25 NOTE — ED Notes (Signed)
Patient transported to CT 

## 2022-03-25 NOTE — Assessment & Plan Note (Signed)
Nutritional consult check prealbumin ?

## 2022-03-26 ENCOUNTER — Encounter (HOSPITAL_COMMUNITY): Payer: Self-pay | Admitting: Internal Medicine

## 2022-03-26 ENCOUNTER — Other Ambulatory Visit: Payer: Self-pay

## 2022-03-26 DIAGNOSIS — L03119 Cellulitis of unspecified part of limb: Secondary | ICD-10-CM | POA: Diagnosis not present

## 2022-03-26 DIAGNOSIS — A419 Sepsis, unspecified organism: Secondary | ICD-10-CM | POA: Diagnosis not present

## 2022-03-26 DIAGNOSIS — R6520 Severe sepsis without septic shock: Secondary | ICD-10-CM | POA: Diagnosis not present

## 2022-03-26 DIAGNOSIS — Z7189 Other specified counseling: Secondary | ICD-10-CM

## 2022-03-26 DIAGNOSIS — J9621 Acute and chronic respiratory failure with hypoxia: Secondary | ICD-10-CM | POA: Diagnosis not present

## 2022-03-26 DIAGNOSIS — G9520 Unspecified cord compression: Secondary | ICD-10-CM | POA: Diagnosis not present

## 2022-03-26 DIAGNOSIS — Z515 Encounter for palliative care: Secondary | ICD-10-CM

## 2022-03-26 LAB — BLOOD CULTURE ID PANEL (REFLEXED) - BCID2
A.calcoaceticus-baumannii: NOT DETECTED
Bacteroides fragilis: NOT DETECTED
CTX-M ESBL: NOT DETECTED
Candida albicans: NOT DETECTED
Candida auris: NOT DETECTED
Candida glabrata: NOT DETECTED
Candida krusei: NOT DETECTED
Candida parapsilosis: NOT DETECTED
Candida tropicalis: NOT DETECTED
Carbapenem resist OXA 48 LIKE: NOT DETECTED
Carbapenem resistance IMP: NOT DETECTED
Carbapenem resistance KPC: NOT DETECTED
Carbapenem resistance NDM: NOT DETECTED
Carbapenem resistance VIM: NOT DETECTED
Cryptococcus neoformans/gattii: NOT DETECTED
Enterobacter cloacae complex: NOT DETECTED
Enterobacterales: DETECTED — AB
Enterococcus Faecium: NOT DETECTED
Enterococcus faecalis: NOT DETECTED
Escherichia coli: DETECTED — AB
Haemophilus influenzae: NOT DETECTED
Klebsiella aerogenes: NOT DETECTED
Klebsiella oxytoca: NOT DETECTED
Klebsiella pneumoniae: NOT DETECTED
Listeria monocytogenes: NOT DETECTED
Neisseria meningitidis: NOT DETECTED
Proteus species: NOT DETECTED
Pseudomonas aeruginosa: NOT DETECTED
Salmonella species: NOT DETECTED
Serratia marcescens: NOT DETECTED
Staphylococcus aureus (BCID): NOT DETECTED
Staphylococcus epidermidis: NOT DETECTED
Staphylococcus lugdunensis: NOT DETECTED
Staphylococcus species: NOT DETECTED
Stenotrophomonas maltophilia: NOT DETECTED
Streptococcus agalactiae: NOT DETECTED
Streptococcus pneumoniae: NOT DETECTED
Streptococcus pyogenes: NOT DETECTED
Streptococcus species: DETECTED — AB

## 2022-03-26 LAB — COMPREHENSIVE METABOLIC PANEL
ALT: 18 U/L (ref 0–44)
AST: 49 U/L — ABNORMAL HIGH (ref 15–41)
Albumin: 1.5 g/dL — ABNORMAL LOW (ref 3.5–5.0)
Alkaline Phosphatase: 62 U/L (ref 38–126)
Anion gap: 10 (ref 5–15)
BUN: 47 mg/dL — ABNORMAL HIGH (ref 8–23)
CO2: 21 mmol/L — ABNORMAL LOW (ref 22–32)
Calcium: 7.1 mg/dL — ABNORMAL LOW (ref 8.9–10.3)
Chloride: 103 mmol/L (ref 98–111)
Creatinine, Ser: 1.57 mg/dL — ABNORMAL HIGH (ref 0.61–1.24)
GFR, Estimated: 50 mL/min — ABNORMAL LOW (ref >60)
Glucose, Bld: 65 mg/dL — ABNORMAL LOW (ref 70–99)
Potassium: 3.7 mmol/L (ref 3.5–5.1)
Sodium: 134 mmol/L — ABNORMAL LOW (ref 135–145)
Total Bilirubin: 1 mg/dL (ref 0.3–1.2)
Total Protein: 4.3 g/dL — ABNORMAL LOW (ref 6.5–8.1)

## 2022-03-26 LAB — TSH: TSH: 19.017 u[IU]/mL — ABNORMAL HIGH (ref 0.350–4.500)

## 2022-03-26 LAB — CBC
HCT: 25.8 % — ABNORMAL LOW (ref 39.0–52.0)
Hemoglobin: 8.6 g/dL — ABNORMAL LOW (ref 13.0–17.0)
MCH: 31.7 pg (ref 26.0–34.0)
MCHC: 33.3 g/dL (ref 30.0–36.0)
MCV: 95.2 fL (ref 80.0–100.0)
Platelets: 63 10*3/uL — ABNORMAL LOW (ref 150–400)
RBC: 2.71 MIL/uL — ABNORMAL LOW (ref 4.22–5.81)
RDW: 15.9 % — ABNORMAL HIGH (ref 11.5–15.5)
WBC: 8.7 10*3/uL (ref 4.0–10.5)
nRBC: 0 % (ref 0.0–0.2)

## 2022-03-26 LAB — CBG MONITORING, ED: Glucose-Capillary: 62 mg/dL — ABNORMAL LOW (ref 70–99)

## 2022-03-26 LAB — MAGNESIUM: Magnesium: 1.9 mg/dL (ref 1.7–2.4)

## 2022-03-26 LAB — CK: Total CK: 560 U/L — ABNORMAL HIGH (ref 49–397)

## 2022-03-26 LAB — MRSA NEXT GEN BY PCR, NASAL: MRSA by PCR Next Gen: DETECTED — AB

## 2022-03-26 LAB — PHOSPHORUS: Phosphorus: 3 mg/dL (ref 2.5–4.6)

## 2022-03-26 MED ORDER — LACTULOSE 10 GM/15ML PO SOLN: 10.0000 g | Freq: Three times a day (TID) | ORAL | Status: DC | PRN

## 2022-03-26 MED ORDER — VANCOMYCIN HCL 750 MG/150ML IV SOLN
750.0000 mg | INTRAVENOUS | Status: DC
  Filled 2022-03-26: qty 150

## 2022-03-26 MED ORDER — GLYCOPYRROLATE 0.2 MG/ML IJ SOLN
0.2000 mg | INTRAMUSCULAR | Status: DC | PRN
  Administered 2022-03-27 – 2022-03-29 (×2): 0.2 mg via INTRAVENOUS
  Filled 2022-03-26 (×2): qty 1

## 2022-03-26 MED ORDER — METHOCARBAMOL 1000 MG/10ML IJ SOLN
500.0000 mg | Freq: Four times a day (QID) | INTRAVENOUS | Status: DC | PRN
  Filled 2022-03-26: qty 5

## 2022-03-26 MED ORDER — CEFTRIAXONE SODIUM 2 G IJ SOLR
2.0000 g | INTRAMUSCULAR | Status: DC
  Administered 2022-03-26: 2 g via INTRAVENOUS
  Filled 2022-03-26: qty 20

## 2022-03-26 MED ORDER — IPRATROPIUM-ALBUTEROL 0.5-2.5 (3) MG/3ML IN SOLN
3.0000 mL | Freq: Four times a day (QID) | RESPIRATORY_TRACT | Status: DC
  Filled 2022-03-26: qty 3

## 2022-03-26 MED ORDER — MUPIROCIN 2 % EX OINT: 1.0000 "application " | TOPICAL_OINTMENT | Freq: Two times a day (BID) | CUTANEOUS | Status: DC

## 2022-03-26 MED ORDER — HALOPERIDOL LACTATE 5 MG/ML IJ SOLN
0.5000 mg | INTRAMUSCULAR | Status: DC | PRN
  Administered 2022-03-29: 0.5 mg via INTRAVENOUS
  Filled 2022-03-26: qty 1

## 2022-03-26 MED ORDER — ACETAMINOPHEN 325 MG PO TABS: 650.0000 mg | ORAL_TABLET | Freq: Four times a day (QID) | ORAL | Status: DC | PRN

## 2022-03-26 MED ORDER — THIAMINE HCL 100 MG/ML IJ SOLN
100.0000 mg | Freq: Every day | INTRAMUSCULAR | Status: DC
  Administered 2022-03-26: 100 mg via INTRAVENOUS
  Filled 2022-03-26: qty 2

## 2022-03-26 MED ORDER — DEXTROSE-NACL 5-0.9 % IV SOLN: INTRAVENOUS | Status: DC

## 2022-03-26 MED ORDER — ALBUTEROL SULFATE HFA 108 (90 BASE) MCG/ACT IN AERS: 2.0000 | INHALATION_SPRAY | Freq: Four times a day (QID) | RESPIRATORY_TRACT | Status: DC | PRN

## 2022-03-26 MED ORDER — ACETAMINOPHEN 650 MG RE SUPP
650.0000 mg | Freq: Four times a day (QID) | RECTAL | Status: DC | PRN
Start: 2022-03-26 — End: 2022-03-31

## 2022-03-26 MED ORDER — LORAZEPAM 2 MG/ML IJ SOLN
1.0000 mg | INTRAMUSCULAR | Status: DC | PRN
Start: 2022-03-26 — End: 2022-03-31

## 2022-03-26 MED ORDER — LORAZEPAM 2 MG/ML PO CONC: 1.0000 mg | ORAL | Status: DC | PRN

## 2022-03-26 MED ORDER — SODIUM CHLORIDE 0.9 % IV SOLN
75.0000 mL/h | INTRAVENOUS | Status: DC
  Administered 2022-03-26: 75 mL/h via INTRAVENOUS

## 2022-03-26 MED ORDER — MOMETASONE FURO-FORMOTEROL FUM 200-5 MCG/ACT IN AERO
2.0000 | INHALATION_SPRAY | Freq: Two times a day (BID) | RESPIRATORY_TRACT | Status: DC
Start: 2022-03-26 — End: 2022-03-26
  Filled 2022-03-26: qty 8.8

## 2022-03-26 MED ORDER — CHLORHEXIDINE GLUCONATE CLOTH 2 % EX PADS
6.0000 | MEDICATED_PAD | Freq: Every day | CUTANEOUS | Status: DC
  Administered 2022-03-27 – 2022-03-28 (×2): 6 via TOPICAL

## 2022-03-26 MED ORDER — HALOPERIDOL LACTATE 2 MG/ML PO CONC
0.5000 mg | ORAL | Status: DC | PRN
  Filled 2022-03-26: qty 0.3

## 2022-03-26 MED ORDER — ACETAMINOPHEN 10 MG/ML IV SOLN
1000.0000 mg | Freq: Once | INTRAVENOUS | Status: AC
  Administered 2022-03-26: 1000 mg via INTRAVENOUS
  Filled 2022-03-26: qty 100

## 2022-03-26 MED ORDER — HALOPERIDOL 0.5 MG PO TABS
0.5000 mg | ORAL_TABLET | ORAL | Status: DC | PRN
  Filled 2022-03-26: qty 1

## 2022-03-26 MED ORDER — GLYCOPYRROLATE 1 MG PO TABS
1.0000 mg | ORAL_TABLET | ORAL | Status: DC | PRN
  Filled 2022-03-26: qty 1

## 2022-03-26 MED ORDER — LORAZEPAM 1 MG PO TABS: 1.0000 mg | ORAL_TABLET | ORAL | Status: DC | PRN

## 2022-03-26 MED ORDER — GLYCOPYRROLATE 0.2 MG/ML IJ SOLN: 0.2000 mg | INTRAMUSCULAR | Status: DC | PRN

## 2022-03-26 MED ORDER — ALBUTEROL SULFATE (2.5 MG/3ML) 0.083% IN NEBU: 2.5000 mg | INHALATION_SOLUTION | Freq: Four times a day (QID) | RESPIRATORY_TRACT | Status: DC | PRN

## 2022-03-26 MED ORDER — OXYCODONE HCL 20 MG/ML PO CONC
5.0000 mg | ORAL | Status: DC | PRN
  Administered 2022-03-26 – 2022-03-27 (×5): 10 mg via ORAL
  Filled 2022-03-26 (×5): qty 1

## 2022-03-26 NOTE — Progress Notes (Signed)
PHARMACY - PHYSICIAN COMMUNICATION ?CRITICAL VALUE ALERT - BLOOD CULTURE IDENTIFICATION (BCID) ? ?Randall Valencia is an 61 y.o. male who presented to [facility] on 03/26/2022 with a chief complaint of Sepsis, Cellulitis and abscess of leg.  PMH metastatic squamous cell lung cancer with mets to spine/brain causing paralysis, urinary retention, and chronic hypoxia.   ? ?Assessment:  Blood culture 4/4 bottles with both GPC and GNR.  BCID shows both Strep species and Escherichia coli. ?(Streptococcus species, Not Enterococcus species, Streptococcus agalactiae, Streptococcus pyogenes, or Streptococcus pneumoniae) ? ?Name of physician (or Provider) Contacted: Dr. Nevada Crane ? ?Current antibiotics: Cefepime and Vancomycin ? ?Changes to prescribed antibiotics recommended:  ?Recommendations accepted by provider ?Ceftriaxone 2g IV q24h ? ?Results for orders placed or performed during the hospital encounter of 03/15/2022  ?Blood Culture ID Panel (Reflexed) (Collected: 03/13/2022  4:22 PM)  ?Result Value Ref Range  ? Enterococcus faecalis NOT DETECTED NOT DETECTED  ? Enterococcus Faecium NOT DETECTED NOT DETECTED  ? Listeria monocytogenes NOT DETECTED NOT DETECTED  ? Staphylococcus species NOT DETECTED NOT DETECTED  ? Staphylococcus aureus (BCID) NOT DETECTED NOT DETECTED  ? Staphylococcus epidermidis NOT DETECTED NOT DETECTED  ? Staphylococcus lugdunensis NOT DETECTED NOT DETECTED  ? Streptococcus species DETECTED (A) NOT DETECTED  ? Streptococcus agalactiae NOT DETECTED NOT DETECTED  ? Streptococcus pneumoniae NOT DETECTED NOT DETECTED  ? Streptococcus pyogenes NOT DETECTED NOT DETECTED  ? A.calcoaceticus-baumannii NOT DETECTED NOT DETECTED  ? Bacteroides fragilis NOT DETECTED NOT DETECTED  ? Enterobacterales DETECTED (A) NOT DETECTED  ? Enterobacter cloacae complex NOT DETECTED NOT DETECTED  ? Escherichia coli DETECTED (A) NOT DETECTED  ? Klebsiella aerogenes NOT DETECTED NOT DETECTED  ? Klebsiella oxytoca NOT DETECTED NOT DETECTED  ?  Klebsiella pneumoniae NOT DETECTED NOT DETECTED  ? Proteus species NOT DETECTED NOT DETECTED  ? Salmonella species NOT DETECTED NOT DETECTED  ? Serratia marcescens NOT DETECTED NOT DETECTED  ? Haemophilus influenzae NOT DETECTED NOT DETECTED  ? Neisseria meningitidis NOT DETECTED NOT DETECTED  ? Pseudomonas aeruginosa NOT DETECTED NOT DETECTED  ? Stenotrophomonas maltophilia NOT DETECTED NOT DETECTED  ? Candida albicans NOT DETECTED NOT DETECTED  ? Candida auris NOT DETECTED NOT DETECTED  ? Candida glabrata NOT DETECTED NOT DETECTED  ? Candida krusei NOT DETECTED NOT DETECTED  ? Candida parapsilosis NOT DETECTED NOT DETECTED  ? Candida tropicalis NOT DETECTED NOT DETECTED  ? Cryptococcus neoformans/gattii NOT DETECTED NOT DETECTED  ? CTX-M ESBL NOT DETECTED NOT DETECTED  ? Carbapenem resistance IMP NOT DETECTED NOT DETECTED  ? Carbapenem resistance KPC NOT DETECTED NOT DETECTED  ? Carbapenem resistance NDM NOT DETECTED NOT DETECTED  ? Carbapenem resist OXA 48 LIKE NOT DETECTED NOT DETECTED  ? Carbapenem resistance VIM NOT DETECTED NOT DETECTED  ? ? ?Gretta Arab PharmD, BCPS ?Clinical Pharmacist ?[facility] main pharmacy [phone] ?03/26/2022 7:08 AM ? ? ?

## 2022-03-26 NOTE — Plan of Care (Signed)
  Problem: Pain Managment: Goal: General experience of comfort will improve Outcome: Progressing   Problem: Safety: Goal: Ability to remain free from injury will improve Outcome: Progressing   

## 2022-03-26 NOTE — ED Notes (Signed)
At present time pts son in in [place]. They are aware of pts situation and trying to work toward pt being released to come to the hospital. ?

## 2022-03-26 NOTE — Progress Notes (Signed)
SLP Cancellation Note ? ?Patient Details ?Name: Randall Valencia ?MRN: [id number] ?DOB: [birth year] ? ? ?Cancelled treatment:       Reason Eval/Treat Not Completed: (P) Fatigue/lethargy limiting ability to participate (pt asleep, no family present at this time; will continue efforts) ? ? ?[name] ?03/26/2022, 2:15 PM ? ? ?

## 2022-03-26 NOTE — ED Notes (Signed)
Pt resting without s/s of distress this morning, daughter is at bedside. Will send message to admitting as daughter has questions and would like an update. ?

## 2022-03-26 NOTE — Progress Notes (Signed)
Pharmacy Antibiotic Note ? ?Randall Valencia is a 61 y.o. male admitted on 03/26/2022 with sepsis.  PMH significant for lung cancer with mets to the brain and vertebra resulting in bilateral lower extremity paralysis.  ? ?BCID + for 4/4 bottles with GPC and GNR, identified as E.coli and streptococcus species for which patient is on ceftriaxone. Pharmacy consulted to dose vancomycin for lower extremity wound. ? ?Today, 03/26/22 ?WBC WNL ?SCr 1.57 slightly improved. CrCl ~39 mL/min ?TBW < IBW ? ?Plan: ?Continue ceftriaxone 2 g IV q24h for bacteremia ?Pt received a dose of vancomycin 1000 mg on 4/17 @ 1829. Will start maintenance dose of 750 mg IV q24h this evening for estimated AUC of 493 ?Goal vancomycin AUC 400-550. Check levels at steady state as needed ?Monitor renal function, sensitivities ?  ? ?Temp (24hrs), Avg:98.8 ?F (37.1 ?C), Min:97.7 ?F (36.5 ?C), Max:101 ?F (38.3 ?C) ? ?Recent Labs  ?Lab 04/01/2022 ?9379 04/05/2022 ?1805 03/23/2022 ?2040 03/26/22 ?0705  ?WBC 9.4  --   --  8.7  ?CREATININE 1.78*  --   --  1.57*  ?LATICACIDVEN 2.9* 3.2* 2.8*  --   ?  ?Estimated Creatinine Clearance: 39.3 mL/min (A) (by C-G formula based on SCr of 1.57 mg/dL (H)).   ? ?No Known Allergies ? ?Antimicrobials this admission: ?cefepime 4/17 >> 4/18 ?Metronidazole 4/17 >> 4/18 ?Ceftriaxone 4/18 >> ?Vancomycin 4/17 >> ? ?Dose adjustments this admission: ? ?Microbiology results: ?4/17 BCx: 4/4 GNR (E.coli), GPC (strep species) ? ?Lenis Noon, PharmD ?03/26/2022 4:31 PM ?

## 2022-03-26 NOTE — Progress Notes (Addendum)
? ?PROGRESS NOTE ? ?Randall Valencia ELF:[id number] DOB: 1961-02-13 DOA: 04/06/2022 ?PCP: Patient, No Pcp Per (Inactive) ? ?HPI/Recap of past 24 hours: ?Randall Valencia is a 61 y.o. 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 ? ?Had recently been hospitalized for possible ischemic left lower extremity, discharged on 03/18/2022, was supposed to be on Xarelto but since he lost his prescription has not been taking it.  In the past was admitted to [facility] but left AMA ? ?Since discharge he continued not to take any of his medications.  In general has not been feeling well. ? ?On the day of admission, his daughter found patient on the couch lethargic confused covered in feces for sacral wound as well as hypoglycemia.  He was found to be initially hypotensive. ? ?03/26/2022: The patient was seen and examined at his bedside in the ED.  Lethargic and minimally interactive.  His daughter is present at bedside. ? ?Assessment/Plan: ?Principal Problem: ?  Severe sepsis ([place]) ?Active Problems: ?  Severe protein-calorie malnutrition ([place]) ?  Squamous cell carcinoma of lung ([place]) ?  Dehydration, severe ?  Cord compression [facility]) ?  AKI (acute kidney injury) ([place]) ?  Pressure injury of skin ?  UTI (urinary tract infection) ?  Cellulitis and abscess of leg ?  Anemia associated with chemotherapy ?  Thrombocytopenia ([place]) ?  History of malignant neoplasm metastatic to brain ?  Acute metabolic encephalopathy ?  Elevated CK ?  Functional quadriplegia (HCC) ?  COPD (chronic obstructive pulmonary disease) ([place]) ?  Acute on chronic respiratory failure with hypoxia (HCC) ? ?Acute metabolic  encephalopathy, likely multifactorial ?Secondary to severe dehydration, sepsis, intracranial bleed from hemorrhagic metastasis.  Acute metabolic encephalopathy ? SBP<90 mmhg or MAP < 65 mmhg,  ? Acute hypoxia requiring new supplemental oxygen, SpO2: 90 % ?O2 Flow Rate (L/min): 4 L/min ?  ?Sepsis secondary to cellulitis and abscess of leg ?MRSA screening test positive. ?Blood cultures positive for E. coli 8, Streptococcus species, Enterobacter.  She is ? ?Acute thrombocytopenia with platelet < 100,000  ?Platelet count downtrending 63 from 78. ?Continue to closely monitor ?  ?Anemia associated with chemotherapy ?Hemoglobin is downtrending. ?  ?Thrombocytopenia ([place]) ?Progressive worsening, possibly cancer related? Given increased hemorrhagic lesions in the brain will hold off on anticoagulation, hold Xarelto pt has not been taking  ?  ?History of malignant neoplasm with hemorrhagic metastasis to brain ?Likely contributing to increased confusion.   ?Continue IV Keppra twice daily 6/6 ?Palliative care consult family would like to have further discussion with other family members regarding overall goals of care at this point elected to avoid over aggressive interventions ?  ?Elevated CK ?CPK is downtrending with IV fluid hydration. ? ?Severe protein calorie malnutrition ?Prealbumin 6.1 ? ?Hypoglycemia ?Start D5 normal saline ?Monitor CBGs ?  ?Presumptive UTI (urinary tract infection), POA ?Will be able to be covered with cefepime ?Follow urine culture for ID and sensitivities. ?  ?Functional quadriplegia ([place]) ?  ?COPD (chronic obstructive pulmonary disease) ([place]) ?continue home meds ?  ?Goals of care ?Palliative care team consulted to assist with establishing goals of care. 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  Poor functional status. ?  ? ? ?Critical care time: 65 minutes.   ? ? ?  ?DVT  prophylaxis:  SCD   ?  ?  ?  ?Code Status:    Code Status: DNR   as per  family  ?I had personally discussed CODE STATUS with patient and family  ?  ?Family Communication:   Family  at  Bedside ? plan of care was discussed  with   Daughter,  ?Disposition Plan:     likely will need placement for rehabilitation ?                           ? Following barriers for discharge: ?                           Electrolytes corrected ?                             ?                            Pain controlled with PO medications ?                              Afebrile, white count improving able to transition to PO antibiotics   ?                          Will likely need home health, home O2, set up ?                          ?  ?  ?                  Swallow eval - SLP ordered ?                   ?                  Nutrition    consulted ?                 ?                  Palliative care    consulted ?               ?                   ?Consults called: none ? ? ? ? ?Status is: Inpatient ?Patient requires at least 2 midnights for further evaluation and treatment of present condition. ? ? ? ?Objective: ?Vitals:  ? 03/26/22 0915 03/26/22 0933 03/26/22 1000 03/26/22 1100  ?BP: 109/70 109/70 112/67 109/65  ?Pulse:  (!) 107 (!) 106 99  ?Resp: (!) 24 (!) 24 (!) 24 17  ?Temp:  98.7 ?F (37.1 ?C)    ?TempSrc:  Oral    ?SpO2:  100% 99% 100%  ? ? ?Intake/Output Summary (Last 24 hours) at 03/26/2022 1115 ?Last data filed at 03/26/2022 0934 ?Gross per 24 hour  ?Intake 2786.72 ml  ?Output 500 ml  ?Net 2286.72 ml  ? ?There were no vitals filed for this visit. ? ?Exam: ? ?General: 61 y.o. year-old male frail-appearing  and lethargic. ?Cardiovascular: Regular rate and rhythm with no rubs or gallops.  No thyromegaly or JVD noted.   ?Respiratory: Clear to auscultation with no wheezes or rales. Good inspiratory effort. ?Abdomen: Soft nontender nondistended with normal bowel sounds x4 quadrants. ?Musculoskeletal: Positive lower extremity edema  bilaterally. ?Skin: Pressure wounds. ?Psychiatry: Unable to assess mood due to lethargy. ? ? ?Data Reviewed: ?CBC: ?Recent Labs  ?Lab 03/09/2022 ?1610 03/26/22 ?0705  ?WBC 9.4 8.7  ?NEUTROABS 8.6*  --   ?HGB 10.8* 8.6*  ?HCT 31.9* 25.8*  ?MCV 94.9 95.2  ?PLT 78* 63*  ? ?Basic Metabolic Panel: ?Recent Labs  ?Lab 04/01/2022 ?9604 03/26/22 ?0705  ?NA 132* 134*  ?K 4.3 3.7  ?CL 99 103  ?CO2 23 21*  ?GLUCOSE 95 65*  ?BUN 53* 47*  ?CREATININE 1.78* 1.57*  ?CALCIUM 7.8* 7.1*  ?MG 2.3 1.9  ?PHOS 3.7 3.0  ? ?GFR: ?Estimated Creatinine Clearance: 39.3 mL/min (A) (by C-G formula based on SCr of 1.57 mg/dL (H)). ?Liver Function Tests: ?Recent Labs  ?Lab 04/01/2022 ?5409 03/26/22 ?0705  ?AST 50* 49*  ?ALT 18 18  ?ALKPHOS 75 62  ?BILITOT 0.6 1.0  ?PROT 5.6* 4.3*  ?ALBUMIN 2.1* 1.5*  ? ?No results for input(s): LIPASE, AMYLASE in the last 168 hours. ?No results for input(s): AMMONIA in the last 168 hours. ?Coagulation Profile: ?Recent Labs  ?Lab 03/21/2022 ?8119  ?INR 1.1  ? ?Cardiac Enzymes: ?Recent Labs  ?Lab 03/11/2022 ?1478 03/26/22 ?0705  ?CKTOTAL 681* 560*  ? ?BNP (last 3 results) ?No results for input(s): PROBNP in the last 8760 hours. ?HbA1C: ?No results for input(s): HGBA1C in the last 72 hours. ?CBG: ?No results for input(s): GLUCAP in the last 168 hours. ?Lipid Profile: ?No results for input(s): CHOL, HDL, LDLCALC, TRIG, CHOLHDL, LDLDIRECT in the last 72 hours. ?Thyroid Function Tests: ?Recent Labs  ?  03/26/22 ?2956  ?TSH 19.017*  ? ?Anemia Panel: ?No results for input(s): VITAMINB12, FOLATE, FERRITIN, TIBC, IRON, RETICCTPCT in the last 72 hours. ?Urine analysis: ?   ?Component Value Date/Time  ? [place] YELLOW 03/30/2022 1847  ? APPEARANCEUR CLOUDY (A) 03/20/2022 1847  ? LABSPEC 1.016 04/03/2022 1847  ? PHURINE 5.0 03/22/2022 1847  ? GLUCOSEU NEGATIVE 03/29/2022 1847  ? HGBUR LARGE (A) 04/07/2022 1847  ? [place] NEGATIVE 03/29/2022 1847  ? [place] NEGATIVE 03/29/2022 1847  ? PROTEINUR 100 (A) 03/09/2022 1847  ? NITRITE  NEGATIVE 03/28/2022 1847  ? LEUKOCYTESUR LARGE (A) 03/24/2022 1847  ? ?Sepsis Labs: ?@[email or web address] (procalcitonin:4,lacticidven:4) ? ?) ?Recent Results (from the past 240 hour(s))  ?Culture, blood (Routine x 2)     Status

## 2022-03-26 NOTE — ED Notes (Signed)
Pt daughter Joellen Jersey [email or web address]@ [phone] request contact for questions/changes/pt status updates while out of pt room. ENMiles ?

## 2022-03-26 NOTE — ED Notes (Signed)
Updated daughter, pt resting more comfortable since meds given. ?

## 2022-03-26 NOTE — Consult Note (Signed)
? ?                                                                                ?Consultation Note ?Date: 03/26/2022  ? ?Patient Name: Randall Valencia  ?DOB: [birth year]  MRN: [id number]  Age / Sex: 61 y.o., male  ?PCP: Patient, No Pcp Per (Inactive) ?Referring Physician: Toy Baker, MD ? ?Reason for Consultation: Establishing goals of care and Terminal Care ? ?HPI/Patient Profile: 61 y.o. 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  Palliative consulted for goals of care. ? ?Clinical Assessment and Goals of Care: ?Randall Valencia is unresponsive and unable to participate in conversation regarding goals of care. ? ?I met today with patient's daughter, Randall Valencia.  We discussed clinical course as well as wishes moving forward.  She expressed understanding of severity of his condition and we talked about the fact that his body is shutting down and with this will likely continue regardless of interventions moving forward.  We discussed differences between an aggressive and a palliative, comfort focused approach to his care.  She reports that his entire family has been seeing his deterioration and she and patient's brother have discussed goal moving forward really should be focused on his comfort and dignity as he approaches end-of-life. ? ?We discussed things would be important to him in 1 thing is waiting for his son to be able to visit.  He was incarcerated but granted early compassionate release and will be coming later today.  I discussed with Randall Valencia a plan to continue with gentle interventions but consideration  for transition to full comfort later this afternoon/evening once family is able to visit.  She understands that current interventions, such as antibiotics, side effects and are not likely to fix his underlying problems and may cause more distress rather than comfort. ? ?I later received secure chat from RN that his son had visited and family had discussed with her desire to transition to comfort and hopefully work to get him home with hospice services. ? ?I called and was able to reach Randall Valencia via phone.  She is currently with her brother and we discussed plan for transition to full comfort care.  We also discussed initial logistics of potential plan to try and get him home with hospice support. ? ?SUMMARY OF RECOMMENDATIONS   ?-DNR/DNI.  Full comfort moving forward. ?-Family understands prognosis at this point is likely days at best.  His daughter reports that he really did not like the hospital and they would like to discuss potential option for home with hospice he is stable enough to do so tomorrow.  We had initial conversation regarding logistics of hospice care today as well as potential options of home with hospice versus residential hospice. ?-Comfort orders and meds placed via comfort care order set. ?Prognosis:  ?Hours - Days ? ?Discharge Planning: Home with Hospice  ? ?  ? ?Primary Diagnoses: ?  Present on Admission: ? Severe protein-calorie malnutrition ([place]) ? Squamous cell carcinoma of lung ([place]) ? Dehydration, severe ? Pressure injury of skin ? Cord compression [facility]) ? AKI (acute kidney injury) ([place]) ? Severe sepsis ([place]) ? UTI (urinary tract infection) ? Cellulitis and abscess of leg ? Anemia associated with chemotherapy ? Thrombocytopenia ([place]) ? Acute metabolic encephalopathy ? Elevated CK ? Functional quadriplegia (HCC) ? COPD (chronic obstructive pulmonary disease) ([place]) ? Acute on chronic respiratory failure with hypoxia (HCC) ? ? ?I have reviewed the medical record, interviewed the patient and  family, and examined the patient. The following aspects are pertinent. ? ?Past Medical History:  ?Diagnosis Date  ? Asthma   ? Cerebral aneurysm   ? Lung cancer ([place])   ? states he also has or has had CA in brain, colon and spinal. states he believes lung is the primary source  ? PUD (peptic ulcer disease)   ? ?Social History  ? ?Socioeconomic History  ? Marital status: Divorced  ?  Spouse name: Not on file  ? Number of children: 5  ? Years of education: Not on file  ? Highest education level: Not on file  ?Occupational History  ? Occupation: disability  ?Tobacco Use  ? Smoking status: Former  ?  Years: 45.00  ?  Types: Cigarettes  ?  Quit date: 12/10/2019  ?  Years since quitting: 2.2  ? Smokeless tobacco: Never  ?Vaping Use  ? Vaping Use: Never used  ?Substance and Sexual Activity  ? Alcohol use: Not Currently  ? Drug use: Not Currently  ? Sexual activity: Not on file  ?Other Topics Concern  ? Not on file  ?Social History Narrative  ? Not on file  ? ?Social Determinants of Health  ? ?Financial Resource Strain: Not on file  ?Food Insecurity: Not on file  ?Transportation Needs: Not on file  ?Physical Activity: Not on file  ?Stress: Not on file  ?Social Connections: Not on file  ? ?Family History  ?Problem Relation Age of Onset  ? Alzheimer's disease Mother   ? Schizophrenia Father   ? Suicidality Father   ? ?Scheduled Meds: ? [START ON 03/27/2022] Chlorhexidine Gluconate Cloth  6 each Topical Q0600  ? ?Continuous Infusions: ? dextrose 5 % and 0.9% NaCl 50 mL/hr at 03/26/22 1435  ? levETIRAcetam 250 mg (03/26/22 2112)  ? methocarbamol (ROBAXIN) IV    ? ?PRN Meds:.acetaminophen **OR** acetaminophen, albuterol, fentaNYL (SUBLIMAZE) injection, glycopyrrolate **OR** glycopyrrolate **OR** glycopyrrolate, haloperidol **OR** haloperidol **OR** haloperidol lactate, lactulose, LORazepam **OR** LORazepam **OR** LORazepam, methocarbamol (ROBAXIN) IV, oxyCODONE ?Medications Prior to Admission:  ?Prior to Admission medications    ?Medication Sig Start Date End Date Taking? Authorizing Provider  ?albuterol (VENTOLIN HFA) 108 (90 Base) MCG/ACT inhaler Inhale 2 puffs into the lungs every 6 (six) hours as needed for wheezing or shortness of breath. 03/18/22  Yes Shelly Coss, MD  ?aspirin 81 MG chewable tablet Chew 81 mg by mouth daily. 03/09/22 03/09/23 Yes [provider]  ?bisacodyl (DULCOLAX) 10 MG suppository Place 10 mg rectally daily. 03/08/22  Yes [provider]  ?Cholecalciferol 1.25 MG (50000 UT) capsule Take 50,000 Units by mouth once a week. 03/15/22 03/15/23 Yes [provider]  ?Cholecalciferol 50 MCG (2000 UT) TABS Take 2,000 Units by mouth daily. 04/19/22  Yes [provider]  ?lactulose (CHRONULAC) 10 GM/15ML solution Take 10-20 g by mouth 3 (three) times daily as needed for constipation. 12/21/21  Yes [provider]  ?levothyroxine (SYNTHROID) 50 MCG  tablet Take 100 mcg by mouth daily before breakfast. 02/13/22  Yes [provider]  ?naloxone (NARCAN) nasal spray 4 mg/0.1 mL Place 1 spray into the nose as needed (opioid reversal). 11/29/21  Yes [provider]  ?omeprazole (PRILOSEC OTC) 20 MG tablet Take 20 mg by mouth daily. 10/04/21 10/04/22 Yes [provider]  ?oxyCODONE (ROXICODONE) 15 MG immediate release tablet Take 15-30 mg by mouth every 4 (four) hours as needed for pain. 01/25/22  Yes [provider]  ?polyethylene glycol powder (GLYCOLAX/MIRALAX) 17 GM/SCOOP powder Take 17 g by mouth daily. 03/19/22  Yes Shelly Coss, MD  ?pregabalin (LYRICA) 50 MG capsule Take 50 mg by mouth in the morning, at noon, and at bedtime. 01/25/22  Yes [provider]  ?prochlorperazine (COMPAZINE) 10 MG tablet Take 10 mg by mouth every 6 (six) hours as needed for nausea/vomiting. 08/02/21  Yes [provider]  ?rivaroxaban (XARELTO) 20 MG TABS tablet Take 1 tablet (20 mg total) by mouth daily with supper. 03/18/22  Yes Shelly Coss, MD   ?senna-docusate (SENOKOT-S) 8.6-50 MG tablet Take 2 tablets by mouth daily. 03/18/22  Yes Shelly Coss, MD  ?SPIRIVA RESPIMAT 1.25 MCG/ACT AERS Inhale 2 puffs into the lungs daily. 03/18/22  Yes Shelly Coss, MD  ?Chauncey Cruel

## 2022-03-26 NOTE — ED Notes (Signed)
Recliner pillow and blanket provided for daughter. ?

## 2022-03-27 DIAGNOSIS — A419 Sepsis, unspecified organism: Secondary | ICD-10-CM | POA: Diagnosis not present

## 2022-03-27 DIAGNOSIS — L0291 Cutaneous abscess, unspecified: Secondary | ICD-10-CM | POA: Diagnosis not present

## 2022-03-27 DIAGNOSIS — Z8589 Personal history of malignant neoplasm of other organs and systems: Secondary | ICD-10-CM | POA: Diagnosis not present

## 2022-03-27 DIAGNOSIS — C3490 Malignant neoplasm of unspecified part of unspecified bronchus or lung: Secondary | ICD-10-CM | POA: Diagnosis not present

## 2022-03-27 DIAGNOSIS — G9341 Metabolic encephalopathy: Secondary | ICD-10-CM | POA: Diagnosis not present

## 2022-03-27 DIAGNOSIS — L03119 Cellulitis of unspecified part of limb: Secondary | ICD-10-CM | POA: Diagnosis not present

## 2022-03-27 DIAGNOSIS — J9621 Acute and chronic respiratory failure with hypoxia: Secondary | ICD-10-CM | POA: Diagnosis not present

## 2022-03-27 MED ORDER — SODIUM CHLORIDE 0.9 % IV SOLN
0.5000 mg/h | INTRAVENOUS | Status: DC
  Administered 2022-03-27 – 2022-03-29 (×2): 0.5 mg/h via INTRAVENOUS
  Filled 2022-03-27 (×3): qty 2.5

## 2022-03-27 MED ORDER — DEXTROSE-NACL 5-0.45 % IV SOLN: INTRAVENOUS | Status: DC

## 2022-03-27 MED ORDER — HYDROMORPHONE BOLUS VIA INFUSION
0.5000 mg | INTRAVENOUS | Status: DC | PRN
  Administered 2022-03-27 – 2022-03-28 (×3): 0.5 mg via INTRAVENOUS
  Filled 2022-03-27: qty 1

## 2022-03-27 NOTE — Progress Notes (Signed)
Nutrition Brief Note ? ?RD consulted for nutritional assessment. ?Chart reviewed. ?Pt now transitioning to comfort care.  ?No nutrition interventions planned at this time.  ? ?Randall Bibles, MS, RD, LDN ?Inpatient Clinical Dietitian ?Contact information available via Amion ?  ?

## 2022-03-27 NOTE — Plan of Care (Signed)
?  Problem: Pain Managment: ?Goal: General experience of comfort will improve ?Outcome: Progressing ?  ?Problem: Safety: ?Goal: Ability to remain free from injury will improve ?03/27/2022 0759 by Tana Conch, RN ?Outcome: Progressing ?03/26/2022 1942 by Tana Conch, RN ?Outcome: Progressing ?  ?Problem: Skin Integrity: ?Goal: Risk for impaired skin integrity will decrease ?Outcome: Progressing ?  ?

## 2022-03-27 NOTE — Progress Notes (Signed)
[facility] ?PROGRESS NOTE ? ? ? ?Progress Note  ?Randall Valencia  PYP:[id number] DOB: 1961/10/21 DOA: 03/30/2022 ?PCP: Patient, No Pcp Per (Inactive)  ? ? ? ?Brief Narrative:  ? ?Randall Valencia is an 61 y.o. 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  Since discharge he is been noncompliant with his medication comes in due to confusion and generalized weakness  ? ? ? ?Assessment/Plan:  ? ?Acute metabolic encephalopathy: ?Possibly due to sepsis, and possibly worsening intracranial bleed from metastases. ?He is requiring 3 L of oxygen to keep saturations greater 97%. ?Hospice and palliative care was consulted, patient is DNR they would like to move to full comfort, the family is debating whether to try to get home with hospice or to residential hospice facility. ? ?Severe sepsis ([place]) due to cellulitis and abscess of the left leg: ?MRSA positive blood cultures positive for E. coli, Streptococcus species and Enterobacter. ?Family decided to move towards comfort measures antibiotics were discontinued appreciate palliative care assistance. ? ?Acute thrombocytopenia due to antineoplastic drugs: ?Noted. ? ?Anemia associated due to antineoplastic drugs: ?Noted. ? 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 ? ?Non-Traumatic rhabdomyolysis: ?IV fluids discontinued as poor moving towards comfort measures. ? ?Severe protein-calorie malnutrition ([place]) ?Noted, prealbumin  6.1. ? ?Hyperglycemia: ?Started on D5 half-normal saline. ? ?Presumptive UTI: ?Noted. ?Functional paraplegia: ?Noted. ? ?Goals of care: ?Wean towards comfort measures. ? ?Unstageable sacral decubitus ulcer present on admission ?RN Pressure Injury Documentation: ?Pressure Injury 03/26/22 Coccyx Mid Unstageable - Full thickness tissue loss in which the base of the injury is covered by slough (yellow, tan, gray, green or brown) and/or eschar (tan, brown or black) in the wound bed. (Active)  ?03/26/22 1615  ?Location: Coccyx  ?Location Orientation: Mid  ?Staging: Unstageable - Full thickness tissue loss in which the base of the injury is covered by slough (yellow, tan, gray, green or brown) and/or eschar (tan, brown or black) in the wound bed.  ?Wound Description (Comments):   ?Present on Admission: Yes  ?Dressing Type Foam - Lift dressing to assess site every shift 03/27/22 0755  ?   ?Pressure Injury 03/26/22 Vertebral column Upper Stage 1 -  Intact skin with non-blanchable redness of a localized area usually over a bony prominence. (Active)  ?03/26/22 1619  ?Location: Vertebral column  ?Location Orientation: Upper  ?Staging: Stage 1 -  Intact skin with non-blanchable redness of a localized area usually over a bony prominence.  ?Wound Description (Comments):   ?Present on Admission: Yes  ?Dressing Type Foam - Lift dressing to assess site every shift 03/27/22 0755  ?   ?Pressure Injury 03/26/22 Vertebral column Right;Upper Stage 1 -  Intact skin with non-blanchable redness of a localized area usually over a bony prominence. (Active)  ?03/26/22 1620  ?Location: Vertebral column  ?Location Orientation: Right;Upper  ?Staging: Stage 1 -  Intact skin with non-blanchable redness of a localized area usually over a bony prominence.  ?Wound Description (Comments):   ?Present on Admission: Yes  ?Dressing Type Foam - Lift dressing to assess site  every shift 03/27/22 0755  ? ? ? ? ? ?DVT prophylaxis: scd ?Family Communication:son  and daughter ?Status is: Inpatient ?Remains inpatient appropriate because: acute metabolic ancephalopathy ? ? ? ?Code Status:  ? ?  ?Code Status Orders  ?(From admission, onward)  ?  ? ? ?  ? ?  Start     Ordered  ? 03/26/22 1643  Do not attempt resuscitation (DNR)  Continuous       ?Question Answer Comment  ?In the event of cardiac or respiratory ARREST Do not call a ?code blue?   ?In the event of cardiac or respiratory ARREST Do not perform Intubation, CPR, defibrillation or ACLS   ?In the event of cardiac or respiratory ARREST Use medication by any route, position, wound care, and other measures to relive pain and suffering. May use oxygen, suction and manual treatment of airway obstruction as needed for comfort.   ?Comments DNR established after goals of care discussion with Daughter Arlyss Repress) at patient's bedside   ?  ? 03/26/22 1643  ? ?  ?  ? ?  ? ?Code Status History   ? ? Date Active Date Inactive Code Status Order ID Comments User Context  ? 03/26/2022 0429 03/26/2022 1643 DNR [id number]  Toy Baker, MD ED  ? 03/17/2022 1705 03/26/2022 0429 DNR [id number]  Valarie Merino, MD ED  ? 03/16/2022 0336 03/18/2022 1716 Full Code [id number]  Rhetta Mura, DO ED  ? 08/20/2021 1719 08/21/2021 1601 Full Code [id number]  Gladys Damme, MD ED  ? 05/11/2019 2111 05/17/2019 1921 Full Code [id number]  OpydIlene Qua, MD ED  ? ?  ? ? ? ? ?IV Access:  ? ?Peripheral IV ? ? ?Procedures and diagnostic studies:  ? ?CT Head Wo Contrast ? ?Result Date: 03/24/2022 ?CLINICAL DATA:  Mental status change, unknown cause EXAM: CT HEAD WITHOUT CONTRAST TECHNIQUE: Contiguous axial images were obtained from the base of the skull through the vertex without intravenous contrast. RADIATION DOSE REDUCTION: This exam was performed according to the departmental dose-optimization program which includes automated exposure control, adjustment of the mA and/or kV according to patient size and/or use of iterative reconstruction technique.  COMPARISON:  08/20/2021. FINDINGS: Brain: Interval increase in the size of a hyperdense, hemorrhagic lesion in the left caudate head, now measuring up to 15 mm, previously 11 mm, with adjacent hypodensity, likely edema. Left posterior frontal lobe lesion, similarly hyperdense, measures up to 10 mm (series 2: Image 25), which was not definitively on the prior study. Additional hyperdense foci in the right parietal lobe(2:22) and left occipital lobe (2:19), which may represent additional lesions. No evidence of acute infarction or midline shift. No hydrocephalus or extra-axial fluid collection. Periventricular white matter changes, likely the sequela of chronic small vessel ischemic disease. Vascular: No hyperdense vessel. Skull: Normal. Negative for fracture or focal lesion. Sinuses/Orbits: No acute finding. Other: The mastoid air cells are well aerated. IMPRESSION: IMPRESSION Interval increase in the size of a hemorrhagic lesion in the left caudate head with adjacent edema, with additional lesions seen in the posterior frontal lobe and possible lesions in the right parietal and left occipital lobes which remain concerning for hemorrhagic metastatic disease. Recommend MRI with and without contrast for further evaluation. Electronically Signed   By: Merilyn Baba M.D.   On: 03/14/2022 17:41  ? ?DG Chest Port 1 View ? ?Result Date: 03/20/2022 ?CLINICAL DATA:  Lethargic, short of breath, history of left lower lobe lung cancer EXAM: PORTABLE CHEST 1 VIEW COMPARISON:  03/15/2022 FINDINGS: Single frontal view of the chest demonstrates a stable cardiac silhouette. Persistent left basilar consolidation consistent with history of lung cancer. No other areas of consolidation. Trace left pleural effusion cannot be excluded. No pneumothorax. Neck spans out T7 metastatic lesion seen on prior MRI is identified, better visualized on previous imaging. No evidence of fracture. IMPRESSION: 1. Continued left lower lobe consolidation  consistent with lung cancer. 2. Destructive T7 lesion consistent with metastatic disease. This is better visualized on recent MRI. 3. No acute airspace disease. Electronically Signed   By: Diana Eves.D

## 2022-03-27 NOTE — Evaluation (Signed)
SLP Cancellation Note ? ?Patient Details ?Name: Trygve Thal ?MRN: [id number] ?DOB: [birth year] ? ? ?Cancelled treatment:       Reason Eval/Treat Not Completed: Other (comment) (order cancelled as pt moving to comfort care; please reorder if desire.) ? ? ?[name] ?03/27/2022, 8:16 AM ? ? ?Kathleen Lime, MS CCC SLP ?Acute Rehab Services ?Office [phone] ?Pager [phone] ? ?

## 2022-03-27 NOTE — Progress Notes (Signed)
? ?                                                                                                                                                     ?                                                   ?Daily Progress Note  ? ?Patient Name: Randall Valencia       Date: 03/27/2022 ?DOB: [birth year]  Age: 61 y.o. MRN#: [id number] ?Attending Physician: Charlynne Cousins, MD ?Primary Care Physician: Patient, No Pcp Per (Inactive) ?Admit Date: 03/10/2022 ? ?Reason for Consultation/Follow-up: Establishing goals of care and Pain control ? ?Subjective: ?I met with patient's son and daughter at the bedside. ? ?We discussed Mr. Hoeffner continued decline.  Discussed that primary goal remains his comfort moving forward.  Family clear that he would not want to be at the hospital if he could be elsewhere and we discussed potential for home hospice vs consideration for residential hospice. ? ?His son is frustrated and reports that he wants his father to be at home, but concern remains about him having 24 hour caregivers available there. ? ?Following discussion, family would like to discuss with [facility] to talk about care level provided at home vs [facility]. ? ?I later followed up with his daughter this evening, and he appears to be quickly declining.  We discussed continued symptom burden and increase in need for pain management.  Discussed consideration for transition to continuous infusion and she would prefer this to ensure his comfort moving forward.  Discussed that he may have decompensated to point that transition form the hospital may not be realistic and she understands.  ? ?Length of Stay: 2 ? ?Current Medications: ?Scheduled Meds:  ? Chlorhexidine Gluconate Cloth  6 each Topical Q0600  ? ? ?Continuous Infusions: ? HYDROmorphone 0.5 mg/hr (03/27/22 1933)  ? levETIRAcetam 250 mg (03/27/22 1114)  ? methocarbamol (ROBAXIN) IV    ? ? ?PRN Meds: ?acetaminophen **OR** acetaminophen, albuterol, glycopyrrolate **OR**  glycopyrrolate **OR** glycopyrrolate, haloperidol **OR** haloperidol **OR** haloperidol lactate, HYDROmorphone, lactulose, LORazepam **OR** LORazepam **OR** LORazepam, methocarbamol (ROBAXIN) IV, oxyCODONE ? ?Physical Exam      ?Gen: Frail appearing, some restlessness ?CV: Tachycardic ?Lungs: Dimninished, coarse ?Ext: Swelling noted    ? ?Vital Signs: BP 109/68 (BP Location: Right Arm)   Pulse (!) 108   Temp 98.2 ?F (36.8 ?C) (Oral)   Resp 16   SpO2 97%  ?SpO2: SpO2: 97 % ?O2 Device: O2 Device: Nasal Cannula ?O2 Flow Rate: O2 Flow Rate (L/min): 3 L/min ? ?Intake/output summary:  ?Intake/Output Summary (Last 24 hours) at 03/27/2022 2150 ?Last data filed at 03/27/2022  1800 ?Gross per 24 hour  ?Intake 132.75 ml  ?Output 425 ml  ?Net -292.25 ml  ? ?LBM:   ?Baseline Weight:   ?Most recent weight:   ? ?     ?Palliative Assessment/Data: ? ? ? ? ? ?Patient Active Problem List  ? Diagnosis Date Noted  ? Severe sepsis ([place]) 04/03/2022  ? UTI (urinary tract infection) 03/14/2022  ? Cellulitis and abscess of leg 03/20/2022  ? Anemia associated with chemotherapy 03/16/2022  ? Thrombocytopenia ([place]) 03/12/2022  ? History of malignant neoplasm metastatic to brain 03/19/2022  ? Acute metabolic encephalopathy 00/92/3300  ? Elevated CK 04/02/2022  ? Functional quadriplegia ([place]) 04/04/2022  ? COPD (chronic obstructive pulmonary disease) ([place]) 03/23/2022  ? Acute on chronic respiratory failure with hypoxia ([place]) 03/23/2022  ? Nontraumatic ischemic infarction of muscle of left lower leg 03/16/2022  ? AKI (acute kidney injury) ([place]) 03/16/2022  ? GERD (gastroesophageal reflux disease) 03/16/2022  ? Acquired hypothyroidism 03/16/2022  ? Pressure injury of skin 03/16/2022  ? Cord compression ([place]) 02/22/2022  ? Hypoalbuminemia   ? Nausea and vomiting   ? Dehydration, severe 08/20/2021  ? Acute hypoxemic respiratory failure ([place]) 08/20/2021  ? Squamous cell carcinoma of lung ([place]) 05/29/2021  ? Bacteremia due to Streptococcus pneumoniae    ? Bronchospasm, acute 05/12/2019  ? Tobacco use 05/12/2019  ? CAP (community acquired pneumonia) 05/11/2019  ? Hyponatremia 05/11/2019  ? Severe protein-calorie malnutrition ([place]) 05/11/2019  ? ? ?Palliative Care Assessment & Plan  ? ?Recommendations/Plan: ?Full comfort moving forward ?Plan for initiation of continuous infusion of dilaudid. ?Discussion today with family about hospice options and referral placed.  During evening f/u, however, he appears to be declining quickly.  Window to transfer from the hospital may have closed.  Discussed with daughter that we will reassess tomorrow. ? ?Goals of Care and Additional Recommendations: ?Limitations on Scope of Treatment: Full Comfort Care ? ?Code Status: ? ?  ?Code Status Orders  ?(From admission, onward)  ?  ? ? ?  ? ?  Start     Ordered  ? 03/26/22 1643  Do not attempt resuscitation (DNR)  Continuous       ?Question Answer Comment  ?In the event of cardiac or respiratory ARREST Do not call a ?code blue?   ?In the event of cardiac or respiratory ARREST Do not perform Intubation, CPR, defibrillation or ACLS   ?In the event of cardiac or respiratory ARREST Use medication by any route, position, wound care, and other measures to relive pain and suffering. May use oxygen, suction and manual treatment of airway obstruction as needed for comfort.   ?Comments DNR established after goals of care discussion with Daughter Arlyss Repress) at patient's bedside   ?  ? 03/26/22 1643  ? ?  ?  ? ?  ? ?Code Status History   ? ? Date Active Date Inactive Code Status Order ID Comments User Context  ? 03/26/2022 0429 03/26/2022 1643 DNR [id number]  Toy Baker, MD ED  ? 03/12/2022 1705 03/26/2022 0429 DNR [id number]  Valarie Merino, MD ED  ? 03/16/2022 0336 03/18/2022 1716 Full Code [id number]  Rhetta Mura, DO ED  ? 08/20/2021 1719 08/21/2021 1601 Full Code [id number]  Gladys Damme, MD ED  ? 05/11/2019 2111 05/17/2019 1921 Full Code [id number]  OpydIlene Qua, MD ED  ? ?   ? ? ?Prognosis: ? Hours - Days ? ?Discharge Planning: ?To Be Determined ? ?Care plan was discussed with daughter, son ? ?Thank you  for allowing the Palliative Medicine Team to assist in the care of this patient. ?Micheline Rough, MD ? ?Please contact Palliative Medicine Team phone at [phone] for questions and concerns.  ? ? ? ? ? ?

## 2022-03-27 NOTE — Progress Notes (Incomplete)
? ?                                                                                                                                                     ?                                                   ?Daily Progress Note  ? ?Patient Name: Randall Valencia       Date: 03/27/2022 ?DOB: 27-Jul-1961  Age: 61 y.o. MRN#: [id number] ?Attending Physician: Charlynne Cousins, MD ?Primary Care Physician: Patient, No Pcp Per (Inactive) ?Admit Date: 03/12/2022 ? ?Reason for Consultation/Follow-up: {Reason for Consult:23484} ? ?Subjective: ?*** ? ?Length of Stay: 2 ? ?Current Medications: ?Scheduled Meds:  ?? Chlorhexidine Gluconate Cloth  6 each Topical Q0600  ? ? ?Continuous Infusions: ?? HYDROmorphone 0.5 mg/hr (03/27/22 1933)  ?? levETIRAcetam 250 mg (03/27/22 1114)  ?? methocarbamol (ROBAXIN) IV    ? ? ?PRN Meds: ?acetaminophen **OR** acetaminophen, albuterol, glycopyrrolate **OR** glycopyrrolate **OR** glycopyrrolate, haloperidol **OR** haloperidol **OR** haloperidol lactate, HYDROmorphone, lactulose, LORazepam **OR** LORazepam **OR** LORazepam, methocarbamol (ROBAXIN) IV, oxyCODONE ? ?Physical Exam         ? ?Vital Signs: BP 109/68 (BP Location: Right Arm)   Pulse (!) 108   Temp 98.2 ?F (36.8 ?C) (Oral)   Resp 16   SpO2 97%  ?SpO2: SpO2: 97 % ?O2 Device: O2 Device: Nasal Cannula ?O2 Flow Rate: O2 Flow Rate (L/min): 3 L/min ? ?Intake/output summary:  ?Intake/Output Summary (Last 24 hours) at 03/27/2022 2130 ?Last data filed at 03/27/2022 1800 ?Gross per 24 hour  ?Intake 132.75 ml  ?Output 425 ml  ?Net -292.25 ml  ? ?LBM:   ?Baseline Weight:   ?Most recent weight:   ? ?     ?Palliative Assessment/Data: ? ? ? ? ? ?Patient Active Problem List  ? Diagnosis Date Noted  ?? Severe sepsis ([place]) 03/28/2022  ?? UTI (urinary tract infection) 03/26/2022  ?? Cellulitis and abscess of leg 03/27/2022  ?? Anemia associated with chemotherapy 03/13/2022  ?? Thrombocytopenia ([place]) 04/05/2022  ?? History of malignant neoplasm metastatic to  brain 03/13/2022  ?? Acute metabolic encephalopathy 06/03/9484  ?? Elevated CK 03/11/2022  ?? Functional quadriplegia ([place]) 03/22/2022  ?? COPD (chronic obstructive pulmonary disease) ([place]) 03/28/2022  ?? Acute on chronic respiratory failure with hypoxia ([place]) 03/13/2022  ?? Nontraumatic ischemic infarction of muscle of left lower leg 03/16/2022  ?? AKI (acute kidney injury) ([place]) 03/16/2022  ?? GERD (gastroesophageal reflux disease) 03/16/2022  ?? Acquired hypothyroidism 03/16/2022  ?? Pressure injury of skin 03/16/2022  ?? Cord compression ([place]) 02/22/2022  ?? Hypoalbuminemia   ?? Nausea and vomiting   ?? Dehydration, severe  08/20/2021  ?? Acute hypoxemic respiratory failure ([place]) 08/20/2021  ?? Squamous cell carcinoma of lung ([place]) 05/29/2021  ?? Bacteremia due to Streptococcus pneumoniae   ?? Bronchospasm, acute 05/12/2019  ?? Tobacco use 05/12/2019  ?? CAP (community acquired pneumonia) 05/11/2019  ?? Hyponatremia 05/11/2019  ?? Severe protein-calorie malnutrition ([place]) 05/11/2019  ? ? ?Palliative Care Assessment & Plan  ? ?Patient Profile: ?*** ? ?Assessment: ?*** ? ?Recommendations/Plan: ?*** ? ?Goals of Care and Additional Recommendations: ?Limitations on Scope of Treatment: {Recommended Scope and Preferences:21019} ? ?Code Status: ? ?  ?Code Status Orders  ?(From admission, onward)  ?  ? ? ?  ? ?  Start     Ordered  ? 03/26/22 1643  Do not attempt resuscitation (DNR)  Continuous       ?Question Answer Comment  ?In the event of cardiac or respiratory ARREST Do not call a ?code blue?   ?In the event of cardiac or respiratory ARREST Do not perform Intubation, CPR, defibrillation or ACLS   ?In the event of cardiac or respiratory ARREST Use medication by any route, position, wound care, and other measures to relive pain and suffering. May use oxygen, suction and manual treatment of airway obstruction as needed for comfort.   ?Comments DNR established after goals of care discussion with Daughter Arlyss Repress) at  patient's bedside   ?  ? 03/26/22 1643  ? ?  ?  ? ?  ? ?Code Status History   ? ? Date Active Date Inactive Code Status Order ID Comments User Context  ? 03/26/2022 0429 03/26/2022 1643 DNR [id number]  Toy Baker, MD ED  ? 03/21/2022 1705 03/26/2022 0429 DNR [id number]  Valarie Merino, MD ED  ? 03/16/2022 0336 03/18/2022 1716 Full Code [id number]  Rhetta Mura, DO ED  ? 08/20/2021 1719 08/21/2021 1601 Full Code [id number]  Gladys Damme, MD ED  ? 05/11/2019 2111 05/17/2019 1921 Full Code [id number]  OpydIlene Qua, MD ED  ? ?  ? ? ?Prognosis: ? {Palliative Care Prognosis:23504} ? ?Discharge Planning: ?{Palliative dispostion:23505} ? ?Care plan was discussed with *** ? ?Thank you for allowing the Palliative Medicine Team to assist in the care of this patient. ? ? ?Time In: *** Time Out: *** Total Time *** Prolonged Time Billed  {YES NO:22349}   ? ?   ?Greater than 50%  of this time was spent counseling and coordinating care related to the above assessment and plan. ? ?Micheline Rough, MD ? ?Please contact Palliative Medicine Team phone at [phone] for questions and concerns.  ? ? ? ? ? ?

## 2022-03-27 NOTE — Plan of Care (Signed)
  Problem: Education: Goal: Knowledge of General Education information will improve Description Including pain rating scale, medication(s)/side effects and non-pharmacologic comfort measures Outcome: Progressing   Problem: Health Behavior/Discharge Planning: Goal: Ability to manage health-related needs will improve Outcome: Progressing   

## 2022-03-28 DIAGNOSIS — Z8589 Personal history of malignant neoplasm of other organs and systems: Secondary | ICD-10-CM | POA: Diagnosis not present

## 2022-03-28 DIAGNOSIS — N179 Acute kidney failure, unspecified: Secondary | ICD-10-CM | POA: Diagnosis not present

## 2022-03-28 DIAGNOSIS — A419 Sepsis, unspecified organism: Secondary | ICD-10-CM | POA: Diagnosis not present

## 2022-03-28 DIAGNOSIS — C3490 Malignant neoplasm of unspecified part of unspecified bronchus or lung: Secondary | ICD-10-CM | POA: Diagnosis not present

## 2022-03-28 DIAGNOSIS — J9621 Acute and chronic respiratory failure with hypoxia: Secondary | ICD-10-CM | POA: Diagnosis not present

## 2022-03-28 DIAGNOSIS — G9341 Metabolic encephalopathy: Secondary | ICD-10-CM | POA: Diagnosis not present

## 2022-03-28 DIAGNOSIS — L03119 Cellulitis of unspecified part of limb: Secondary | ICD-10-CM | POA: Diagnosis not present

## 2022-03-28 LAB — CULTURE, BLOOD (ROUTINE X 2): Special Requests: ADEQUATE

## 2022-03-28 LAB — URINE CULTURE: Culture: >=100000 — AB

## 2022-03-28 NOTE — Progress Notes (Signed)
? ?                                                                                                                                                     ?                                                   ?Daily Progress Note  ? ?Patient Name: Randall Valencia       Date: 03/28/2022 ?DOB: 09/14/1961  Age: 61 y.o. MRN#: [id number] ?Attending Physician: Charlynne Cousins, MD ?Primary Care Physician: Patient, No Pcp Per (Inactive) ?Admit Date: 04/07/2022 ? ?Reason for Consultation/Follow-up: Establishing goals of care and Pain control ? ?Subjective: ?I saw and examined Randall Valencia and discussed with his daughter, Randall Valencia, at the bedside. ? ?She reports that her diet has been more restful since initiation of continuous infusion of Dilaudid.  She was feeling more anxious last night dealing with reality of the fact that we are at the very end of his life, but she reports she is continuing to cope with this as best possible. ? ?Patient's son has been distressed about the fact that for the end of his father's life but is Randall Valencia tells me that he he is working to process it in his own way and coming to terms with things. ? ?Discussed that based upon exam today, I do not think that he appears stable enough to transition to hospice at this point. ? ?Length of Stay: 3 ? ?Current Medications: ?Scheduled Meds:  ? Chlorhexidine Gluconate Cloth  6 each Topical Q0600  ? ? ?Continuous Infusions: ? HYDROmorphone 0.5 mg/hr (03/27/22 1933)  ? levETIRAcetam 250 mg (03/27/22 1114)  ? methocarbamol (ROBAXIN) IV    ? ? ?PRN Meds: ?acetaminophen **OR** acetaminophen, albuterol, glycopyrrolate **OR** glycopyrrolate **OR** glycopyrrolate, haloperidol **OR** haloperidol **OR** haloperidol lactate, HYDROmorphone, lactulose, LORazepam **OR** LORazepam **OR** LORazepam, methocarbamol (ROBAXIN) IV, oxyCODONE ? ?Physical Exam      ?Gen: Frail appearing, no distress ?CV: Tachycardic ?Lungs: Dimninished, coarse ?Ext: Swelling noted    ? ?Vital Signs: BP 109/68  (BP Location: Right Arm)   Pulse (!) 108   Temp 98.2 ?F (36.8 ?C) (Oral)   Resp 16   SpO2 97%  ?SpO2: SpO2: 97 % ?O2 Device: O2 Device: Nasal Cannula ?O2 Flow Rate: O2 Flow Rate (L/min): 3 L/min ? ?Intake/output summary:  ?Intake/Output Summary (Last 24 hours) at 03/28/2022 1016 ?Last data filed at 03/28/2022 0546 ?Gross per 24 hour  ?Intake 143.09 ml  ?Output 825 ml  ?Net -681.91 ml  ? ? ?LBM: Last BM Date : 03/18/22 ?Baseline Weight:   ?Most recent weight:   ? ?     ?Palliative Assessment/Data: ? ? ? ? ? ?  Patient Active Problem List  ? Diagnosis Date Noted  ? Severe sepsis ([place]) 03/09/2022  ? UTI (urinary tract infection) 03/21/2022  ? Cellulitis and abscess of leg 03/23/2022  ? Anemia associated with chemotherapy 03/13/2022  ? Thrombocytopenia ([place]) 03/22/2022  ? History of malignant neoplasm metastatic to brain 03/10/2022  ? Acute metabolic encephalopathy 09/32/3557  ? Elevated CK 03/27/2022  ? Functional quadriplegia ([place]) 03/30/2022  ? COPD (chronic obstructive pulmonary disease) ([place]) 03/09/2022  ? Acute on chronic respiratory failure with hypoxia ([place]) 03/14/2022  ? Nontraumatic ischemic infarction of muscle of left lower leg 03/16/2022  ? AKI (acute kidney injury) ([place]) 03/16/2022  ? GERD (gastroesophageal reflux disease) 03/16/2022  ? Acquired hypothyroidism 03/16/2022  ? Pressure injury of skin 03/16/2022  ? Cord compression ([place]) 02/22/2022  ? Hypoalbuminemia   ? Nausea and vomiting   ? Dehydration, severe 08/20/2021  ? Acute hypoxemic respiratory failure ([place]) 08/20/2021  ? Squamous cell carcinoma of lung ([place]) 05/29/2021  ? Bacteremia due to Streptococcus pneumoniae   ? Bronchospasm, acute 05/12/2019  ? Tobacco use 05/12/2019  ? CAP (community acquired pneumonia) 05/11/2019  ? Hyponatremia 05/11/2019  ? Severe protein-calorie malnutrition ([place]) 05/11/2019  ? ? ?Palliative Care Assessment & Plan  ? ?Recommendations/Plan: ?Full comfort moving forward ?Pain/shortness of breath.  Well-controlled on  current regimen of Dilaudid infusion.  MAR review reveals 1 loading dose into breakthrough doses since initiation of infusion. continue same with plan to uptitrate if needed to ensure his comfort. ?At this point, I do not think that Randall Valencia is stable enough to consider transfer from the hospital and will have a hospital death. ? ?Goals of Care and Additional Recommendations: ?Limitations on Scope of Treatment: Full Comfort Care ? ?Code Status: ? ?  ?Code Status Orders  ?(From admission, onward)  ?  ? ? ?  ? ?  Start     Ordered  ? 03/26/22 1643  Do not attempt resuscitation (DNR)  Continuous       ?Question Answer Comment  ?In the event of cardiac or respiratory ARREST Do not call a ?code blue?   ?In the event of cardiac or respiratory ARREST Do not perform Intubation, CPR, defibrillation or ACLS   ?In the event of cardiac or respiratory ARREST Use medication by any route, position, wound care, and other measures to relive pain and suffering. May use oxygen, suction and manual treatment of airway obstruction as needed for comfort.   ?Comments DNR established after goals of care discussion with Daughter Randall Valencia) at patient's bedside   ?  ? 03/26/22 1643  ? ?  ?  ? ?  ? ?Code Status History   ? ? Date Active Date Inactive Code Status Order ID Comments User Context  ? 03/26/2022 0429 03/26/2022 1643 DNR [id number]  Toy Baker, MD ED  ? 03/16/2022 1705 03/26/2022 0429 DNR [id number]  Valarie Merino, MD ED  ? 03/16/2022 0336 03/18/2022 1716 Full Code [id number]  Rhetta Mura, DO ED  ? 08/20/2021 1719 08/21/2021 1601 Full Code [id number]  Gladys Damme, MD ED  ? 05/11/2019 2111 05/17/2019 1921 Full Code [id number]  OpydIlene Qua, MD ED  ? ?  ? ? ?Prognosis: ? Hours - Days ? ?Discharge Planning: ?Anticipated Hospital Death ? ?Care plan was discussed with daughter ? ?Thank you for allowing the Palliative Medicine Team to assist in the care of this patient. ?Micheline Rough, MD ? ?Please contact Palliative Medicine  Team phone at [phone] for questions and concerns.  ? ? ? ? ? ?

## 2022-03-28 NOTE — Progress Notes (Signed)
[facility] ?PROGRESS NOTE ? ? ? ?Progress Note  ?Randall Valencia  EQA:[id number] DOB: [birth year] DOA: 03/12/2022 ?PCP: Patient, No Pcp Per (Inactive)  ? ? ? ?Brief Narrative:  ? ?Randall Valencia is an 61 y.o. 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  Since discharge he is been noncompliant with his medication comes in due to confusion and generalized weakness  ? ? ? ?Assessment/Plan:  ? ?Acute metabolic encephalopathy: ?Hospice and palliative care was consulted, patient is DNR they would like to move to full comfort. ?Agonal breathing. ? ?Severe sepsis ([place]) due to cellulitis and abscess of the left leg: ?Family decided to move towards comfort measures antibiotics were discontinued appreciate palliative care assistance. ? ?Acute thrombocytopenia due to antineoplastic drugs: ?Noted. ? ?Anemia associated due to antineoplastic drugs: ?Noted. ? 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 ? ?Non-Traumatic rhabdomyolysis: ?IV fluids discontinued as poor moving towards comfort measures. ? ?Severe protein-calorie malnutrition ([place]) ?Noted, prealbumin 6.1. ? ?Hyperglycemia: ?Started on D5 half-normal saline. ? ?Presumptive UTI: ?Noted. ?Functional paraplegia: ?Noted. ? ?Goals of care: ?As he was uncomfortable yesterday and with labored breathing he was started on IV Dilaudid, I explained to the family that he is hours to days.  They are at bedside and comfortable  with decision. ? ?Unstageable sacral decubitus ulcer present on admission ?RN Pressure Injury Documentation: ?Pressure Injury 03/26/22 Coccyx Mid Unstageable - Full thickness tissue loss in which the base of the injury is covered by slough (yellow, tan, gray, green or brown) and/or eschar (tan, brown or black) in the wound bed. (Active)  ?03/26/22 1615  ?Location: Coccyx  ?Location Orientation: Mid  ?Staging: Unstageable - Full thickness tissue loss in which the base of the injury is covered by slough (yellow, tan, gray, green or brown) and/or eschar (tan, brown or black) in the wound bed.  ?Wound Description (Comments):   ?Present on Admission: Yes  ?Dressing Type Foam - Lift dressing to assess site every shift 03/27/22 0755  ?   ?Pressure Injury 03/26/22 Vertebral column Upper Stage 1 -  Intact skin with non-blanchable redness of a localized area usually over a bony prominence. (Active)  ?03/26/22 1619  ?Location: Vertebral column  ?Location Orientation: Upper  ?Staging: Stage 1 -  Intact skin with non-blanchable redness of a localized area usually over a bony prominence.  ?Wound Description (Comments):   ?Present on Admission: Yes  ?Dressing Type Foam - Lift dressing to assess site every shift 03/27/22 0755  ?   ?Pressure Injury 03/26/22 Vertebral column Right;Upper Stage 1 -  Intact skin with non-blanchable redness of a localized area usually over a bony prominence. (Active)  ?03/26/22 1620  ?Location: Vertebral column  ?Location Orientation: Right;Upper  ?Staging: Stage 1 -  Intact skin with non-blanchable redness of a localized area usually over a bony prominence.  ?Wound Description (Comments):   ?Present on Admission: Yes  ?Dressing Type Foam - Lift dressing to assess site every shift 03/27/22 0755  ? ? ? ? ? ?DVT prophylaxis: scd ?Family Communication:son and daughter ?Status is:  Inpatient ?Remains inpatient appropriate because: acute metabolic ancephalopathy ? ? ? ?Code Status:  ? ?  ?Code Status Orders   ?(From admission, onward)  ?  ? ? ?  ? ?  Start     Ordered  ? 03/26/22 1643  Do not attempt resuscitation (DNR)  Continuous       ?Question Answer Comment  ?In the event of cardiac or respiratory ARREST Do not call a ?code blue?   ?In the event of cardiac or respiratory ARREST Do not perform Intubation, CPR, defibrillation or ACLS   ?In the event of cardiac or respiratory ARREST Use medication by any route, position, wound care, and other measures to relive pain and suffering. May use oxygen, suction and manual treatment of airway obstruction as needed for comfort.   ?Comments DNR established after goals of care discussion with Daughter Arlyss Repress) at patient's bedside   ?  ? 03/26/22 1643  ? ?  ?  ? ?  ? ?Code Status History   ? ? Date Active Date Inactive Code Status Order ID Comments User Context  ? 03/26/2022 0429 03/26/2022 1643 DNR [id number]  Toy Baker, MD ED  ? 03/15/2022 1705 03/26/2022 0429 DNR [id number]  Valarie Merino, MD ED  ? 03/16/2022 0336 03/18/2022 1716 Full Code [id number]  Rhetta Mura, DO ED  ? 08/20/2021 1719 08/21/2021 1601 Full Code [id number]  Gladys Damme, MD ED  ? 05/11/2019 2111 05/17/2019 1921 Full Code [id number]  OpydIlene Qua, MD ED  ? ?  ? ? ? ? ?IV Access:  ? ?Peripheral IV ? ? ?Procedures and diagnostic studies:  ? ?No results found. ? ? ?Medical Consultants:  ? ?None. ? ? ?Subjective:  ? ? ?Delmer Islam unresponsive ? ?Objective:  ? ? ?Vitals:  ? 03/26/22 1430 03/26/22 1500 03/26/22 1530 03/26/22 1611  ?BP: 116/70 112/79 113/67 109/68  ?Pulse: (!) 103 (!) 110 (!) 111 (!) 108  ?Resp: 19 (!) '22 20 16  ' ?Temp:   98.5 ?F (36.9 ?C) 98.2 ?F (36.8 ?C)  ?TempSrc:   Oral Oral  ?SpO2: 95% 98% 98% 97%  ? ?SpO2: 97 % ?O2 Flow Rate (L/min): 3 L/min ? ? ?Intake/Output Summary (Last 24 hours) at 03/28/2022 0847 ?Last data filed at 03/28/2022 0546 ?Gross per 24 hour  ?Intake 143.09 ml  ?Output 825 ml  ?Net -681.91 ml  ? ? ?There were no vitals filed for this visit. ? ?Exam: ?General exam:  In no acute distress. ?Respiratory system: Good air movement and clear to auscultation. ?Cardiovascular system: S1 & S2 heard, RRR. No JVD. ?Gastrointestinal system: Abdomen is nondistended, soft and nontender.  ?Extremities: All 4 extremities are swollen  ?Skin: No rashes, lesions or ulcers ? ? ? ?Data Reviewed:  ? ? ?Labs: ?Basic Metabolic Panel: ?Recent Labs  ?Lab 03/20/2022 ?6222 03/26/22 ?0705  ?NA 132* 134*  ?K 4.3 3.7  ?CL 99 103  ?CO2 23 21*  ?GLUCOSE 95 65*  ?BUN 53* 47*  ?CREATININE 1.78* 1.57*  ?CALCIUM 7.8* 7.1*  ?MG 2.3 1.9  ?PHOS 3.7 3.0  ? ? ?GFR ?Estimated Creatinine Clearance: 39.3 mL/min (A) (by C-G formula based on SCr of 1.57 mg/dL (H)). ?Liver Function Tests: ?Recent Labs  ?Lab 03/29/2022 ?9798 03/26/22 ?0705  ?AST 50* 49*  ?ALT 18 18  ?ALKPHOS 75 62  ?BILITOT 0.6 1.0  ?PROT 5.6* 4.3*  ?ALBUMIN 2.1* 1.5*  ? ? ?No results for input(s): LIPASE, AMYLASE in the last 168 hours. ?No results for input(s): AMMONIA in  the last 168 hours. ?Coagulation profile ?Recent Labs  ?Lab 03/28/2022 ?8845  ?INR 1.1  ? ? ?COVID-19 Labs ? ?No results for input(s): DDIMER, FERRITIN, LDH, CRP in the last 72 hours. ? ?Lab Results  ?Component Value Date  ? [place] NEGATIVE 03/26/2022  ? [place] NEGATIVE 02/21/2022  ? [place] NEGATIVE 08/20/2021  ? [place] NEGATIVE 05/11/2019  ? ? ?CBC: ?Recent Labs  ?Lab 03/23/2022 ?7334 03/26/22 ?0705  ?WBC 9.4 8.7  ?NEUTROABS 8.6*  --   ?HGB 10.8* 8.6*  ?HCT 31.9* 25.8*  ?MCV 94.9 95.2  ?PLT 78* 63*  ? ? ?Cardiac Enzymes: ?Recent Labs  ?Lab 03/26/2022 ?4830 03/26/22 ?0705  ?CKTOTAL 681* 560*  ? ? ?BNP (last 3 results) ?No results for input(s): PROBNP in the last 8760 hours. ?CBG: ?Recent Labs  ?Lab 03/26/22 ?1433  ?GLUCAP 62*  ? ? ?D-Dimer: ?No results for input(s): DDIMER in the last 72 hours. ?Hgb A1c: ?No results for input(s): HGBA1C in the last 72 hours. ?Lipid Profile: ?No results for input(s): CHOL, HDL, LDLCALC, TRIG, CHOLHDL, LDLDIRECT in the last 72 hours. ?Thyroid  function studies: ?Recent Labs  ?  03/26/22 ?0705  ?TSH 19.017*  ? ? ?Anemia work up: ?No results for input(s): VITAMINB12, FOLATE, FERRITIN, TIBC, IRON, RETICCTPCT in the last 72 hours. ?Sepsis Labs: ?Recent

## 2022-03-29 DIAGNOSIS — G9341 Metabolic encephalopathy: Secondary | ICD-10-CM | POA: Diagnosis not present

## 2022-03-29 DIAGNOSIS — A419 Sepsis, unspecified organism: Secondary | ICD-10-CM | POA: Diagnosis not present

## 2022-03-29 DIAGNOSIS — L0291 Cutaneous abscess, unspecified: Secondary | ICD-10-CM | POA: Diagnosis not present

## 2022-03-29 DIAGNOSIS — L03119 Cellulitis of unspecified part of limb: Secondary | ICD-10-CM | POA: Diagnosis not present

## 2022-03-29 DIAGNOSIS — C3490 Malignant neoplasm of unspecified part of unspecified bronchus or lung: Secondary | ICD-10-CM | POA: Diagnosis not present

## 2022-03-29 DIAGNOSIS — Z8589 Personal history of malignant neoplasm of other organs and systems: Secondary | ICD-10-CM | POA: Diagnosis not present

## 2022-03-29 DIAGNOSIS — J9621 Acute and chronic respiratory failure with hypoxia: Secondary | ICD-10-CM | POA: Diagnosis not present

## 2022-03-29 NOTE — Progress Notes (Signed)
[facility] ?PROGRESS NOTE ? ? ? ?Progress Note  ?Randall Valencia  LZJ:[id number] DOB: [birth year] DOA: 03/23/2022 ?PCP: Patient, No Pcp Per (Inactive)  ? ? ? ?Brief Narrative:  ? ?Randall Valencia is an 61 y.o. 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  Since discharge he is been noncompliant with his medication comes in due to confusion and generalized weakness  ? ? ? ?Assessment/Plan:  ? ?Acute metabolic encephalopathy: ?Hospice and palliative care was consulted, patient is DNR they would like to move to full comfort. ?Agonal breathing. ? ?Severe sepsis ([place]) due to cellulitis and abscess of the left leg: ?Family decided to move towards comfort measures antibiotics were discontinued appreciate palliative care assistance. ?Continue IV Dilaudid drip, no events overnight. ? ?Acute thrombocytopenia due to antineoplastic drugs: ?Noted. ? ?Anemia associated due to antineoplastic drugs: ?Noted. ? 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 ? ?Non-Traumatic rhabdomyolysis: ?IV fluids discontinued as poor moving towards comfort measures. ? ?Severe protein-calorie malnutrition ([place]) ?Noted, prealbumin 6.1. ? ?Hyperglycemia: ?Started on D5 half-normal saline. ? ?Presumptive UTI: ?Noted. ?Functional paraplegia: ?Noted. ? ?Goals of care: ?On IV Dilaudid drip family at bedside and comfortable with the session. ?No events overnight. ? ?Unstageable sacral decubitus ulcer  present on admission ?RN Pressure Injury Documentation: ?Pressure Injury 03/26/22 Coccyx Mid Unstageable - Full thickness tissue loss in which the base of the injury is covered by slough (yellow, tan, gray, green or brown) and/or eschar (tan, brown or black) in the wound bed. (Active)  ?03/26/22 1615  ?Location: Coccyx  ?Location Orientation: Mid  ?Staging: Unstageable - Full thickness tissue loss in which the base of the injury is covered by slough (yellow, tan, gray, green or brown) and/or eschar (tan, brown or black) in the wound bed.  ?Wound Description (Comments):   ?Present on Admission: Yes  ?Dressing Type Foam - Lift dressing to assess site every shift 03/28/22 0749  ?   ?Pressure Injury 03/26/22 Vertebral column Upper Stage 1 -  Intact skin with non-blanchable redness of a localized area usually over a bony prominence. (Active)  ?03/26/22 1619  ?Location: Vertebral column  ?Location Orientation: Upper  ?Staging: Stage 1 -  Intact skin with non-blanchable redness of a localized area usually over a bony prominence.  ?Wound Description (Comments):   ?Present on Admission: Yes  ?Dressing Type Foam - Lift dressing to assess site every shift 03/28/22 0749  ?   ?Pressure Injury 03/26/22 Vertebral column Right;Upper Stage 1 -  Intact skin with non-blanchable redness of a localized area usually over a bony prominence. (Active)  ?03/26/22 1620  ?Location: Vertebral column  ?Location Orientation: Right;Upper  ?Staging: Stage 1 -  Intact skin with non-blanchable redness of a localized area usually over a bony prominence.  ?Wound Description (Comments):   ?Present on Admission: Yes  ?Dressing Type Foam - Lift dressing to assess site every shift 03/28/22 0749  ? ? ? ? ? ?DVT prophylaxis: scd ?Family Communication:son and daughter ?Status is: Inpatient ?Remains inpatient appropriate because: acute metabolic ancephalopathy ? ? ? ?Code Status:  ? ?  ?  Code Status Orders  ?(From admission, onward)  ?  ? ? ?  ? ?  Start      Ordered  ? 03/26/22 1643  Do not attempt resuscitation (DNR)  Continuous       ?Question Answer Comment  ?In the event of cardiac or respiratory ARREST Do not call a ?code blue?   ?In the event of cardiac or respiratory ARREST Do not perform Intubation, CPR, defibrillation or ACLS   ?In the event of cardiac or respiratory ARREST Use medication by any route, position, wound care, and other measures to relive pain and suffering. May use oxygen, suction and manual treatment of airway obstruction as needed for comfort.   ?Comments DNR established after goals of care discussion with Daughter Arlyss Repress) at patient's bedside   ?  ? 03/26/22 1643  ? ?  ?  ? ?  ? ?Code Status History   ? ? Date Active Date Inactive Code Status Order ID Comments User Context  ? 03/26/2022 0429 03/26/2022 1643 DNR [id number]  Toy Baker, MD ED  ? 04/05/2022 1705 03/26/2022 0429 DNR [id number]  Valarie Merino, MD ED  ? 03/16/2022 0336 03/18/2022 1716 Full Code [id number]  Rhetta Mura, DO ED  ? 08/20/2021 1719 08/21/2021 1601 Full Code [id number]  Gladys Damme, MD ED  ? 05/11/2019 2111 05/17/2019 1921 Full Code [id number]  OpydIlene Qua, MD ED  ? ?  ? ? ? ? ?IV Access:  ? ?Peripheral IV ? ? ?Procedures and diagnostic studies:  ? ?No results found. ? ? ?Medical Consultants:  ? ?None. ? ? ?Subjective:  ? ? ?Ervan Manzano unresponsive. ? ?Objective:  ? ? ?Vitals:  ? 03/26/22 1530 03/26/22 1611 03/28/22 1539 03/28/22 1947  ?BP: 113/67 109/68 (!) 81/47   ?Pulse: (!) 111 (!) 108 (!) 116   ?Resp: '20 16 15   ' ?Temp: 98.5 ?F (36.9 ?C) 98.2 ?F (36.8 ?C)    ?TempSrc: Oral Oral    ?SpO2: 98% 97% (!) 83%   ?Weight:    55 kg  ?Height:    '6\' 2"'  (1.88 m)  ? ?SpO2: (!) 83 % ?O2 Flow Rate (L/min): 3 L/min ? ? ?Intake/Output Summary (Last 24 hours) at 03/29/2022 0833 ?Last data filed at 03/28/2022 2300 ?Gross per 24 hour  ?Intake 12.14 ml  ?Output 750 ml  ?Net -737.86 ml  ? ? ?Filed Weights  ? 03/28/22 1947  ?Weight: 55 kg  ? ? ?Exam: ?General exam: In no  acute distress. ?Respiratory system: Good air movement and clear to auscultation. ?Cardiovascular system: S1 & S2 heard, RRR. No JVD. ?Gastrointestinal system: Abdomen is nondistended, soft and nontender.  ?Extremities: No pedal edema. ?Skin: No rashes, lesions or ulcers ?Psychiatry: Judgement and insight appear normal. Mood & affect appropriate. ? ? ? ?Data Reviewed:  ? ? ?Labs: ?Basic Metabolic Panel: ?Recent Labs  ?Lab 03/18/2022 ?7972 03/26/22 ?0705  ?NA 132* 134*  ?K 4.3 3.7  ?CL 99 103  ?CO2 23 21*  ?GLUCOSE 95 65*  ?BUN 53* 47*  ?CREATININE 1.78* 1.57*  ?CALCIUM 7.8* 7.1*  ?MG 2.3 1.9  ?PHOS 3.7 3.0  ? ? ?GFR ?Estimated Creatinine Clearance: 38 mL/min (A) (by C-G formula based on SCr of 1.57 mg/dL (H)). ?Liver Function Tests: ?Recent Labs  ?Lab 03/20/2022 ?8206 03/26/22 ?0705  ?AST 50* 49*  ?ALT 18 18  ?ALKPHOS 75 62  ?BILITOT 0.6 1.0  ?PROT 5.6* 4.3*  ?ALBUMIN 2.1* 1.5*  ? ? ?No results for input(s): LIPASE, AMYLASE  in the last 168 hours. ?No results for input(s): AMMONIA in the last 168 hours. ?Coagulation profile ?Recent Labs  ?Lab 03/13/2022 ?7672  ?INR 1.1  ? ? ?COVID-19 Labs ? ?No results for input(s): DDIMER, FERRITIN, LDH, CRP in the last 72 hours. ? ?Lab Results  ?Component Value Date  ? [place] NEGATIVE 03/12/2022  ? [place] NEGATIVE 02/21/2022  ? [place] NEGATIVE 08/20/2021  ? [place] NEGATIVE 05/11/2019  ? ? ?CBC: ?Recent Labs  ?Lab 03/16/2022 ?0947 03/26/22 ?0705  ?WBC 9.4 8.7  ?NEUTROABS 8.6*  --   ?HGB 10.8* 8.6*  ?HCT 31.9* 25.8*  ?MCV 94.9 95.2  ?PLT 78* 63*  ? ? ?Cardiac Enzymes: ?Recent Labs  ?Lab 03/24/2022 ?0962 03/26/22 ?0705  ?CKTOTAL 681* 560*  ? ? ?BNP (last 3 results) ?No results for input(s): PROBNP in the last 8760 hours. ?CBG: ?Recent Labs  ?Lab 03/26/22 ?1433  ?GLUCAP 62*  ? ? ?D-Dimer: ?No results for input(s): DDIMER in the last 72 hours. ?Hgb A1c: ?No results for input(s): HGBA1C in the last 72 hours. ?Lipid Profile: ?No results for input(s): CHOL, HDL, LDLCALC, TRIG,  CHOLHDL, LDLDIRECT in the last 72 hours. ?Thyroid function studies: ?No results for input(s): TSH, T4TOTAL, T3FREE, THYROIDAB in the last 72 hours. ? ?Invalid input(s): FREET3 ? ?Anemia work up: ?No results f

## 2022-03-29 NOTE — Progress Notes (Signed)
Peri care provided. Patient premedicated with haldol before turning for comfort. Emotional support provided for daughter. ?

## 2022-03-29 NOTE — Progress Notes (Signed)
Chaplain provided spiritual support to Cathie Olden daughter, at bedside.  Randall Valencia has been here for several days and is caring for her father in the best ways she knows.  Chaplain affirmed the loving care and presence that she is providing.  Chaplain provided listening and grief support as well as prayer, at [facility] request.  Chaplain also encouraged self-care even through this time. ? ?[employer], Bcc ?Pager, [phone] ?

## 2022-03-29 NOTE — Progress Notes (Addendum)
? ?  This pt has been referred to Hospice services for home vs inpatient facility.  ? ?1140am: TC to the daughter, Belenda Cruise, who is at bedside to set up a meeting time for [place] to discuss hospice services and confirm interest/ goals of care in line with hospice services. She is meeting with nurses and reports she will call me back.  ? ?130pm No phone call back from daughter . Reached out again to inquire about [place] meeting. She at this time feels that in the best interest of the pt and his needs is to keep him in hospital for end of life care. We will place the referral on hold. Please call us if the situation changes.  ? ?Selena Lesser RN [phone]  ?

## 2022-03-29 NOTE — TOC Progression Note (Signed)
Transition of Care (TOC) - Progression Note  ? ? ?Patient Details  ?Name: Randall Valencia ?MRN: [id number] ?Date of Birth: September 19, 1961 ? ?Transition of Care (TOC) CM/SW Contact  ?Purcell Mouton, RN ?Phone Number: ?03/29/2022, 1:49 PM ? ?Clinical Narrative:    ? ?Spoke with pt's daughter Belenda Cruise concerning [facility]. Belenda Cruise asked to speak to someone related her no knowing what to do.  ?Referral given to [facility] with [place].  ? ?Expected Discharge Plan: [place] ?Barriers to Discharge: No Barriers Identified ? ?Expected Discharge Plan and Services ?Expected Discharge Plan: [place] ?  ?Discharge Planning Services: CM Consult ?Post Acute Care Choice: Hospice ?Living arrangements for the past 2 months: [place] ?                ?  ?  ?  ?  ?  ?  ?  ?  ?  ?  ? ? ?Social Determinants of Health (SDOH) Interventions ?  ? ?Readmission Risk Interventions ?   ? View : No data to display.  ?  ?  ?  ? ? ?

## 2022-03-29 NOTE — Progress Notes (Signed)
Assumed pt care from Junior RN and agreed on Pt's assessment. Pt resting comfortably on bed.  ?

## 2022-03-29 NOTE — Progress Notes (Signed)
? ?                                                                                                                                                     ?                                                   ?Daily Progress Note  ? ?Patient Name: Randall Valencia       Date: 03/29/2022 ?DOB: [birth year]  Age: 61 y.o. MRN#: [id number] ?Attending Physician: Charlynne Cousins, MD ?Primary Care Physician: Patient, No Pcp Per (Inactive) ?Admit Date: 03/18/2022 ? ?Reason for Consultation/Follow-up: Establishing goals of care and Pain control ? ?Subjective: ?I saw and examined Randall Valencia today.  He was lying in bed in no distress at time of my encounter. ? ?I spoke with his daughter, Joellen Jersey, at the bedside.  We relayed frustration she had with nursing not respecting wish for her father not to be bothered and I discussed that I would see if assignment was able to be changed per her preference. ? ?She then expressed that she thinks she may want to consider trying to take him home with hospice support.  We discussed concern that he may die during transport and she states that if decision is made to take him home she would be comfortable with this as she would know that he died while trying to honor what she thinks would be his wish. ? ?We also discussed continued stress she is under trying to advocate for her father while also processing the fact that he is dying and dealing with other family dynamics. ? ?Length of Stay: 4 ? ?Current Medications: ?Scheduled Meds:  ? Chlorhexidine Gluconate Cloth  6 each Topical Q0600  ? ? ?Continuous Infusions: ? HYDROmorphone 0.5 mg/hr (03/29/22 1355)  ? levETIRAcetam 250 mg (03/27/22 1114)  ? methocarbamol (ROBAXIN) IV    ? ? ?PRN Meds: ?acetaminophen **OR** acetaminophen, albuterol, glycopyrrolate **OR** glycopyrrolate **OR** glycopyrrolate, haloperidol **OR** haloperidol **OR** haloperidol lactate, HYDROmorphone, lactulose, LORazepam **OR** LORazepam **OR** LORazepam, methocarbamol (ROBAXIN) IV,  oxyCODONE ? ?Physical Exam      ?Gen: Frail appearing, no distress ?CV: Tachycardic ?Lungs: Dimninished, coarse ?Ext: Swelling noted    ? ?Vital Signs: BP (!) 88/46   Pulse (!) 124   Temp 98.2 ?F (36.8 ?C) (Oral)   Resp 15   Ht 6\' 2"  (1.88 m)   Wt 55 kg   SpO2 (!) 74%   BMI 15.57 kg/m?  ?SpO2: SpO2: (!) 74 % ?O2 Device: O2 Device: Nasal Cannula ?O2 Flow Rate: O2 Flow Rate (L/min): 3 L/min ? ?Intake/output summary:  ?Intake/Output Summary (Last 24 hours) at 03/29/2022 1534 ?Last data filed  at 03/29/2022 1200 ?Gross per 24 hour  ?Intake 20.99 ml  ?Output 1550 ml  ?Net -1529.01 ml  ? ? ?LBM: Last BM Date : 03/18/22 ?Baseline Weight: Weight: 55 kg ?Most recent weight: Weight: 55 kg ? ?     ?Palliative Assessment/Data: ? ? ? ? ? ?Patient Active Problem List  ? Diagnosis Date Noted  ? Severe sepsis ([place]) 03/14/2022  ? UTI (urinary tract infection) 03/24/2022  ? Cellulitis and abscess of leg 03/24/2022  ? Anemia associated with chemotherapy 03/24/2022  ? Thrombocytopenia ([place]) 03/14/2022  ? History of malignant neoplasm metastatic to brain 03/16/2022  ? Acute metabolic encephalopathy 23/55/7322  ? Elevated CK 03/09/2022  ? Functional quadriplegia ([place]) 03/26/2022  ? COPD (chronic obstructive pulmonary disease) ([place]) 03/18/2022  ? Acute on chronic respiratory failure with hypoxia ([place]) 03/14/2022  ? Nontraumatic ischemic infarction of muscle of left lower leg 03/16/2022  ? AKI (acute kidney injury) ([place]) 03/16/2022  ? GERD (gastroesophageal reflux disease) 03/16/2022  ? Acquired hypothyroidism 03/16/2022  ? Pressure injury of skin 03/16/2022  ? Cord compression ([place]) 02/22/2022  ? Hypoalbuminemia   ? Nausea and vomiting   ? Dehydration, severe 08/20/2021  ? Acute hypoxemic respiratory failure ([place]) 08/20/2021  ? Squamous cell carcinoma of lung ([place]) 05/29/2021  ? Bacteremia due to Streptococcus pneumoniae   ? Bronchospasm, acute 05/12/2019  ? Tobacco use 05/12/2019  ? CAP (community acquired pneumonia) 05/11/2019  ?  Hyponatremia 05/11/2019  ? Severe protein-calorie malnutrition ([place]) 05/11/2019  ? ? ?Palliative Care Assessment & Plan  ? ?Recommendations/Plan: ?Full comfort moving forward ?Pain/shortness of breath.  Well-controlled on current regimen of Dilaudid infusion.  Continue breakthrough dosing as needed and will otherwise continue same with plan to uptitrate if needed to ensure his comfort. ?His daughter is asking about trying to take him home with hospice support.  After discussion, she reports that she would like to speak with representative from [facility]. ? ?Goals of Care and Additional Recommendations: ?Limitations on Scope of Treatment: Full Comfort Care ? ?Code Status: ? ?  ?Code Status Orders  ?(From admission, onward)  ?  ? ? ?  ? ?  Start     Ordered  ? 03/26/22 1643  Do not attempt resuscitation (DNR)  Continuous       ?Question Answer Comment  ?In the event of cardiac or respiratory ARREST Do not call a ?code blue?   ?In the event of cardiac or respiratory ARREST Do not perform Intubation, CPR, defibrillation or ACLS   ?In the event of cardiac or respiratory ARREST Use medication by any route, position, wound care, and other measures to relive pain and suffering. May use oxygen, suction and manual treatment of airway obstruction as needed for comfort.   ?Comments DNR established after goals of care discussion with Daughter Arlyss Repress) at patient's bedside   ?  ? 03/26/22 1643  ? ?  ?  ? ?  ? ?Code Status History   ? ? Date Active Date Inactive Code Status Order ID Comments User Context  ? 03/26/2022 0429 03/26/2022 1643 DNR [id number]  Toy Baker, MD ED  ? 03/11/2022 1705 03/26/2022 0429 DNR [id number]  Valarie Merino, MD ED  ? 03/16/2022 0336 03/18/2022 1716 Full Code [id number]  Rhetta Mura, DO ED  ? 08/20/2021 1719 08/21/2021 1601 Full Code [id number]  Gladys Damme, MD ED  ? 05/11/2019 2111 05/17/2019 1921 Full Code [id number]  OpydIlene Qua, MD ED  ? ?  ? ? ?Prognosis: ?  Hours -  Days ? ?Discharge Planning: ?Anticipated Hospital Death ? ?Care plan was discussed with daughter ? ?Thank you for allowing the Palliative Medicine Team to assist in the care of this patient. ?Micheline Rough, MD ? ?Please contact Palliative Medicine Team phone at [phone] for questions and concerns.  ? ? ? ? ? ?

## 2022-03-30 DIAGNOSIS — L03119 Cellulitis of unspecified part of limb: Secondary | ICD-10-CM | POA: Diagnosis not present

## 2022-03-30 DIAGNOSIS — G9341 Metabolic encephalopathy: Secondary | ICD-10-CM | POA: Diagnosis not present

## 2022-03-30 DIAGNOSIS — A419 Sepsis, unspecified organism: Secondary | ICD-10-CM | POA: Diagnosis not present

## 2022-03-30 DIAGNOSIS — Z8589 Personal history of malignant neoplasm of other organs and systems: Secondary | ICD-10-CM | POA: Diagnosis not present

## 2022-03-30 DIAGNOSIS — N179 Acute kidney failure, unspecified: Secondary | ICD-10-CM | POA: Diagnosis not present

## 2022-03-30 NOTE — Progress Notes (Signed)
Patient's daughter refused housekeeping services. Asks that people to come back later. She is trying to nap. ?

## 2022-03-30 NOTE — Progress Notes (Signed)
Spoke with patient's son Elta Guadeloupe. Elta Guadeloupe reinforces that he wants his dad to come home on hospice. Discussed that patient's last vital sign check supported rapid deterioration. ? ?Explained to Elta Guadeloupe that patient is unlikely to tolerate the journey. He appears to be comfortable without any work of breathing or other signs of pain.  ? ?Significant family stress between son Elta Guadeloupe and daughter Joellen Jersey (at bedside), with verbal altercation at bedside last night. Reinforced we are doing our best to put patient first, and will continue to do so. Encouraged Elta Guadeloupe he can call this RN anytime for updates. ?

## 2022-03-30 NOTE — TOC Progression Note (Signed)
Transition of Care (TOC) - Progression Note  ? ? ?Patient Details  ?Name: Randall Valencia ?MRN: [id number] ?Date of Birth: 1961/05/20 ? ?Transition of Care (TOC) CM/SW Contact  ?Tawanna Cooler, RN ?Phone Number: ?03/30/2022, 9:22 AM ? ?Clinical Narrative:    ? ?Spoke with Fraser Din, hospice rep with [facility].  She will follow up with family and current patient needs to see if inpatient hospice appropriate.   ? ? ?Expected Discharge Plan: [place] ?Barriers to Discharge: No Barriers Identified ? ?Expected Discharge Plan and Services ?Expected Discharge Plan: [place] ?  ?Discharge Planning Services: CM Consult ?Post Acute Care Choice: Hospice ?Living arrangements for the past 2 months: [place] ?                ?  ?  ?  ? ?

## 2022-03-30 NOTE — Progress Notes (Signed)
[facility] ?PROGRESS NOTE ? ? ? ?Progress Note  ?Randall Valencia  IFO:[id number] DOB: Apr 07, 1961 DOA: 03/24/2022 ?PCP: Patient, No Pcp Per (Inactive)  ? ? ? ?Brief Narrative:  ? ?Randall Valencia is an 61 y.o. 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  Since discharge he is been noncompliant with his medication comes in due to confusion and generalized weakness  ? ? ? ?Assessment/Plan:  ? ?Acute metabolic encephalopathy: ?Hospice and palliative care was consulted, patient is DNR they would like to move to full comfort. ?No change overnight. ?Family still waiting to meet with [facility]. ? ?Severe sepsis ([place]) due to cellulitis and abscess of the left leg: ?Family decided to move towards comfort measures antibiotics were discontinued appreciate palliative care assistance. ?Continue IV Dilaudid drip, no events overnight. ? ?Acute thrombocytopenia due to antineoplastic drugs: ?Noted. ? ?Anemia associated due to antineoplastic drugs: ?Noted. ? 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 ? ?Non-Traumatic rhabdomyolysis: ?IV fluids discontinued as poor moving towards comfort measures. ? ?Severe protein-calorie malnutrition ([place]) ?Noted, prealbumin 6.1. ? ?Hyperglycemia: ?Started on D5 half-normal saline. ? ?Presumptive UTI: ?Noted. ?Functional paraplegia: ?Noted. ? ?Goals of care: ?On IV Dilaudid drip family at bedside and comfortable with the  session. ?No events overnight. ? ?Unstageable sacral decubitus ulcer present on admission ?RN Pressure Injury Documentation: ?Pressure Injury 03/26/22 Coccyx Mid Unstageable - Full thickness tissue loss in which the base of the injury is covered by slough (yellow, tan, gray, green or brown) and/or eschar (tan, brown or black) in the wound bed. (Active)  ?03/26/22 1615  ?Location: Coccyx  ?Location Orientation: Mid  ?Staging: Unstageable - Full thickness tissue loss in which the base of the injury is covered by slough (yellow, tan, gray, green or brown) and/or eschar (tan, brown or black) in the wound bed.  ?Wound Description (Comments):   ?Present on Admission: Yes  ?Dressing Type Foam - Lift dressing to assess site every shift 03/29/22 2000  ?   ?Pressure Injury 03/26/22 Vertebral column Upper Stage 1 -  Intact skin with non-blanchable redness of a localized area usually over a bony prominence. (Active)  ?03/26/22 1619  ?Location: Vertebral column  ?Location Orientation: Upper  ?Staging: Stage 1 -  Intact skin with non-blanchable redness of a localized area usually over a bony prominence.  ?Wound Description (Comments):   ?Present on Admission: Yes  ?Dressing Type Foam - Lift dressing to assess site every shift 03/29/22 0900  ?   ?Pressure Injury 03/26/22 Vertebral column Right;Upper Stage 1 -  Intact skin with non-blanchable redness of a localized area usually over a bony prominence. (Active)  ?03/26/22 1620  ?Location: Vertebral column  ?Location Orientation: Right;Upper  ?Staging: Stage 1 -  Intact skin with non-blanchable redness of a localized area usually over a bony prominence.  ?Wound Description (Comments):   ?Present on Admission: Yes  ?Dressing Type Foam - Lift dressing to assess site every shift 03/29/22 0900  ? ? ? ? ? ?DVT prophylaxis: scd ?Family Communication:son and daughter ?Status is: Inpatient ?Remains inpatient  appropriate because: acute metabolic ancephalopathy ? ? ? ?Code Status:  ? ?  ?Code  Status Orders  ?(From admission, onward)  ?  ? ? ?  ? ?  Start     Ordered  ? 03/26/22 1643  Do not attempt resuscitation (DNR)  Continuous       ?Question Answer Comment  ?In the event of cardiac or respiratory ARREST Do not call a ?code blue?   ?In the event of cardiac or respiratory ARREST Do not perform Intubation, CPR, defibrillation or ACLS   ?In the event of cardiac or respiratory ARREST Use medication by any route, position, wound care, and other measures to relive pain and suffering. May use oxygen, suction and manual treatment of airway obstruction as needed for comfort.   ?Comments DNR established after goals of care discussion with Daughter Arlyss Repress) at patient's bedside   ?  ? 03/26/22 1643  ? ?  ?  ? ?  ? ?Code Status History   ? ? Date Active Date Inactive Code Status Order ID Comments User Context  ? 03/26/2022 0429 03/26/2022 1643 DNR [id number]  Toy Baker, MD ED  ? 04/03/2022 1705 03/26/2022 0429 DNR [id number]  Valarie Merino, MD ED  ? 03/16/2022 0336 03/18/2022 1716 Full Code [id number]  Rhetta Mura, DO ED  ? 08/20/2021 1719 08/21/2021 1601 Full Code [id number]  Gladys Damme, MD ED  ? 05/11/2019 2111 05/17/2019 1921 Full Code [id number]  OpydIlene Qua, MD ED  ? ?  ? ? ? ? ?IV Access:  ? ?Peripheral IV ? ? ?Procedures and diagnostic studies:  ? ?No results found. ? ? ?Medical Consultants:  ? ?None. ? ? ?Subjective:  ? ? ?Randall Valencia unresponsive. ? ?Objective:  ? ? ?Vitals:  ? 03/28/22 1539 03/28/22 1947 03/29/22 1204 03/29/22 2000  ?BP: (!) 81/47  (!) 88/46   ?Pulse: (!) 116  (!) 124 (!) 124  ?Resp: 15     ?Temp:      ?TempSrc:      ?SpO2: (!) 83%  (!) 74% (!) 77%  ?Weight:  55 kg    ?Height:  _0  (1.88 m)    ? ?SpO2: (!) 77 % ?O2 Flow Rate (L/min): 3 L/min ? ? ?Intake/Output Summary (Last 24 hours) at 03/30/2022 0845 ?Last data filed at 03/30/2022 0500 ?Gross per 24 hour  ?Intake 37.96 ml  ?Output 1200 ml  ?Net -1162.04 ml  ? ? ?Filed Weights  ? 03/28/22 1947  ?Weight: 55 kg   ? ? ?Exam: ?General exam: In no acute distress. ?Respiratory system: Good air movement and clear to auscultation. ?Cardiovascular system: S1 & S2 heard, RRR. No JVD. ?Gastrointestinal system: Abdomen is nondistended, soft and nontender.  ?Extremities: No pedal edema. ?Skin: No rashes, lesions or ulcers ?Psychiatry: Judgement and insight appear normal. Mood & affect appropriate. ? ? ? ?Data Reviewed:  ? ? ?Labs: ?Basic Metabolic Panel: ?Recent Labs  ?Lab 03/13/2022 ?4742 03/26/22 ?0705  ?NA 132* 134*  ?K 4.3 3.7  ?CL 99 103  ?CO2 23 21*  ?GLUCOSE 95 65*  ?BUN 53* 47*  ?CREATININE 1.78* 1.57*  ?CALCIUM 7.8* 7.1*  ?MG 2.3 1.9  ?PHOS 3.7 3.0  ? ? ?GFR ?Estimated Creatinine Clearance: 38 mL/min (A) (by C-G formula based on SCr of 1.57 mg/dL (H)). ?Liver Function Tests: ?Recent Labs  ?Lab 04/07/2022 ?5956 03/26/22 ?0705  ?AST 50* 49*  ?ALT 18 18  ?ALKPHOS 75 62  ?BILITOT 0.6 1.0  ?PROT 5.6* 4.3*  ?ALBUMIN  2.1* 1.5*  ? ? ?No results for input(s): LIPASE, AMYLASE in the last 168 hours. ?No results for input(s): AMMONIA in the last 168 hours. ?Coagulation profile ?Recent Labs  ?Lab 03/14/2022 ?8472  ?INR 1.1  ? ? ?COVID-19 Labs ? ?No results for input(s): DDIMER, FERRITIN, LDH, CRP in the last 72 hours. ? ?Lab Results  ?Component Value Date  ? [place] NEGATIVE 03/13/2022  ? [place] NEGATIVE 02/21/2022  ? [place] NEGATIVE 08/20/2021  ? [place] NEGATIVE 05/11/2019  ? ? ?CBC: ?Recent Labs  ?Lab 03/29/2022 ?0721 03/26/22 ?0705  ?WBC 9.4 8.7  ?NEUTROABS 8.6*  --   ?HGB 10.8* 8.6*  ?HCT 31.9* 25.8*  ?MCV 94.9 95.2  ?PLT 78* 63*  ? ? ?Cardiac Enzymes: ?Recent Labs  ?Lab 04/02/2022 ?8288 03/26/22 ?0705  ?CKTOTAL 681* 560*  ? ? ?BNP (last 3 results) ?No results for input(s): PROBNP in the last 8760 hours. ?CBG: ?Recent Labs  ?Lab 03/26/22 ?1433  ?GLUCAP 62*  ? ? ?D-Dimer: ?No results for input(s): DDIMER in the last 72 hours. ?Hgb A1c: ?No results for input(s): HGBA1C in the last 72 hours. ?Lipid Profile: ?No results for  input(s): CHOL, HDL, LDLCALC, TRIG, CHOLHDL, LDLDIRECT in the last 72 hours. ?Thyroid function studies: ?No results for input(s): TSH, T4TOTAL, T3FREE, THYROIDAB in the last 72 hours. ? ?Invalid input(s): FREET3 ? ?Anemia wo

## 2022-03-31 DIAGNOSIS — N179 Acute kidney failure, unspecified: Secondary | ICD-10-CM | POA: Diagnosis not present

## 2022-03-31 DIAGNOSIS — J9621 Acute and chronic respiratory failure with hypoxia: Secondary | ICD-10-CM | POA: Diagnosis not present

## 2022-03-31 DIAGNOSIS — G9341 Metabolic encephalopathy: Secondary | ICD-10-CM | POA: Diagnosis not present

## 2022-03-31 DIAGNOSIS — A419 Sepsis, unspecified organism: Secondary | ICD-10-CM | POA: Diagnosis not present

## 2022-04-08 NOTE — Progress Notes (Signed)
? ?                                                                                                                                                     ?                                                   ?Daily Progress Note  ? ?Patient Name: Randall Valencia       Date: 04/30/22 ?DOB: [birth year]  Age: 61 y.o. MRN#: [id number] ?Attending Physician: No att. providers found ?Primary Care Physician: Patient, No Pcp Per (Inactive) ?Admit Date: 03/27/2022 ? ?Reason for Consultation/Follow-up: Establishing goals of care and Pain control ? ?Subjective: ?I saw and examined Randall Valencia today.  He is actively dying.  No distress. ? ?Daughters Herbalist were at the bedside.  Discussed symptoms and they report that he has been comfortable. ? ?Katie asked me about potential for her brother to be able to come and visit again (he was escorted off property previously).  I told her that I would speak with staff regarding possibility of him coming to visit again (I am not sure exactly what all occurred leading to his being escorted out of hospital).  Discussed with her hospital policies regarding staff safety. ? ?Physical Exam      ?Gen: Frail appearing, no distress ?CV: Tachycardic ?Lungs: Dimninished, coarse ?Ext: Swelling noted    ? ?Vital Signs: BP (!) 88/46   Pulse (!) 112   Temp 98.2 ?F (36.8 ?C) (Oral)   Resp (!) 30   Ht 6\' 2"  (1.88 m)   Wt 55 kg   SpO2 (!) 78%   BMI 15.57 kg/m?  ?SpO2: SpO2: (!) 78 % ?O2 Device: O2 Device: Nasal Cannula ?O2 Flow Rate: O2 Flow Rate (L/min): 2 L/min ? ?Intake/output summary:  ?Intake/Output Summary (Last 24 hours) at Apr 30, 2022 1034 ?Last data filed at 03/30/2022 1500 ?Gross per 24 hour  ?Intake 10 ml  ?Output --  ?Net 10 ml  ? ? ?LBM: Last BM Date : 03/18/22 ?Baseline Weight: Weight: 55 kg ?Most recent weight: Weight: 55 kg ? ?     ?Palliative Assessment/Data: ? ? ? ? ? ?Patient Active Problem List  ? Diagnosis Date Noted  ? Severe sepsis ([place]) 03/13/2022  ? UTI (urinary tract  infection) 04/05/2022  ? Cellulitis and abscess of leg 03/22/2022  ? Anemia associated with chemotherapy 04/01/2022  ? Thrombocytopenia ([place]) 03/10/2022  ? History of malignant neoplasm metastatic to brain 03/19/2022  ? Acute metabolic encephalopathy 03/00/9233  ? Elevated CK 03/11/2022  ? Functional quadriplegia ([place]) 03/11/2022  ? COPD (chronic obstructive pulmonary disease) ([place]) 03/24/2022  ? Acute on chronic respiratory failure  with hypoxia ([place]) 03/22/2022  ? Nontraumatic ischemic infarction of muscle of left lower leg 03/16/2022  ? AKI (acute kidney injury) ([place]) 03/16/2022  ? GERD (gastroesophageal reflux disease) 03/16/2022  ? Acquired hypothyroidism 03/16/2022  ? Pressure injury of skin 03/16/2022  ? Cord compression ([place]) 02/22/2022  ? Hypoalbuminemia   ? Nausea and vomiting   ? Dehydration, severe 08/20/2021  ? Acute hypoxemic respiratory failure ([place]) 08/20/2021  ? Squamous cell carcinoma of lung ([place]) 05/29/2021  ? Bacteremia due to Streptococcus pneumoniae   ? Bronchospasm, acute 05/12/2019  ? Tobacco use 05/12/2019  ? CAP (community acquired pneumonia) 05/11/2019  ? Hyponatremia 05/11/2019  ? Severe protein-calorie malnutrition ([place]) 05/11/2019  ? ? ?Palliative Care Assessment & Plan  ? ?Recommendations/Plan: ?Full comfort moving forward ?Pain/shortness of breath.  Well-controlled on current regimen of Dilaudid infusion.  Continue breakthrough dosing as needed and will otherwise continue same with plan to uptitrate if needed to ensure his comfort. ?Anticipate hospital death.  We will continue to support family as best possible. ? ?Goals of Care and Additional Recommendations: ?Limitations on Scope of Treatment: Full Comfort Care ? ?Code Status: ? ?  ?Code Status Orders  ?(From admission, onward)  ?  ? ? ?  ? ?  Start     Ordered  ? 03/26/22 1643  Do not attempt resuscitation (DNR)  Continuous       ?Question Answer Comment  ?In the event of cardiac or respiratory ARREST Do not call a ?code blue?    ?In the event of cardiac or respiratory ARREST Do not perform Intubation, CPR, defibrillation or ACLS   ?In the event of cardiac or respiratory ARREST Use medication by any route, position, wound care, and other measures to relive pain and suffering. May use oxygen, suction and manual treatment of airway obstruction as needed for comfort.   ?Comments DNR established after goals of care discussion with Daughter Arlyss Repress) at patient's bedside   ?  ? 03/26/22 1643  ? ?  ?  ? ?  ? ?Code Status History   ? ? Date Active Date Inactive Code Status Order ID Comments User Context  ? 03/26/2022 0429 03/26/2022 1643 DNR [id number]  Toy Baker, MD ED  ? 03/18/2022 1705 03/26/2022 0429 DNR [id number]  Valarie Merino, MD ED  ? 03/16/2022 0336 03/18/2022 1716 Full Code [id number]  Rhetta Mura, DO ED  ? 08/20/2021 1719 08/21/2021 1601 Full Code [id number]  Gladys Damme, MD ED  ? 05/11/2019 2111 05/17/2019 1921 Full Code [id number]  OpydIlene Qua, MD ED  ? ?  ? ? ?Prognosis: ? Hours - Days ? ?Discharge Planning: ?Anticipated Hospital Death ? ?Care plan was discussed with daughter ? ?Thank you for allowing the Palliative Medicine Team to assist in the care of this patient. ?Micheline Rough, MD ? ?Please contact Palliative Medicine Team phone at [phone] for questions and concerns.  ? ? ? ? ? ?

## 2022-04-08 NOTE — Progress Notes (Signed)
Received a call from lead security officer that patients son Elta Guadeloupe was asking to be escorted up to patients room.  Elta Guadeloupe was escorted out by security on 4/21 after a verbal altercation occurred between him and his sibling.  After speaking with mark in ED lobby, he became belligerent and threatening.  [place] police and security was able to escort him off of hospital property.    ?

## 2022-04-08 NOTE — Discharge Summary (Signed)
?Death Summary  ?Randall Valencia SHF:[id number] DOB: [birth year] DOA: 04/07/2022 ? ?PCP: Patient, No Pcp Per (Inactive) ? ?Admit date: 04/07/2022 ?Date of Death: 2022/04/15 ?Time of Death: 12:15 ?Notification: Patient, No Pcp Per (Inactive) notified of death of 2022-04-15 ? ? ?History of present illness:  ?Randall Valencia is a 61 y.o. 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 ?Final Diagnoses:  ?Acute metabolic encephalopathy likely due to severe sepsis in the setting of cellulitis of the leg and abscess: ?He was started on antibiotics and fluid resuscitated. ?He was discussed with the family his poor prognosis hospice and Perative care met with family and the family decided to move towards comfort care. ?All medications were stopped and he was started on IV Dilaudid drip and he passed away in the hospital. ? 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 ? ?The results of significant diagnostics from this hospitalization (including imaging, microbiology, ancillary and laboratory) are listed below for reference.   ? ?Significant Diagnostic Studies: ?CT Head Wo Contrast ? ?Result Date: 04/07/2022 ?CLINICAL DATA:  Mental status change, unknown cause EXAM: CT HEAD WITHOUT CONTRAST TECHNIQUE: Contiguous axial images were obtained from the base of the skull through the vertex without intravenous contrast. RADIATION DOSE REDUCTION: This exam was performed  according to the departmental dose-optimization program which includes automated exposure control, adjustment of the mA and/or kV according to patient size and/or use of iterative reconstruction technique. COMPARISON:  08/20/2021. FINDINGS: Brain: Interval increase in the size of a hyperdense, hemorrhagic lesion in the left caudate head, now measuring up to 15 mm, previously 11 mm, with adjacent hypodensity, likely edema. Left posterior frontal lobe lesion, similarly hyperdense, measures up to 10 mm (series 2: Image 25), which was not definitively on the prior study. Additional hyperdense foci in the right parietal lobe(2:22) and left occipital lobe (2:19), which may represent additional lesions. No evidence of acute infarction or midline shift. No hydrocephalus or extra-axial fluid collection. Periventricular white matter changes, likely the sequela of chronic small vessel ischemic disease. Vascular: No hyperdense vessel. Skull: Normal. Negative for fracture or focal lesion. Sinuses/Orbits: No acute finding. Other: The mastoid air cells are well aerated. IMPRESSION: IMPRESSION Interval increase in the size of a hemorrhagic lesion in the left caudate head with adjacent edema, with additional lesions seen in the posterior frontal lobe and possible lesions in the right parietal and left occipital lobes which remain concerning for hemorrhagic metastatic disease. Recommend MRI with and without contrast for further evaluation. Electronically Signed   By: Merilyn Baba M.D.   On: 04-07-2022 17:41  ? ?CT ANGIO AO+BIFEM W & OR WO CONTRAST ? ?Result Date: 03/16/2022 ?CLINICAL DATA:  Abdominal distension and decreased peripheral pulses EXAM: CT ANGIOGRAPHY OF ABDOMINAL AORTA WITH ILIOFEMORAL RUNOFF TECHNIQUE: Multidetector CT imaging of the abdomen, pelvis and lower extremities was performed using the standard protocol during bolus administration of intravenous contrast. Multiplanar CT image reconstructions and MIPs were  obtained to evaluate the vascular anatomy. RADIATION DOSE REDUCTION: This exam was performed according to the departmental dose-optimization program which includes automated exposure control, adjustment  of the mA and/or kV according to patient size and/or use of iterative reconstruction technique. CONTRAST:  140m OMNIPAQUE IOHEXOL 350 MG/ML SOLN COMPARISON:  Similar exam from 02/21/2022 FINDINGS: VASCULAR Aorta: Abdominal aorta demonstrates minimal atherosclerotic change without aneurysmal dilatation or dissection. Celiac: Patent without evidence of aneurysm, dissection, vasculitis or significant stenosis. SMA: Patent without evidence of aneurysm, dissection, vasculitis or significant stenosis. Renals: Dual renal arteries on the right with single renal artery on the left. No focal stenosis is noted. IMA: Patent without evidence of aneurysm, dissection, vasculitis or significant stenosis. RIGHT Lower Extremity Inflow: Iliacs demonstrate mild atherosclerotic calcification without aneurysmal dilatation or stenosis. Runoff: Common femoral artery demonstrates atherosclerotic calcifications without focal stenosis. Superficial femoral artery and popliteal artery are well visualized. The popliteal trifurcation is widely patent with three-vessel runoff to the right ankle. The posterior tibial artery is the dominant runoff vessel into the foot. LEFT Lower Extremity Inflow: Iliacs demonstrate atherosclerotic calcifications without aneurysmal dilatation. Runoff: Common femoral artery demonstrates atherosclerotic calcification. No focal stenosis is noted. Superficial femoral artery is well visualized proximally but of lesser density than that seen on the right. Filling defects are noted within the distal aspect of the superficial femoral artery likely related to emboli. The popliteal artery is well visualized although the popliteal trifurcation and infrapopliteal vessels are not well seen on delayed images consistent with the  poor inflow from the changes in the superficial femoral artery. Veins: No specific venous abnormality is noted. Review of the MIP images confirms the above findings. NON-VASCULAR Lower chest: Persistent consolidation in the left lower lobe is noted with associated small effusion. Patchy infiltrate in the medial aspect of the right base is noted as well. Hepatobiliary: Liver again demonstrates hypodensity in the dome of the liver likely representing a small cyst. No other focal abnormality is seen. The gallbladder is within normal limits. Pancreas: Stable mass lesion is noted in the midportion of the pancreas measuring up to 3.6 cm similar to that seen on the prior exam. The more peripheral pancreas is atrophic. Spleen: Normal in size without focal abnormality. Adrenals/Urinary Tract: Left adrenal gland is within normal limits. Right adrenal gland again demonstrates a focal 2 cm lesion similar to that seen on the prior exam again suspicious for metastatic disease. Kidneys demonstrate a normal enhancement pattern. No obstructive changes are seen. The bladder is over distended. Stomach/Bowel: Scattered fecal material is noted throughout the colon without obstructive change. The appendix is not well visualized. Stomach and small bowel are within normal limits. Lymphatic: No definitive lymphadenopathy is noted although a paucity of intra-abdominal fat limits evaluation. Reproductive: Prostate is unremarkable. Other: No abdominal wall hernia or abnormality. No abdominopelvic ascites. Musculoskeletal: Lytic lesion centered in the T12 right pedicle is noted similar to that seen on prior MRI. No other definitive bony lesion is seen. IMPRESSION: VASCULAR Normal-appearing runoff on the right. Filling defects within the distal superficial femoral artery on the left as well as at the level of the popliteal trifurcation consistent with emboli. Distal runoff on the left is not visualized due to these changes. NON-VASCULAR  Persistent consolidation in the left base likely related to the patient's known history of lung carcinoma. Enhancing mass lesion in the midportion of the pancreas again highly suspicious for pancreatic neoplasm. Ag

## 2022-04-08 NOTE — Progress Notes (Signed)
Went to pt's room at 1215 to assess pt's present status. Noticed pt not breathing. Listened for Heart sound for 1 minute. No heart sound noted. Ralene Muskrat, RN to verify pt's status. Notify Agricultural consultant and the daughter. The daughter said she's not able to come by and undecided about funeral home.  ? ?Post-mortem care complete. Pt transferred down to morgue ?

## 2022-04-08 DEATH — deceased

## 2022-10-28 IMAGING — CT CT HEAD W/O CM
3 series · 14 of 47 positions shown, 16 images · non-contrast
Comparison: 08/20/2021.

CLINICAL DATA: Mental status change, unknown cause



[Series 2: head wo · axial · 0.47mm/px · z∈[-149,-24]mm · 8 of 31 slices shown, 10 images]
[im 3/31  brain]
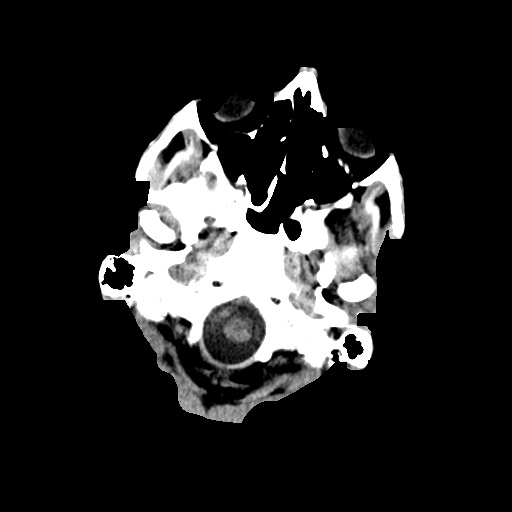
[im 3/31  bone]
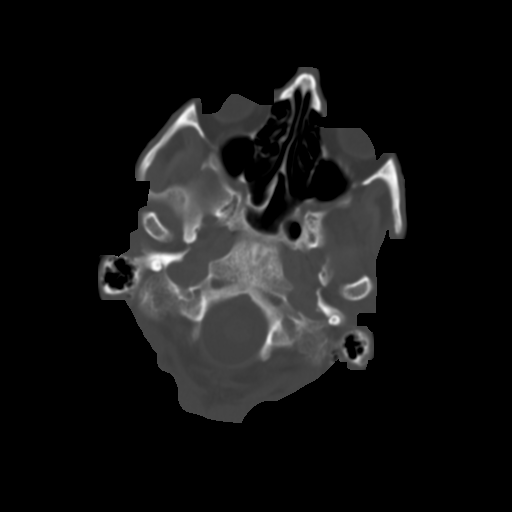
[im 7/31  brain]
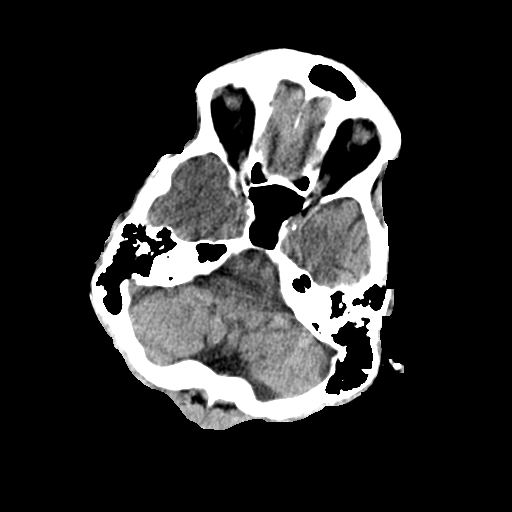
[im 10/31  brain]
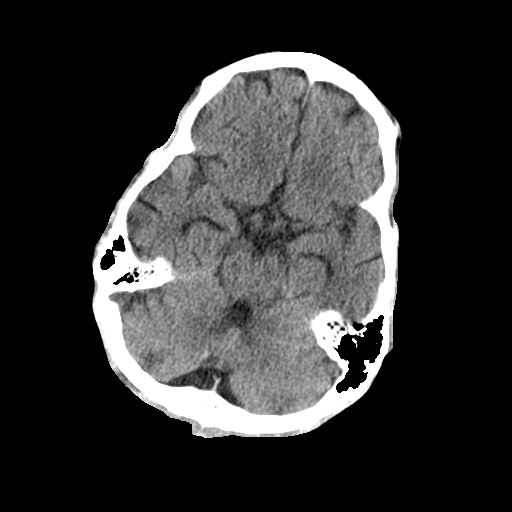
[im 14/31  brain]
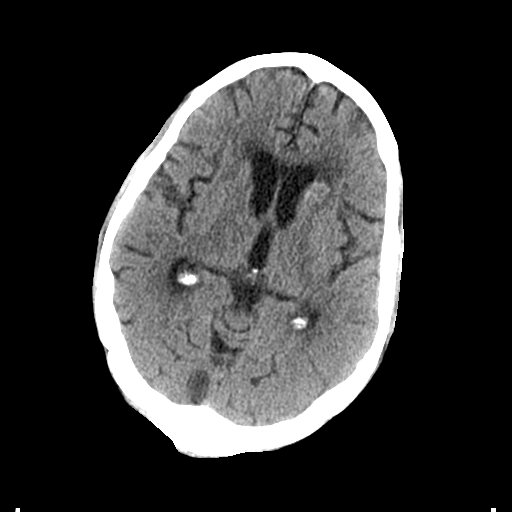
[im 17/31  brain]
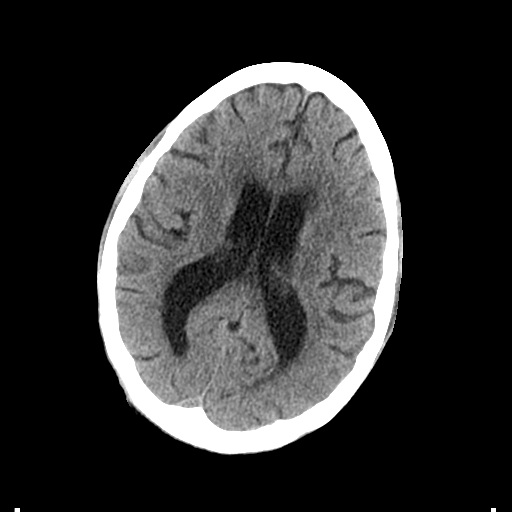
[im 17/31  bone]
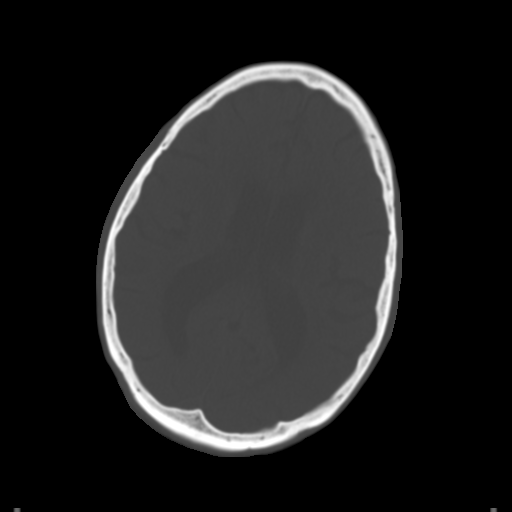
[im 21/31  brain]
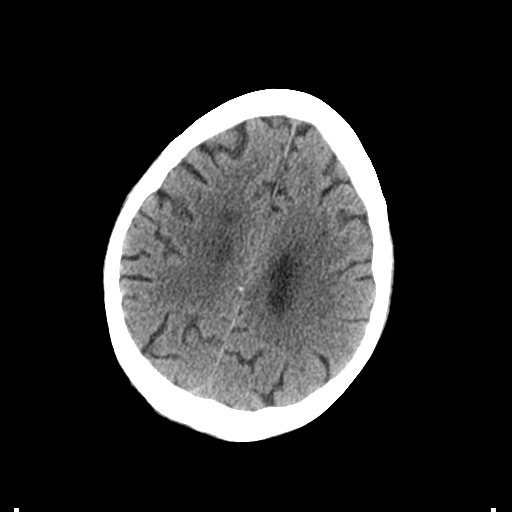
[im 24/31  brain]
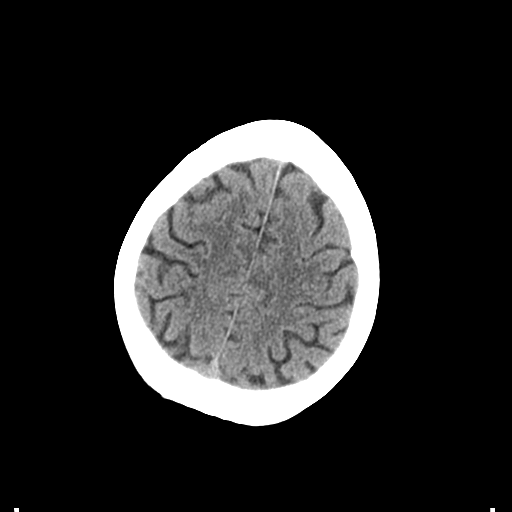
[im 28/31  brain]
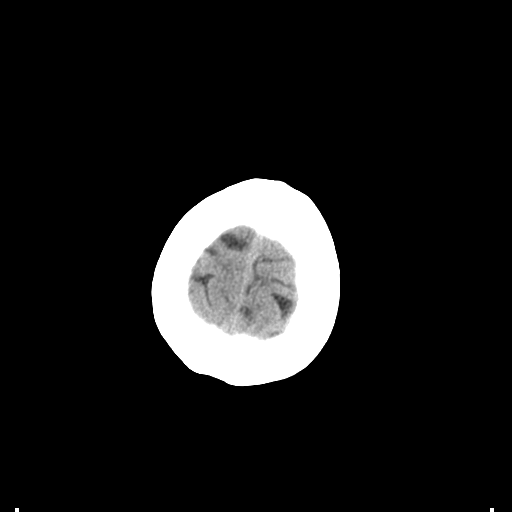

[Series 4: coronal soft tissue · coronal · 0.32mm/px · 3 of 68 slices shown]
[im 23/68  brain]
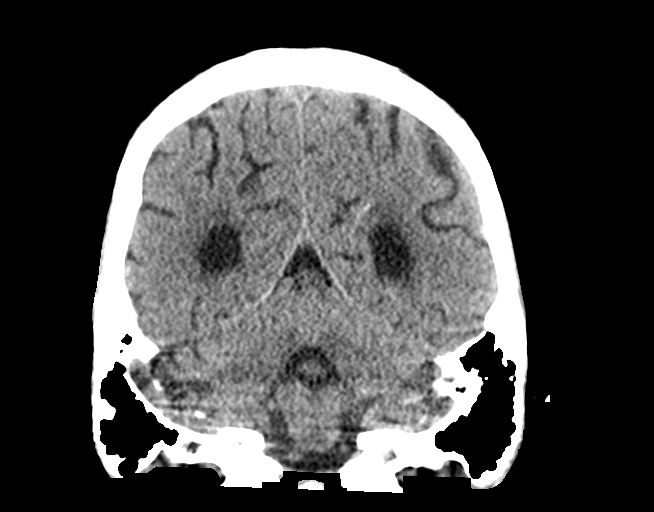
[im 30/68  brain]
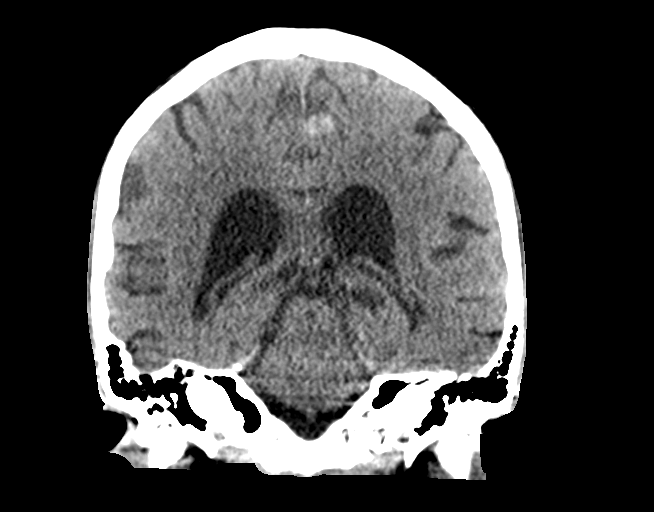
[im 38/68  brain]
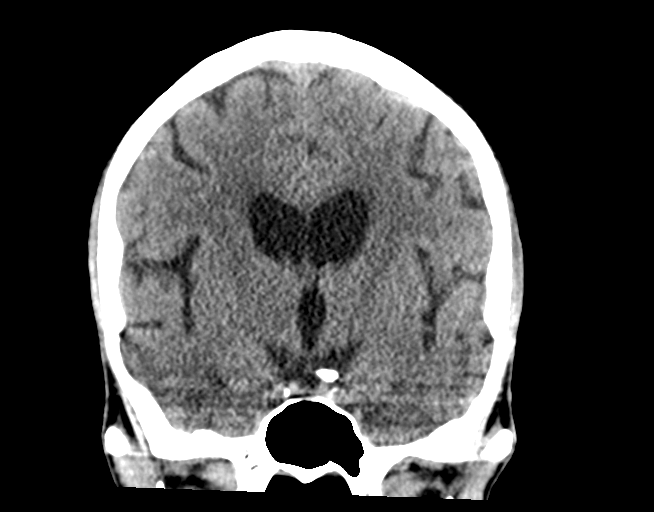

[Series 5: sagittal soft tissue · sagittal · 0.37mm/px · 3 of 51 slices shown]
[im 17/51  brain]
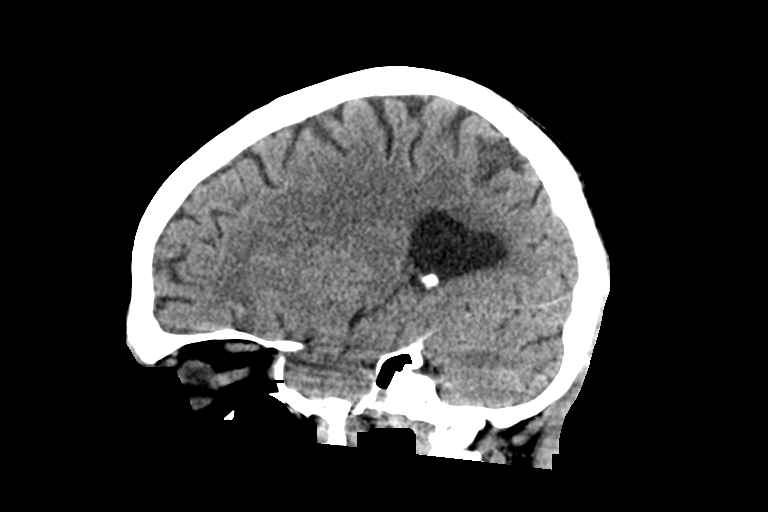
[im 26/51  brain]
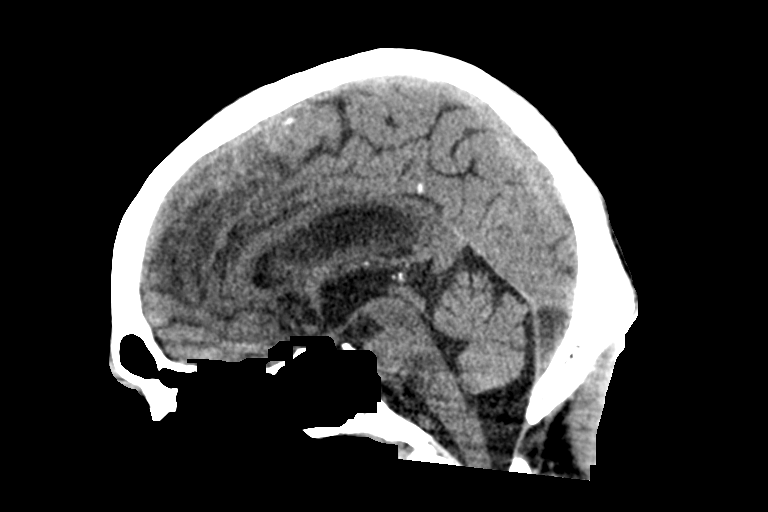
[im 34/51  brain]
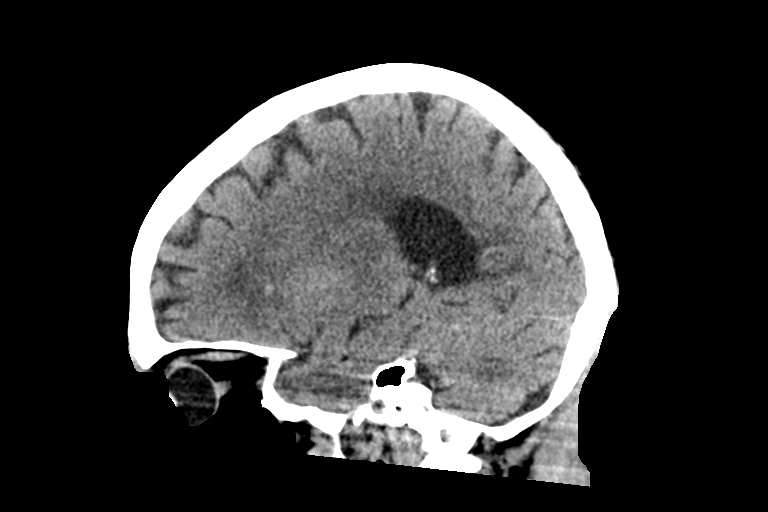

[14 of 47 positions shown; findings below may reference images not displayed]

FINDINGS: Brain: Interval increase in the size of a hyperdense, hemorrhagic
lesion in the left caudate head, now measuring up to 15 mm,
previously 11 mm, with adjacent hypodensity, likely edema. Left
posterior frontal lobe lesion, similarly hyperdense, measures up to
10 mm (series 2: Image 25), which was not definitively on the prior
study. Additional hyperdense foci in the right parietal lobe([DATE])
and left occipital lobe ([DATE]), which may represent additional
lesions. No evidence of acute infarction or midline shift. No
hydrocephalus or extra-axial fluid collection. Periventricular white
matter changes, likely the sequela of chronic small vessel ischemic
disease.

Vascular: No hyperdense vessel.

Skull: Normal. Negative for fracture or focal lesion.

Sinuses/Orbits: No acute finding.

Other: The mastoid air cells are well aerated.
IMPRESSION: IMPRESSION
Interval increase in the size of a hemorrhagic lesion in the left
caudate head with adjacent edema, with additional lesions seen in
the posterior frontal lobe and possible lesions in the right
parietal and left occipital lobes which remain concerning for
hemorrhagic metastatic disease. Recommend MRI with and without
contrast for further evaluation.
